# Patient Record
Sex: Female | Born: 1950
Health system: Southern US, Community
[De-identification: ages and names within clinical notes are randomized; demographics above are authoritative.]

## PROBLEM LIST (undated history)

## (undated) DIAGNOSIS — I10 Essential (primary) hypertension: Secondary | ICD-10-CM

## (undated) DIAGNOSIS — K219 Gastro-esophageal reflux disease without esophagitis: Secondary | ICD-10-CM

## (undated) DIAGNOSIS — D126 Benign neoplasm of colon, unspecified: Secondary | ICD-10-CM

## (undated) DIAGNOSIS — H269 Unspecified cataract: Secondary | ICD-10-CM

## (undated) DIAGNOSIS — E785 Hyperlipidemia, unspecified: Secondary | ICD-10-CM

## (undated) DIAGNOSIS — C801 Malignant (primary) neoplasm, unspecified: Secondary | ICD-10-CM

## (undated) DIAGNOSIS — J45909 Unspecified asthma, uncomplicated: Secondary | ICD-10-CM

## (undated) DIAGNOSIS — K52832 Lymphocytic colitis: Secondary | ICD-10-CM

## (undated) DIAGNOSIS — T7840XA Allergy, unspecified, initial encounter: Secondary | ICD-10-CM

## (undated) DIAGNOSIS — K5790 Diverticulosis of intestine, part unspecified, without perforation or abscess without bleeding: Secondary | ICD-10-CM

## (undated) HISTORY — PX: APPENDECTOMY: SHX54

## (undated) HISTORY — DX: Gastro-esophageal reflux disease without esophagitis: K21.9

## (undated) HISTORY — DX: Unspecified asthma, uncomplicated: J45.909

## (undated) HISTORY — DX: Lymphocytic colitis: K52.832

## (undated) HISTORY — DX: Malignant (primary) neoplasm, unspecified: C80.1

## (undated) HISTORY — PX: TONSILLECTOMY: SUR1361

## (undated) HISTORY — DX: Benign neoplasm of colon, unspecified: D12.6

## (undated) HISTORY — DX: Unspecified cataract: H26.9

## (undated) HISTORY — PX: SHOULDER SURGERY: SHX246

## (undated) HISTORY — DX: Hyperlipidemia, unspecified: E78.5

## (undated) HISTORY — DX: Diverticulosis of intestine, part unspecified, without perforation or abscess without bleeding: K57.90

## (undated) HISTORY — DX: Allergy, unspecified, initial encounter: T78.40XA

## (undated) HISTORY — PX: COLONOSCOPY: SHX174

## (undated) HISTORY — DX: Essential (primary) hypertension: I10

---

## 1999-04-29 ENCOUNTER — Encounter: Payer: Self-pay | Admitting: Internal Medicine

## 1999-04-29 ENCOUNTER — Ambulatory Visit (HOSPITAL_COMMUNITY): Admission: RE | Admit: 1999-04-29 | Discharge: 1999-04-29 | Payer: Self-pay | Admitting: Internal Medicine

## 1999-04-29 ENCOUNTER — Other Ambulatory Visit: Admission: RE | Admit: 1999-04-29 | Discharge: 1999-04-29 | Payer: Self-pay | Admitting: Obstetrics and Gynecology

## 1999-04-30 ENCOUNTER — Other Ambulatory Visit: Admission: RE | Admit: 1999-04-30 | Discharge: 1999-04-30 | Payer: Self-pay | Admitting: Obstetrics and Gynecology

## 1999-04-30 ENCOUNTER — Encounter (INDEPENDENT_AMBULATORY_CARE_PROVIDER_SITE_OTHER): Payer: Self-pay

## 1999-05-11 ENCOUNTER — Encounter: Payer: Self-pay | Admitting: Obstetrics and Gynecology

## 1999-05-13 ENCOUNTER — Encounter (INDEPENDENT_AMBULATORY_CARE_PROVIDER_SITE_OTHER): Payer: Self-pay | Admitting: Specialist

## 1999-05-13 ENCOUNTER — Inpatient Hospital Stay (HOSPITAL_COMMUNITY): Admission: RE | Admit: 1999-05-13 | Discharge: 1999-05-16 | Payer: Self-pay | Admitting: Obstetrics and Gynecology

## 1999-06-24 ENCOUNTER — Other Ambulatory Visit: Admission: RE | Admit: 1999-06-24 | Discharge: 1999-06-24 | Payer: Self-pay | Admitting: Obstetrics and Gynecology

## 1999-10-19 ENCOUNTER — Other Ambulatory Visit: Admission: RE | Admit: 1999-10-19 | Discharge: 1999-10-19 | Payer: Self-pay | Admitting: Obstetrics and Gynecology

## 1999-10-26 ENCOUNTER — Encounter (INDEPENDENT_AMBULATORY_CARE_PROVIDER_SITE_OTHER): Payer: Self-pay

## 1999-10-26 ENCOUNTER — Other Ambulatory Visit: Admission: RE | Admit: 1999-10-26 | Discharge: 1999-10-26 | Payer: Self-pay | Admitting: Gastroenterology

## 2000-02-17 ENCOUNTER — Other Ambulatory Visit: Admission: RE | Admit: 2000-02-17 | Discharge: 2000-02-17 | Payer: Self-pay | Admitting: Obstetrics and Gynecology

## 2000-07-18 ENCOUNTER — Other Ambulatory Visit: Admission: RE | Admit: 2000-07-18 | Discharge: 2000-07-18 | Payer: Self-pay | Admitting: Obstetrics and Gynecology

## 2001-01-03 HISTORY — PX: ABDOMINAL HYSTERECTOMY: SHX81

## 2001-01-03 HISTORY — PX: UPPER GASTROINTESTINAL ENDOSCOPY: SHX188

## 2001-05-25 ENCOUNTER — Other Ambulatory Visit: Admission: RE | Admit: 2001-05-25 | Discharge: 2001-05-25 | Payer: Self-pay | Admitting: Obstetrics and Gynecology

## 2001-08-02 ENCOUNTER — Ambulatory Visit (HOSPITAL_BASED_OUTPATIENT_CLINIC_OR_DEPARTMENT_OTHER): Admission: RE | Admit: 2001-08-02 | Discharge: 2001-08-03 | Payer: Self-pay | Admitting: Orthopedic Surgery

## 2003-06-05 ENCOUNTER — Ambulatory Visit (HOSPITAL_COMMUNITY): Admission: RE | Admit: 2003-06-05 | Discharge: 2003-06-05 | Payer: Self-pay | Admitting: Gastroenterology

## 2003-06-21 ENCOUNTER — Encounter: Admission: RE | Admit: 2003-06-21 | Discharge: 2003-06-21 | Payer: Self-pay | Admitting: Orthopedic Surgery

## 2003-07-14 ENCOUNTER — Encounter: Admission: RE | Admit: 2003-07-14 | Discharge: 2003-07-14 | Payer: Self-pay | Admitting: Orthopedic Surgery

## 2003-08-14 ENCOUNTER — Ambulatory Visit (HOSPITAL_BASED_OUTPATIENT_CLINIC_OR_DEPARTMENT_OTHER): Admission: RE | Admit: 2003-08-14 | Discharge: 2003-08-14 | Payer: Self-pay | Admitting: Orthopedic Surgery

## 2003-08-14 ENCOUNTER — Ambulatory Visit (HOSPITAL_COMMUNITY): Admission: RE | Admit: 2003-08-14 | Discharge: 2003-08-14 | Payer: Self-pay | Admitting: Orthopedic Surgery

## 2003-08-19 ENCOUNTER — Emergency Department (HOSPITAL_COMMUNITY): Admission: EM | Admit: 2003-08-19 | Discharge: 2003-08-19 | Payer: Self-pay | Admitting: Emergency Medicine

## 2004-05-14 ENCOUNTER — Ambulatory Visit: Payer: Self-pay | Admitting: Internal Medicine

## 2004-12-10 ENCOUNTER — Ambulatory Visit: Payer: Self-pay | Admitting: Internal Medicine

## 2005-01-27 ENCOUNTER — Ambulatory Visit: Payer: Self-pay | Admitting: Gastroenterology

## 2005-12-22 ENCOUNTER — Ambulatory Visit: Payer: Self-pay | Admitting: Internal Medicine

## 2005-12-22 LAB — CONVERTED CEMR LAB
Hgb A1c MFr Bld: 6.1 % — ABNORMAL HIGH (ref 4.6–6.0)
Potassium: 3.9 meq/L (ref 3.5–5.1)

## 2006-05-05 LAB — CONVERTED CEMR LAB: OCCULT 1: NEGATIVE

## 2006-05-06 LAB — CONVERTED CEMR LAB: OCCULT 3: NEGATIVE

## 2006-05-07 LAB — CONVERTED CEMR LAB: OCCULT 2: NEGATIVE

## 2006-05-15 ENCOUNTER — Ambulatory Visit: Payer: Self-pay | Admitting: Internal Medicine

## 2006-09-07 ENCOUNTER — Encounter: Payer: Self-pay | Admitting: Internal Medicine

## 2006-10-18 ENCOUNTER — Telehealth (INDEPENDENT_AMBULATORY_CARE_PROVIDER_SITE_OTHER): Payer: Self-pay | Admitting: *Deleted

## 2007-02-15 ENCOUNTER — Telehealth (INDEPENDENT_AMBULATORY_CARE_PROVIDER_SITE_OTHER): Payer: Self-pay | Admitting: *Deleted

## 2007-02-16 ENCOUNTER — Telehealth (INDEPENDENT_AMBULATORY_CARE_PROVIDER_SITE_OTHER): Payer: Self-pay | Admitting: *Deleted

## 2007-02-20 ENCOUNTER — Ambulatory Visit: Payer: Self-pay | Admitting: Internal Medicine

## 2007-02-20 DIAGNOSIS — E8881 Metabolic syndrome: Secondary | ICD-10-CM | POA: Insufficient documentation

## 2007-02-20 DIAGNOSIS — K209 Esophagitis, unspecified without bleeding: Secondary | ICD-10-CM | POA: Insufficient documentation

## 2007-02-20 DIAGNOSIS — E782 Mixed hyperlipidemia: Secondary | ICD-10-CM

## 2007-02-20 DIAGNOSIS — J45909 Unspecified asthma, uncomplicated: Secondary | ICD-10-CM

## 2007-02-20 LAB — CONVERTED CEMR LAB
Cholesterol, target level: 200 mg/dL
HDL goal, serum: 40 mg/dL

## 2007-05-31 ENCOUNTER — Encounter: Payer: Self-pay | Admitting: Internal Medicine

## 2007-10-30 ENCOUNTER — Ambulatory Visit: Payer: Self-pay | Admitting: Internal Medicine

## 2008-01-03 ENCOUNTER — Telehealth (INDEPENDENT_AMBULATORY_CARE_PROVIDER_SITE_OTHER): Payer: Self-pay | Admitting: *Deleted

## 2008-01-14 ENCOUNTER — Telehealth (INDEPENDENT_AMBULATORY_CARE_PROVIDER_SITE_OTHER): Payer: Self-pay | Admitting: *Deleted

## 2008-01-15 ENCOUNTER — Encounter: Payer: Self-pay | Admitting: Internal Medicine

## 2008-06-06 ENCOUNTER — Telehealth (INDEPENDENT_AMBULATORY_CARE_PROVIDER_SITE_OTHER): Payer: Self-pay | Admitting: *Deleted

## 2009-01-26 ENCOUNTER — Encounter (INDEPENDENT_AMBULATORY_CARE_PROVIDER_SITE_OTHER): Payer: Self-pay | Admitting: *Deleted

## 2009-01-27 ENCOUNTER — Ambulatory Visit: Payer: Self-pay | Admitting: Internal Medicine

## 2009-01-27 DIAGNOSIS — I10 Essential (primary) hypertension: Secondary | ICD-10-CM | POA: Insufficient documentation

## 2009-02-19 ENCOUNTER — Telehealth: Payer: Self-pay | Admitting: Internal Medicine

## 2010-01-24 ENCOUNTER — Encounter: Payer: Self-pay | Admitting: Orthopedic Surgery

## 2010-02-02 NOTE — Letter (Signed)
Summary: Primary Care Appointment Letter  [facility]  [address] [place], [place] [address]   Phone: [phone]  Fax: [phone]    01/26/2009 MRN: [id number]  [facility] [address] [place], [place]  [address]  Dear Ms. Merriweather,   Your Primary Care Physician Marga Melnick MD has indicated that:    ____X__it is time to schedule an appointment (Please call to schedule a Physical and fasting labs) An appointment is necessary to continue refilling medications.    _______you missed your appointment on______ and need to call and          reschedule.    _______you need to have lab work done.    _______you need to schedule an appointment discuss lab or test results.    _______you need to call to reschedule your appointment that is                       scheduled on _________.     Please call our office as soon as possible. Our phone number is [phone]. Please press option 1. Our office is open 8a-12noon and 1p-5p, Monday through Friday.     Thank you,     Primary Care Scheduler

## 2010-02-02 NOTE — Assessment & Plan Note (Signed)
Summary: MEDICATIONS RENEWAL/RH......   Vital Signs:  Patient profile:   60 year old female Height:      69 inches Weight:      289.8 pounds BMI:     42.95 Temp:     98.2 degrees F oral Pulse rate:   83 / minute Resp:     16 per minute BP sitting:   128 / 86  (left arm) Cuff size:   large  Vitals Entered By: Shonna Chock (January 27, 2009 3:33 PM) CC: Medication Renewal-patient refused EKG, Hypertension Management, Lipid Management, Abdominal Pain Comments REVIEWED MED LIST, PATIENT AGREED DOSE AND INSTRUCTION CORRECT    CC:  Medication Renewal-patient refused EKG, Hypertension Management, Lipid Management, and Abdominal Pain.  History of Present Illness: BP @  home 130+/ 87-88;she has been off ACE-I for 2 months. She takes ACE-i when work  stress elevates it. edema only with BP elevation vs due to prolonged sittiing due to work schedule. On no statins: "I don't want to". Last A1c 6.1% in 2007. No lipid assessment for > 4 years. No specific diet; walking 2-3X/week irregularly. Some EIB only with cold weather exposure. No rescue inhaler use > 1X/month.  Dyspepsia History:      There is a prior history of GERD.  The patient does not have a prior history of documented ulcer disease.  The dominant symptom is heartburn or acid reflux.  An H-2 blocker medication is not currently being taken.  She has no history of a positive H. Pylori serology.  A prior EGD has been done which showed moderate or severe esophagitis.    Hypertension History:      She denies headache, chest pain, palpitations, dyspnea with exertion, orthopnea, PND, peripheral edema, visual symptoms, neurologic problems, syncope, and side effects from treatment.  She notes no problems with any antihypertensive medication side effects.        Positive major cardiovascular risk factors include female age 18 years old or older, hyperlipidemia, hypertension, and family history for ischemic heart disease (males less than 18 years  old).  Negative major cardiovascular risk factors include no history of diabetes and non-tobacco-user status.        Further assessment for target organ damage reveals no history of ASHD, stroke/TIA, or peripheral vascular disease.    Lipid Management History:      Positive NCEP/ATP III risk factors include female age 59 years old or older, family history for ischemic heart disease (males less than 42 years old), and hypertension.  Negative NCEP/ATP III risk factors include no history of early menopause without estrogen hormone replacement, non-diabetic, non-tobacco-user status, no ASHD (atherosclerotic heart disease), no prior stroke/TIA, no peripheral vascular disease, and no history of aortic aneurysm.         Allergies: 1)  ! * Tetanus  Past History:  Past Medical History: colon polyps asthma Metabolic Syndrome GERD Hyperlipidemia: NMR 05/14/2004 : LDL 106 ( 1495/ 780), TG 183, HDL 41 Hypertension  Past Surgical History: Hysterectomy & BSO  for uterine cancer, 2003 colonoscopy 2004 : tics & polyps ( tubular adenomas)  ; colonoscopy negative  2008 in [place] Appendectomy Tonsillectomy shoulder surgery 2004,2006  Family History: father MI @ 51, congenital heart disease paternal GF & MGF  colon cancer P aunts  gyn cancer P aunt diabetes  Social History: Never Smoked but second hand smoke Alcohol use-no No diet Occupation:Appraiser  Review of Systems General:  Denies fatigue and weight loss. Eyes:  Denies blurring, double vision, and  vision loss-both eyes. ENT:  Denies difficulty swallowing and hoarseness. CV:  Denies leg cramps with exertion, lightheadness, and near fainting. Resp:  Denies cough, shortness of breath, sputum productive, and wheezing. GI:  Denies abdominal pain, bloody stools, dark tarry stools, and indigestion. Derm:  Denies lesion(s), poor wound healing, and rash. Neuro:  Denies poor balance and tingling; No burning. Psych:  Denies anxiety and  depression. Endo:  Denies cold intolerance, excessive hunger, excessive thirst, excessive urination, and heat intolerance.  Physical Exam  General:  well-nourished,in no acute distress; alert,appropriate and cooperative throughout examination; waist > 35 inches Eyes:  No corneal or conjunctival inflammation noted.  Perrla. No icterus Mouth:  Oral mucosa and oropharynx without lesions or exudates.  Teeth in good repair. no pharyngeal erythema.   Chest Wall:  No deformities, masses, or tenderness noted. Lungs:  Normal respiratory effort, chest expands symmetrically. Lungs are clear to auscultation, no crackles or wheezes. Heart:  Normal rate and regular rhythm. S1 and S2 normal without gallop, murmur, click, rub. S4 Abdomen:  Bowel sounds positive,abdomen soft and non-tender without masses, organomegaly or hernias noted. Protuberant Pulses:  R and L carotid,radial,dorsalis pedis and posterior tibial pulses are full and equal bilaterally Extremities:  No clubbing, cyanosis, edema. Neurologic:  alert & oriented X3 and sensation intact to light touch over feet.   Skin:  Intact without suspicious lesions or rashes Cervical Nodes:  No lymphadenopathy noted Axillary Nodes:  No palpable lymphadenopathy Psych:  memory intact for recent and remote, normally interactive, and good eye contact.     Impression & Recommendations:  Problem # 1:  HYPERTENSION (ICD-401.9) controlled Her updated medication list for this problem includes:    Benazepril Hcl 40 Mg Tabs (Benazepril hcl) .Marland Kitchen... Take 1 tab once daily goal = average bp < 135/85  Problem # 2:  ASTHMA (ICD-493.90) controlled ; cold induced EIB  Her updated medication list for this problem includes:    Maxair Autohaler 200 Mcg/inh Aerb (Pirbuterol acetate) .Marland Kitchen... 1-2 puffs q 4 hours prn    Qvar 80 Mcg/act Aers (Beclomethasone dipropionate) .Marland Kitchen... 2 puffs every 12 hrs  Problem # 3:  DYSMETABOLIC SYNDROME X (ICD-277.7) pre Diabetes  Problem # 4:   GERD (ICD-530.1) controlled with PPI Her updated medication list for this problem includes:    Aciphex 20 Mg Tbec (Rabeprazole sodium) .Marland Kitchen... 1 q am (failed omeprazole)  Complete Medication List: 1)  Benazepril Hcl 40 Mg Tabs (Benazepril hcl) .... Take 1 tab once daily goal = average bp < 135/85 2)  Flonase 50 Mcg/act Susp (Fluticasone propionate) .... Takes 1 spray each nostril qd 3)  Maxair Autohaler 200 Mcg/inh Aerb (Pirbuterol acetate) .Marland Kitchen.. 1-2 puffs q 4 hours prn 4)  Qvar 80 Mcg/act Aers (Beclomethasone dipropionate) .... 2 puffs every 12 hrs 5)  Aciphex 20 Mg Tbec (Rabeprazole sodium) .Marland Kitchen.. 1 q am (failed omeprazole)  Hypertension Assessment/Plan:      The patient's hypertensive risk group is category B: At least one risk factor (excluding diabetes) with no target organ damage.  Today's blood pressure is 128/86.    Lipid Assessment/Plan:      Based on NCEP/ATP III, the patient's risk factor category is "2 or more risk factors and a calculated 10 year CAD risk of > 20%".  The patient's lipid goals are as follows: Total cholesterol goal is 200; LDL cholesterol goal is 160; HDL cholesterol goal is 40; Triglyceride goal is 150.  Her LDL cholesterol goal has not been met.  Secondary causes for  hyperlipidemia have been ruled out.  She has been counseled on adjunctive measures for lowering her cholesterol and has been provided with dietary instructions.    Patient Instructions: 1)  Consume LESS THAN 25 grams of sugar / day from labeled foods & drinks with High Fructose Corn Syrup as #1, 2 or #3 on label. Please schedule fasting labs: 2)  BUN,creat, K+ , ICD-9: 401.9 3)  AST, ALT, ICD-9:995.20 4)  Lipid Panel, ICD-9:272.4 5)  HbgA1C , ICD-9:277.7 6)  Avoid foods high in acid (tomatoes, citrus juices, spicy foods). Avoid eating within two hours of lying down or before exercising. Do not over eat; try smaller more frequent meals. Elevate head of bed twelve inches when  sleeping. Prescriptions: ACIPHEX 20 MG TBEC (RABEPRAZOLE SODIUM) 1 q am (FAILED Omeprazole) Brand medically necessary #30 Tablet x 11   Entered and Authorized by:   Marga Melnick MD   Signed by:   Marga Melnick MD on 01/27/2009   Method used:   Print then Give to Patient   RxID:   [id number] QVAR 80 MCG/ACT AERS (BECLOMETHASONE DIPROPIONATE) 2 puffs every 12 hrs  #1 x 11   Entered and Authorized by:   Marga Melnick MD   Signed by:   Marga Melnick MD on 01/27/2009   Method used:   Print then Give to Patient   RxID:   [id number] MAXAIR AUTOHALER 200 MCG/INH  AERB (PIRBUTEROL ACETATE) 1-2 puffs q 4 hours prn  #1 x 3   Entered and Authorized by:   Marga Melnick MD   Signed by:   Marga Melnick MD on 01/27/2009   Method used:   Print then Give to Patient   RxID:   [id number] FLONASE 50 MCG/ACT  SUSP (FLUTICASONE PROPIONATE) takes 1 spray each nostril qd  #1 x 11   Entered and Authorized by:   Marga Melnick MD   Signed by:   Marga Melnick MD on 01/27/2009   Method used:   Print then Give to Patient   RxID:   [id number] BENAZEPRIL HCL 40 MG  TABS (BENAZEPRIL HCL) Take 1 tab once daily goal = AVERAGE BP < 135/85  #90 Tablet x 3   Entered and Authorized by:   Marga Melnick MD   Signed by:   Marga Melnick MD on 01/27/2009   Method used:   Print then Give to Patient   RxID:   [id number]

## 2010-02-02 NOTE — Progress Notes (Signed)
Summary: Triage: Request for ABX & Prednisone  Phone Note Call from Patient Call back at Home Phone [phone]   Caller: Patient Summary of Call: Onset last weekend patient was working in [place] and inhaled dirt/dust now bronchial tubes appear inflammed. Patient thinks there is some infection. Patient would like rx called in, patient said in the past she was rx'ed prednisone and ABX. Patient was offered an appointment and refused. Patient indicated that she is still trying to figure out how she is going to pay for her last visit and would really appreciate it if rx can be called in to pharmacy: [facility]  **Dr. Alwyn Ren please advise**  .Shonna Chock  February 19, 2009 9:59 AM   Follow-up for Phone Call        I'm sorry but I can not treat by phone; it is not a Standard of Care. I'll be glad to see her  or she can go to  [facility] or [address] Follow-up by: Marga Melnick MD,  February 19, 2009 1:35 PM  Additional Follow-up for Phone Call Additional follow up Details #1::        called pt back to inform pt of dr Leiya Keesey suggestion pt  refuse OV and did not given time for other alternatives to be suggested before hanging up phone............................Marland KitchenFelecia Deloach CMA  February 19, 2009 2:07 PM

## 2010-02-25 ENCOUNTER — Encounter: Payer: Self-pay | Admitting: Internal Medicine

## 2010-04-06 ENCOUNTER — Encounter: Payer: Self-pay | Admitting: Internal Medicine

## 2010-05-21 NOTE — Discharge Summary (Signed)
[facility]  Patient:    Sheila Tucker, Sheila Tucker                MRN: [id number] Adm. Date:  [id number] Disc. Date: [id number] Attending:  Malon Kindle                           Discharge Summary  DISCHARGE DIAGNOSES: 1. Adenocarcinoma of the endometrium. 2. Status post total abdominal hysterectomy and bilateral salpingo-    oophorectomy. 3. Status post removal of pelvic mass. 4. Acute urinary retention postoperatively.  DISCHARGE MEDICATIONS: Motrin 600 mg p.o. every 6h p.r.n. The patient will also resume her asthma medications as taken preoperatively.  FOLLOW-UP:  The patient is to follow-up in our office on May 16 for staple removal as well as possible removal of Foley catheter.  HOSPITAL COURSE:  The patients a 60 year old white female who was diagnosed with well differentiated adenocarcinoma of the endometrium based on endometrial biopsy performed a week prior to surgery. She was counseled appropriately and was admitted for a scheduled total abdominal hysterectomy and bilateral salpingo-oophorectomy and a possible lymph node dissection. The patient underwent this procedure on May 13, 1999 and at that time was also noted to have a well-differentiated mass in the prevesical space consistent with a lipoma; however, tissue was sent to pathology for final diagnosis. She was then admitted for routine postoperative care and did very well. On postoperative day #1, she was afebrile with stable vital signs. Her hematocrit was stable at 36.5 and her abdomen was soft and nontender. She had a trial of voiding and was unable to void, therefore, her Foley catheter was replaced. On postoperative day #2, she began to pass flatus. A Jackson-Pratt drain which had been placed in the prevesical space status post surgery was removed as it was draining less than 20 cc a shift. Again she continued to be afebrile with good vital signs. She had a second trial of  voiding which also failed and her Foley catheter was again replaced. On postoperative day #3, she was tolerating a regular diet, ambulating well. Her pain was well controlled. Her Tmax was 100.3. Abdomen was soft and nontender with good bowel sounds. Her incision was well approximated with staples and she had no erythema or exudate. She was to undergo a trial of voiding prior to discharge and is unable to void 4 hours status post catheter removal. She was to be discharged home with her Foley catheter and leave this in place until Wednesday, May 16 in the office. DD: 05/16/99 TD:  05/18/99 Job: [id number]

## 2010-05-21 NOTE — Consult Note (Signed)
[facility]  Patient:    Sheila Tucker, Sheila Tucker                MRN: [id number] Proc. Date: 05/13/99 Adm. Date:  [id number] Disc. Date: [id number] Attending:  Malon Kindle CC:         Malachi Pro. Ambrose Mantle, M.D.                          Consultation Report  SUBJECTIVE:  The patient is a 60 year old 250 pound female, found to have retropubic fatty mass upon incision for abdominal hysterectomy.  The patient has a history of carcinoma of the uterus and is now intraoperative for hysterectomy.  OBJECTIVE:  The bladder could not be identified.  The Foley catheter was felt to possibly be palpable.  The bladder was filled with saline, as well as indigo carmine given IV and Lasix.  The fatty mass was dissected off the bladder and appears to arise from the superpubic or retropubic area.  This is completely removed in two portions and following this, the bladder is palpable, and there s no evidence of leakage.  This patient then underwent hysterectomy without difficulty.  I believe the superpubic space of Retzius should be drained and this will be accomplished by Dr. Lang Snow. DD:  05/13/99 TD:  05/17/99 Job: [id number]

## 2010-05-21 NOTE — Op Note (Signed)
Sheila Tucker, Sheila Tucker                   ACCOUNT NO.:  [id number]   MEDICAL RECORD NO.:  [id number]                   PATIENT TYPE:  AMB   LOCATION:  DSC                                  FACILITY:  [facility]   PHYSICIAN:  Rodney A. Chaney Malling, M.D.           DATE OF BIRTH:  [birth year]   DATE OF PROCEDURE:  08/14/2003  DATE OF DISCHARGE:                                 OPERATIVE REPORT   PREOPERATIVE DIAGNOSIS:  Loose bodies right shoulder, early osteoarthritis,  possible other internal derangement.   POSTOPERATIVE DIAGNOSIS:  Severe osteoarthritis humeral head with loss of  significant articular cartilage, multiple loose bodies, torn labrum, right  shoulder.   OPERATION:  Debridement labrum, remove loose bodies, chondroplasty of  humeral head, removal of all flaking and torn cartilage off the humeral  head.   SURGEON:  Lenard Galloway. Chaney Malling, M.D.   ANESTHESIA:  General.   PROCEDURE:  The patient was placed on the operating table in supine  position.  After satisfactory oral endotracheal anesthesia, the patient was  placed in a semi-seated position.  The shoulder and left upper extremity was  prepped with DuraPrep and draped out in the usual manner.  The arthroscope  was introduced into the posterior portal and an anterior portal was made for  the operative instruments.  The patient had significant tearing and fraying  of the anterior labrum.  The biceps was also frayed.  There was grade 3  cartilage damage over the articular surface of the glenoid and the humeral  head had huge areas where there was total loss of articular cartilage.  The  junction of the articular cartilage to the raw area had flanking of  cartilage.  The intra-articular shaver was introduced.  The humeral head was  debrided with a chondroplasty shear.  All loose cartilage was debrided.  The  anterior labrum and under surface of the biceps was also debrided with the  intra-articular shaver.  The loose bodies in  the shoulder were removed.  There was one loose body in the inferior recess on a pedicle that was not  accessible but was not mobile and was stuck inferior in the nonarticular  portion of the joint.  A great deal of time was spent cleaning out debris  and frayed cartilage.  The shoulder was then filled with Marcaine, sterile  dressings were applied, and the patient returned to the recovery room in  excellent condition.  Technically, this procedure went extremely well.  Drains were none.  Complications none.                                               Rodney A. Chaney Malling, M.D.    RAM/MEDQ  D:  08/14/2003  T:  08/14/2003  Job:  [id number]

## 2010-05-21 NOTE — Op Note (Signed)
[facility]  Patient:    Sheila Tucker, Sheila Tucker                MRN: [id number] Proc. Date: 05/13/99 Adm. Date:  [id number] Disc. Date: [id number] Attending:  Malon Kindle CC:         Sigmund I. Patsi Sears, M.D.             Adolph Pollack, M.D.                           Operative Report  PREOPERATIVE DIAGNOSIS:  Well-differentiated adenocarcinoma of the endometrium.  POSTOPERATIVE DIAGNOSES:  Well-differentiated adenocarcinoma of the endometrium with superficial invasion of the myometrium by frozen section, well-differentiated tumor.  Large prevesical space lipoma.  OPERATORS: Malachi Pro. Ambrose Mantle, M.D. Sigmund I. Patsi Sears, M.D.  ASSISTANT:  Alvino Chapel, M.D.  ANESTHESIA:  General.  DESCRIPTION OF PROCEDURE:  Patient was brought to the operating room and placed under satisfactory general anesthesia.  She was placed in frog-leg position.  The abdomen, vulva, vagina and urethra was prepped with Betadine solution and draped as a sterile field.  Exam revealed a suggestion of a lower abdominal mass, although this had been thought to be present preoperatively and an ultrasound showed no pelvic masses.  Patient was then placed supine, after the uterus was thought to be anterior and normal size and the adnexa were free of masses.  The abdomen was prepped with Betadine solution all the way to the xiphoid in case an aortic node dissection was necessary.  The abdomen was draped as sterile field and a midline incision was made and carried in layers through the skin, subcutaneous tissue and fascia and then I attempted to enter the abdominal cavity; it was not easy to do so.  Finally, I did make an entry up close to the umbilicus and it appeared that the anterior pelvic peritoneum was actually close to the umbilicus.  The explanation for this was that there was a large fatty tumor that was arising from the prevesical space.  I dissected some  of this but did not persist in my efforts to dissect it because it was quite close to the bladder, so I called Dr. Patsi Sears, so for the next 25 minutes, I did not do any dissection and just waited for him to arrive from his surgery.  He scrubbed in and with blunt dissection, dissected this large fatty tumor away from its attachments and it seemed to be basically attached to the inner surface of the pubic symphysis. This mass was actually larger than a normal placenta at full-term.  There was minimal bleeding and now the bladder blade of the abdominal retractor could be used for exposure.  Packs were used to pack away the bowel.  The liver was smooth.  I thought I felt the gallbladder and it felt normal.  Both kidneys felt normal.  Exploration of the pelvis revealed the uterus to be anterior, probably three times normal size for a woman who had never been pregnant and was 60 years old.  Both ovaries appeared normal.  There seemed to be a corpus luteum on the left.  Both tubes appeared normal.  Pelvic washings were obtained and then the upper pedicles of the uterus were clamped across.  The infundibulopelvic ligaments bilaterally were suture-ligated twice and then the infundibulopelvic ligaments were cut.  The round ligaments bilaterally were cut, on the right with the Bovie, the left with suture; actually,  the left round ligament was secured with a suture after it had cut.  The bladder flap was developed, the bladder was pushed inferiorly, the parametrial and paracervical tissues were clamped, cut and suture-ligated.  It should be noted that Dr. Patsi Sears had had IV indigo carmine placed and then it filled the bladder up and there was no leakage.  The uterosacral ligaments were clamped, cut and suture-ligated and held.  The right vaginal angle was entered and then the uterus, tubes and ovaries were removed by transecting the upper vagina. Vaginal angle sutures were placed and then the  central portion of the cuff was closed with interrupted figure-of-eight sutures of 0 Vicryl.  Liberal irrigation confirmed a couple of bleeding points that were controlled with the Bovie.  I inspected both infundibulopelvic ligaments; the left was hemostatic; the right seemed to have a little bleeding, so I reclamped it and suture-ligated it and made it hemostatic.  The uterosacral ligaments were sutured together in the midline.  Reperitonealization was done across the vaginal cuff.  At this point, the uterine frozen section came back showing less than one-third invasion into the myometrium and well-differentiated tumor, so I called Dr. Adolph Pollack and asked him to no longer be on standby.  At this point, all packs and retractors were removed.  The peritoneum was closed with a running suture of 0 Vicryl.  Dr. Patsi Sears had requested that we place a Jackson-Pratt drainage tube in the prevesical space. I made a stab wound inferior to the incision in the left lower quadrant, placed a Kelly into the prevesical space and pulled the suction drain out through this.  I then began the closure of the fascia and I realized that the incision had gone in a little too close to the fascial edge on the left, so I replaced the subfascial drain farther away from the fascial edge.  I closed the abdominal wall fascia with multiple interrupted figure-of-eight sutures of #1 Novofil; the closure seemed to be quite good.  Liberal irrigation confirmed hemostasis in the subcutaneous space.  I closed the subcu tissue with a running 3-0 Vicryl and closed the skin with staples.  The patient seemed to tolerate the procedure well.  Blood loss was about 400 cc.  Sponge and needle counts were correct.  She was returned to recovery in satisfactory condition after the abdominal wall was cleansed of its Betadine, a sterile dressing was placed over the incision and the Jackson-Pratt tube had been hooked to  Hemovac drainage. DD:  05/13/99 TD:  05/17/99 Job: [id number]

## 2010-05-21 NOTE — H&P (Signed)
[facility]  Patient:    Sheila Tucker, Sheila Tucker                MRN: [id number] Adm. Date:  [id number] Attending:  Malon Kindle CC:         Titus Dubin. Alwyn Ren, M.D. [facility]             Adolph Pollack, M.D.                         History and Physical  HISTORY OF PRESENT ILLNESS:  This is a 60 year old white single female, para 0 admitted to the hospital for abdominal hysterectomy, bilateral salpingo-oophorectomy, possible lymph node biopsies because of well differentiated adenocarcinoma of the endometrium on endometrial biopsy. This patient states that she had normal periods until November of 2000. On November 18, 1998, she began bleeding and when I saw her on April 29, 1999, she stated that she had not missed a day without bleeding in the last 5 months. She was examined and there was a possibility of an abdominal mass. An endometrial biopsy was done that showed well differentiated FIGO grade 1 endometrial adenocarcinoma. She was advised hysterectomy and is admitted now for that procedure. She has never had a mammogram. She has only had sex 5 times in her life. Her last Pap had been 2 years prior to the examination. The Pap smear on April 26 was within normal limits. She has had asthma since age 39. She was placed on medications in her 30s.  PAST MEDICAL HISTORY:  She did have an appendectomy in childhood, tonsillectomy in childhood. Medical illnesses negative except for asthma. Until November of 2000, her periods had been 28 days apart lasting 6 to 7 days with moderate flow and no significant pain. She has no history of pregnancies.  ALLERGIES:  No known drug allergies.  MEDICATIONS:  Vanceril and Maxair.  FAMILY HISTORY:  Her father had a heart attack and heart disease and high blood pressure and cancer. Her mother had some type of nervous disease and at the present time her mother had dementia.  SOCIAL HISTORY:  The patient is single. 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  REVIEW OF SYSTEMS:  Positive for chronic respirator problems from asthma. She also claims to have urinary incontinence with a strong laugh. She reported this to me on the day of her preoperative examination, and I have told her that we will address that in the future that she will possibly need to undergo urologic studies, but we will not do anything about that at the time of the surgery.  PHYSICAL EXAMINATION:  VITAL SIGNS:  The patient is 5 feet 9 inches, 248 pounds. Blood pressure is 150/90.  HEENT:  Revealed no cranial abnormalities. Head, eyes, ears, nose and throat show the eyes to have good movements. The pharynx is clear. The teeth are in fair repair.  NECK:  Supple without thyromegaly.  HEART:  Normal size and sounds.  LUNGS:  Show occasion inspiratory wheezes. No significant expiratory wheezes. Air seems to move okay.  ABDOMEN:  Soft. It is obese. There is a suggestion of a lower abdominal mass but this did not prove to be true on ultrasound.  BREASTS:  Soft without masses lying down or sitting up.  PELVIC: The external genitalia are normal. There is no blood in the vagina today. The cervix is clean. The uterus is difficult to feel but thought to  be normal size. Adnexa are clear.  RECTAL:  On April 29, 1999 was negative.  ADMITTING IMPRESSION:  Well differentiated endometrial adenocarcinoma. Asthma. The patient is admitted for abdominal hysterectomy and bilateral salpingo-oophorectomy. If lymph node biopsies are indicated, Dr. Avel Peace will perform the lymph node biopsies. The patient understands the risks of surgery include but are not limited to heart attack, stroke, pulmonary embolus, wound disruption, hemorrhage with need for reoperation and/or transfusion, fistula formation, nerve injury, intestinal obstruction. She understands and agrees to proceed. DD:  05/12/99 TD:  05/13/99 Job:  [id number]

## 2010-05-21 NOTE — Op Note (Signed)
NAME:  Sheila Tucker, Sheila Tucker [facility]                   ACCOUNT NO.:  [id number]   MEDICAL RECORD NO.:  [id number]                   PATIENT TYPE:   LOCATION:                                       FACILITY:   PHYSICIAN:  Rodney A. Chaney Malling, M.D.           DATE OF BIRTH:   DATE OF PROCEDURE:  08/02/2001  DATE OF DISCHARGE:                                 OPERATIVE REPORT   PREOPERATIVE DIAGNOSIS:  Severe osteoarthritis left shoulder, multiple loose  bodies left shoulder, possible SLAP lesion left shoulder.   POSTOPERATIVE DIAGNOSIS:  Severe osteoarthritis humeral head with large  areas of total articular cartilage loss, multiple loose bodies left  shoulder; frayed biceps attachment left shoulder.   OPERATION:  Arthroscopy left shoulder, removal of multiple loose bodies,  chondroplasty of humeral head and removal of all loose cartilage from  humeral head, debridement of biceps attachment left shoulder.   SURGEON:  Lenard Galloway. Chaney Malling, M.D.   ASSISTANT:  Jamelle Rushing, P.A.   ANESTHESIA:  General.   DESCRIPTION OF PROCEDURE:  After satisfactory oral endotracheal anesthesia  the patient was placed on the operative table in a semi-seated position.  The left shoulder and upper extremity was prepped with Duraprep and draped  out in the usual manner.  A posterior portal was placed in the shoulder and  the arthroscope was introduced.  An operative cannula was placed anteriorly.  Extensive evaluation of the shoulder was then undertaken.  There were  multiple cartilaginous loose bodies and bony loose bodies in the shoulder.  There was marked fraying and tearing of the biceps attachment but the biceps  attachment was still stable.  This really represented a grade 1 SLAP lesion.  The articular cartilage above the glenoid was fairly normal but the humeral  head had a huge area of total loss of articular cartilage and the margins of  the articular cartilage reached the bare bone.  These cartilage  fragments  were loose.  The intraarticular shaver was introduced through the anterior  portal.  The loose bodies were the removed individually and completely  decompressed.  Using a full radius resector, the articular cartilage about  the humeral head at the margin of the articular and nonarticular portion was  debrided so all loose cartilage was removed.  A great deal of time was spent  debriding the shoulder.   Attention was then turned to the biceps.  The biceps was palpated with a  probe, and attachment was still stable with just marked fraying and tearing  of this attachment.  This was debrided with a full radius resector.  All  frayed portions of the biceps were removed.  Because of the extensive nature  of the osteoarthritis about the humeral head, it was felt that subacromial  decompression was probably not indicated.  The instruments were removed from  the shoulder, sutures used to close the wound.  Sterile dressings were  applied and the patient returned to the recovery  room in excellent  condition.  Technically, the procedure went extremely well.    ADDENDUM:  This patient probably will require a hemiarthroplasty in the  future.  The humeral head is in severe trouble, with large areas of loss of  articular cartilage although the glenoid still appears fairly adequate.                                               Rodney A. Chaney Malling, M.D.    RAM/MEDQ  D:  08/02/2001  T:  08/08/2001  Job:  [id number]

## 2010-07-09 ENCOUNTER — Ambulatory Visit (INDEPENDENT_AMBULATORY_CARE_PROVIDER_SITE_OTHER): Payer: BC Managed Care – PPO | Admitting: Internal Medicine

## 2010-07-09 ENCOUNTER — Encounter: Payer: Self-pay | Admitting: Internal Medicine

## 2010-07-09 VITALS — BP 124/86 | HR 88 | Temp 98.3°F | Resp 14 | Ht 69.0 in | Wt 278.2 lb

## 2010-07-09 DIAGNOSIS — K5790 Diverticulosis of intestine, part unspecified, without perforation or abscess without bleeding: Secondary | ICD-10-CM | POA: Insufficient documentation

## 2010-07-09 DIAGNOSIS — Z0000 Encounter for general adult medical examination without abnormal findings: Secondary | ICD-10-CM

## 2010-07-09 DIAGNOSIS — K635 Polyp of colon: Secondary | ICD-10-CM | POA: Insufficient documentation

## 2010-07-09 DIAGNOSIS — E8881 Metabolic syndrome: Secondary | ICD-10-CM

## 2010-07-09 DIAGNOSIS — K5730 Diverticulosis of large intestine without perforation or abscess without bleeding: Secondary | ICD-10-CM

## 2010-07-09 DIAGNOSIS — J45909 Unspecified asthma, uncomplicated: Secondary | ICD-10-CM

## 2010-07-09 DIAGNOSIS — D126 Benign neoplasm of colon, unspecified: Secondary | ICD-10-CM

## 2010-07-09 DIAGNOSIS — I10 Essential (primary) hypertension: Secondary | ICD-10-CM

## 2010-07-09 DIAGNOSIS — E782 Mixed hyperlipidemia: Secondary | ICD-10-CM

## 2010-07-09 LAB — HEPATIC FUNCTION PANEL
ALT: 28 U/L (ref 0–35)
AST: 24 U/L (ref 0–37)
Albumin: 4.1 g/dL (ref 3.5–5.2)
Alkaline Phosphatase: 49 U/L (ref 39–117)
Bilirubin, Direct: 0 mg/dL (ref 0.0–0.3)

## 2010-07-09 LAB — CBC WITH DIFFERENTIAL/PLATELET
Basophils Absolute: 0 10*3/uL (ref 0.0–0.1)
Basophils Relative: 0.4 % (ref 0.0–3.0)
Eosinophils Relative: 3.1 % (ref 0.0–5.0)
HCT: 45.4 % (ref 36.0–46.0)
Hemoglobin: 15.5 g/dL — ABNORMAL HIGH (ref 12.0–15.0)
Lymphocytes Relative: 25.3 % (ref 12.0–46.0)
Monocytes Absolute: 0.5 10*3/uL (ref 0.1–1.0)
Platelets: 224 10*3/uL (ref 150.0–400.0)
RBC: 5.08 Mil/uL (ref 3.87–5.11)
RDW: 14.5 % (ref 11.5–14.6)

## 2010-07-09 LAB — BASIC METABOLIC PANEL
BUN: 12 mg/dL (ref 6–23)
Calcium: 8.8 mg/dL (ref 8.4–10.5)
Creatinine, Ser: 0.8 mg/dL (ref 0.4–1.2)
Glucose, Bld: 108 mg/dL — ABNORMAL HIGH (ref 70–99)

## 2010-07-09 LAB — TSH: TSH: 0.92 u[IU]/mL (ref 0.35–5.50)

## 2010-07-09 LAB — LIPID PANEL
LDL Cholesterol: 88 mg/dL (ref 0–99)
Triglycerides: 160 mg/dL — ABNORMAL HIGH (ref 0.0–149.0)
VLDL: 32 mg/dL (ref 0.0–40.0)

## 2010-07-09 MED ORDER — PIRBUTEROL ACETATE 200 MCG/INH IN AERB: 2.0000 | INHALATION_SPRAY | Freq: Four times a day (QID) | RESPIRATORY_TRACT | Status: DC | PRN

## 2010-07-09 MED ORDER — BECLOMETHASONE DIPROPIONATE 80 MCG/ACT IN AERS: 2.0000 | INHALATION_SPRAY | RESPIRATORY_TRACT | Status: DC | PRN

## 2010-07-09 MED ORDER — FLUTICASONE PROPIONATE 50 MCG/ACT NA SUSP: 1.0000 | Freq: Every day | NASAL | Status: DC

## 2010-07-09 NOTE — Patient Instructions (Addendum)
Preventive Health Care: Exercise  30-45  minutes a day, 3-4 days a week. Walking is especially valuable in preventing Osteoporosis. Eat a low-fat diet with lots of fruits and vegetables, up to 7-9 servings per day. Avoid obesity; your goal = waist less than 35 inches.Consume less than 30 grams of sugar per day from foods & drinks with High Fructose Corn Syrup as #2,3 or #4 on label. Eye Doctor - have an eye exam @ least annually As per the Standard of Care , screening Colonoscopy recommended every 3-5 years  As per Gastroenterologist

## 2010-07-09 NOTE — Progress Notes (Signed)
Subjective:    Patient ID: Sheila Tucker, female    DOB: [birth year], 60 y.o.   MRN: [id number]  HPI  Ms. Sedam  is here for a physical; she denies acute issues.      Review of Systems Patient reports no vision/ hearing  changes, adenopathy,fever, weight change,  persistant / recurrent hoarseness , swallowing issues, chest pain,palpitations,persistant /recurrent cough, hemoptysis, dyspnea( rest/ exertional/paroxysmal nocturnal), gastrointestinal bleeding(melena, rectal bleeding), abdominal pain, significant heartburn,  bowel changes,GU symptoms(dysuria, hematuria,pyuria, incontinence) ), Gyn symptoms(abnormal  bleeding , pain),  syncope, focal weakness, memory loss,numbness & tingling, skin/hair /nail changes,abnormal bruising or bleeding, anxiety,or depression.  Edema only after sitting for period or with  [place] food. Some L knee pain after repetitive use;improving without  Rx     Objective:   Physical Exam Gen.: Healthy and well-nourished in appearance. Alert, appropriate and cooperative throughout exam. Head: Normocephalic without obvious abnormalities  Eyes: No corneal or conjunctival inflammation noted. Pupils equal round reactive to light and accommodation. Fundal exam is benign without hemorrhages, exudate, papilledema. Extraocular motion intact. Vision grossly normal with lenses. Ears: External  ear exam reveals no significant lesions or deformities. Canals clear .TMs normal. Hearing is grossly normal bilaterally. Nose: External nasal exam reveals no deformity or inflammation. Nasal mucosa are pink and moist. No lesions or exudates noted. Septum with slight dislocation Mouth: Oral mucosa and oropharynx reveal no lesions or exudates. Teeth in good repair. Neck: No deformities, masses, or tenderness noted. Range of motion & . Thyroid normal. Lungs: Normal respiratory effort; chest expands symmetrically. Lungs are clear to auscultation without rales, wheezes, or increased work of  breathing. Heart: Normal rate and rhythm. Normal S1 and S2. No gallop, click, or rub. S4 with slurring ; no murmur. Abdomen: Bowel sounds normal; abdomen soft and nontender. No masses, organomegaly or hernias noted. Genitalia: Dr Ambrose Mantle    .                                                                                   Musculoskeletal/extremities: No deformity or scoliosis noted of  the thoracic or lumbar spine but R thoracic muscles > L. No clubbing, cyanosis, edema, or deformity noted. Range of motion decreased @ L knee ; crepitus L > R knee.Tone & strength  normal.Joints normal. Nail health  good. Vascular: Carotid, radial artery, dorsalis pedis and  posterior tibial pulses are full and equal. No bruits present. Neurologic: Alert and oriented x3. Deep tendon reflexes symmetrical and normal.          Skin: Intact without suspicious lesions or rashes. Lymph: No cervical, axillary lymphadenopathy present. Psych: Mood and affect are normal. Normally interactive                                                                                         Assessment &  Plan:  #1 comprehensive physical exam; no acute findings #2 see Problem List with Assessments & Recommendations Plan: see Orders

## 2010-07-12 LAB — HEMOGLOBIN A1C: Hgb A1c MFr Bld: 6.4 % (ref 4.6–6.5)

## 2011-03-31 ENCOUNTER — Encounter: Payer: Self-pay | Admitting: Internal Medicine

## 2011-11-04 ENCOUNTER — Encounter: Payer: Self-pay | Admitting: Internal Medicine

## 2011-11-04 ENCOUNTER — Ambulatory Visit (INDEPENDENT_AMBULATORY_CARE_PROVIDER_SITE_OTHER): Payer: BC Managed Care – PPO | Admitting: Internal Medicine

## 2011-11-04 VITALS — BP 124/82 | HR 89 | Temp 98.3°F | Resp 14 | Wt 287.3 lb

## 2011-11-04 DIAGNOSIS — Z0000 Encounter for general adult medical examination without abnormal findings: Secondary | ICD-10-CM

## 2011-11-04 DIAGNOSIS — E8881 Metabolic syndrome: Secondary | ICD-10-CM

## 2011-11-04 DIAGNOSIS — J45909 Unspecified asthma, uncomplicated: Secondary | ICD-10-CM

## 2011-11-04 DIAGNOSIS — I10 Essential (primary) hypertension: Secondary | ICD-10-CM

## 2011-11-04 LAB — CBC WITH DIFFERENTIAL/PLATELET
Basophils Absolute: 0 10*3/uL (ref 0.0–0.1)
Basophils Relative: 0.4 % (ref 0.0–3.0)
Eosinophils Absolute: 0.3 10*3/uL (ref 0.0–0.7)
Eosinophils Relative: 3.9 % (ref 0.0–5.0)
Lymphocytes Relative: 28.6 % (ref 12.0–46.0)
Lymphs Abs: 2.1 10*3/uL (ref 0.7–4.0)
Monocytes Absolute: 0.5 10*3/uL (ref 0.1–1.0)
Monocytes Relative: 7.4 % (ref 3.0–12.0)
Neutrophils Relative %: 59.7 % (ref 43.0–77.0)
RBC: 5.67 Mil/uL — ABNORMAL HIGH (ref 3.87–5.11)
RDW: 14.2 % (ref 11.5–14.6)

## 2011-11-04 LAB — LIPID PANEL
Total CHOL/HDL Ratio: 6
Triglycerides: 188 mg/dL — ABNORMAL HIGH (ref 0.0–149.0)

## 2011-11-04 LAB — HEMOGLOBIN A1C: Hgb A1c MFr Bld: 6.2 % (ref 4.6–6.5)

## 2011-11-04 LAB — HEPATIC FUNCTION PANEL: Total Bilirubin: 0.7 mg/dL (ref 0.3–1.2)

## 2011-11-04 LAB — BASIC METABOLIC PANEL
Chloride: 98 mEq/L (ref 96–112)
Creatinine, Ser: 0.8 mg/dL (ref 0.4–1.2)
Glucose, Bld: 82 mg/dL (ref 70–99)

## 2011-11-04 LAB — LDL CHOLESTEROL, DIRECT: Direct LDL: 144.8 mg/dL

## 2011-11-04 MED ORDER — FLUTICASONE PROPIONATE 50 MCG/ACT NA SUSP: NASAL | Status: DC

## 2011-11-04 MED ORDER — PIRBUTEROL ACETATE 200 MCG/INH IN AERB: 2.0000 | INHALATION_SPRAY | Freq: Four times a day (QID) | RESPIRATORY_TRACT | Status: DC | PRN

## 2011-11-04 MED ORDER — BECLOMETHASONE DIPROPIONATE 80 MCG/ACT IN AERS: 2.0000 | INHALATION_SPRAY | Freq: Two times a day (BID) | RESPIRATORY_TRACT | Status: DC

## 2011-11-04 NOTE — Progress Notes (Signed)
  Subjective:    Patient ID: Sheila Tucker, female    DOB: April 16, 1950, 61 y.o.   MRN: [id number]  HPI  Sheila Tucker  is here for a physical; she denies acute issues.  She is  requesting refills of her rescue inhaler ; inhaled steroid as well as a nasal steroid. She is planning on getting married to a smoker and is concerned that this will lead to asthma exacerbations. Additionally she is planning on starting an exercise program She has actually been off his medications for several years. She has noticed that exercise particularly in cold environments has exacerbated the reactive airways disease.  She has had elevated triglycerides in the past; this has improved with dietary/nutrition on adherence. She has a history of colon polyps on multiple occasions. Her last colonoscopy was 2008. She is not had a followup is recommended because of insurance coverage. She paid $9000 a year for insurance and yet hasn't $5000 deductible.     Review of Systems HYPERTENSION: Disease Monitoring: Blood pressure range-no monitor  Chest pain, palpitations- no      Lightheadedness,Syncope- no    Edema- no  FASTING HYPERGLYCEMIA, PMH of:  Polyuria/phagia/dipsia- no      Visual problems- no  HYPERLIPIDEMIA: Disease Monitoring: See symptoms for Hypertension Medications: Compliance- never on statin   She denies unexplained weight loss, abdominal pain, melena, or rectal bleeding.     Objective:   Physical Exam Gen.: well-nourished in appearance. Alert, appropriate and cooperative throughout exam. Eyes: No corneal or conjunctival inflammation noted.  Extraocular motion intact.  Ears: External  ear exam reveals no significant lesions or deformities. Canals clear .TMs normal. Hearing is grossly normal bilaterally. Nose: External nasal exam reveals no deformity or inflammation. Nasal mucosa are pink and moist. No lesions or exudates noted.  Mouth: Oral mucosa and oropharynx reveal no lesions or exudates. Teeth in good  repair. Neck: No deformities, masses, or tenderness noted. Thyroid  normal. Lungs: Normal respiratory effort; chest expands symmetrically. Lungs are clear to auscultation without rales, wheezes, or increased work of breathing. Heart: Normal rate and rhythm. Normal S1 and S2. No gallop, click, or rub. S4 w/o murmur. Abdomen: Bowel sounds normal; abdomen soft and nontender. No masses, organomegaly or hernias noted. Genitalia:Dr Ambrose Mantle, Gyn Musculoskeletal/extremities: Slight lordosis noted of  the thoracic  spine. No clubbing, cyanosis, edema, or deformity noted. Tone & strength  normal.Joints normal. Nail health  good. Vascular: Carotid, radial artery, dorsalis pedis and  posterior tibial pulses are full and equal. No bruits present. Neurologic: Alert and oriented x3. Deep tendon reflexes symmetrical and normal.          Skin: Intact without suspicious lesions or rashes.Small keratotic lesion R external ear Lymph: No cervical, axillary lymphadenopathy present. Psych: Mood and affect are normal. Normally interactive                                                                                         Assessment & Plan:  #1 comprehensive physical exam; no acute findings #2 see Problem List with Assessments & Recommendations Plan: see Orders

## 2011-11-04 NOTE — Patient Instructions (Addendum)
Preventive Health Care: Exercise  30-45  minutes a day, 3-4 days a week. Walking is especially valuable in preventing Osteoporosis. Eat a low-fat diet with lots of fruits and vegetables, up to 7-9 servings per day. Consume less than 30 grams of sugar per day from foods & drinks with High Fructose Corn Syrup as #1,2,3 or #4 on label. Smoke detectors on every level of your home, check batteries every year. Eye Doctor - have an eye exam @ least annually. Blood Pressure Goal  Ideally is an AVERAGE < 135/85. This AVERAGE should be calculated from @ least 5-7 BP readings taken @ different times of day on different days of week. You should not respond to isolated BP readings , but rather the AVERAGE for that week   Please complete stool cards . Cheaper therapeutically equivalent prescription medication options may be available from  [facility] at [phone] or  [facility] [place] [phone] (toll-free).   If you activate My Chart; the results can be released to you as soon as they populate from the lab. If you choose not to use this program; the labs have to be reviewed, copied & mailed   causing a delay in getting the results to you.

## 2012-04-06 ENCOUNTER — Telehealth: Payer: Self-pay | Admitting: *Deleted

## 2012-04-06 DIAGNOSIS — Z0000 Encounter for general adult medical examination without abnormal findings: Secondary | ICD-10-CM

## 2012-04-06 DIAGNOSIS — Z1211 Encounter for screening for malignant neoplasm of colon: Secondary | ICD-10-CM

## 2012-04-06 NOTE — Telephone Encounter (Signed)
Order placed

## 2012-04-06 NOTE — Telephone Encounter (Signed)
Spoke with pt who is states she is overdue for colonoscopy. Woule like to go to Dr. Dulce Tucker w/[facility]. Her insurance runs out Jun 02, 2012 so needs it done by then. Patient needs it to be coded preventative care to be covered. Can we refer/schedule this?

## 2012-04-11 NOTE — Telephone Encounter (Signed)
Pt left VM that she left message on Friday with someone and has yet to hear back from anyone in reference to this issue. Called Pt back advise that per the documentation the order has been placed and someone should be in touch with her about appt info.

## 2012-07-30 ENCOUNTER — Ambulatory Visit: Payer: BC Managed Care – PPO | Admitting: Family Medicine

## 2012-07-30 VITALS — BP 130/78 | HR 93 | Temp 98.6°F | Resp 18 | Ht 68.0 in | Wt 289.0 lb

## 2012-07-30 DIAGNOSIS — N390 Urinary tract infection, site not specified: Secondary | ICD-10-CM

## 2012-07-30 DIAGNOSIS — R3915 Urgency of urination: Secondary | ICD-10-CM

## 2012-07-30 DIAGNOSIS — R350 Frequency of micturition: Secondary | ICD-10-CM

## 2012-07-30 LAB — POCT URINALYSIS DIPSTICK
Spec Grav, UA: <=1.005
Urobilinogen, UA: 0.2

## 2012-07-30 LAB — POCT UA - MICROSCOPIC ONLY
Casts, Ur, LPF, POC: NEGATIVE
Crystals, Ur, HPF, POC: NEGATIVE
Mucus, UA: POSITIVE
Yeast, UA: NEGATIVE

## 2012-07-30 MED ORDER — CIPROFLOXACIN HCL 500 MG PO TABS: 500.0000 mg | ORAL_TABLET | Freq: Two times a day (BID) | ORAL | Status: DC

## 2012-07-30 MED ORDER — PHENAZOPYRIDINE HCL 100 MG PO TABS: 100.0000 mg | ORAL_TABLET | Freq: Three times a day (TID) | ORAL | Status: DC | PRN

## 2012-07-30 NOTE — Patient Instructions (Addendum)
Urinary Tract Infection  Urinary tract infections (UTIs) can develop anywhere along your urinary tract. Your urinary tract is your body's drainage system for removing wastes and extra water. Your urinary tract includes two kidneys, two ureters, a bladder, and a urethra. Your kidneys are a pair of bean-shaped organs. Each kidney is about the size of your fist. They are located below your ribs, one on each side of your spine.  CAUSES  Infections are caused by microbes, which are microscopic organisms, including fungi, viruses, and bacteria. These organisms are so small that they can only be seen through a microscope. Bacteria are the microbes that most commonly cause UTIs.  SYMPTOMS   Symptoms of UTIs may vary by age and gender of the patient and by the location of the infection. Symptoms in young women typically include a frequent and intense urge to urinate and a painful, burning feeling in the bladder or urethra during urination. Older women and men are more likely to be tired, shaky, and weak and have muscle aches and abdominal pain. A fever may mean the infection is in your kidneys. Other symptoms of a kidney infection include pain in your back or sides below the ribs, nausea, and vomiting.  DIAGNOSIS  To diagnose a UTI, your caregiver will ask you about your symptoms. Your caregiver also will ask to provide a urine sample. The urine sample will be tested for bacteria and white blood cells. White blood cells are made by your body to help fight infection.  TREATMENT   Typically, UTIs can be treated with medication. Because most UTIs are caused by a bacterial infection, they usually can be treated with the use of antibiotics. The choice of antibiotic and length of treatment depend on your symptoms and the type of bacteria causing your infection.  HOME CARE INSTRUCTIONS   If you were prescribed antibiotics, take them exactly as your caregiver instructs you. Finish the medication even if you feel better after you  have only taken some of the medication.   Drink enough water and fluids to keep your urine clear or pale yellow.   Avoid caffeine, tea, and carbonated beverages. They tend to irritate your bladder.   Empty your bladder often. Avoid holding urine for long periods of time.   Empty your bladder before and after sexual intercourse.   After a bowel movement, women should cleanse from front to back. Use each tissue only once.  SEEK MEDICAL CARE IF:    You have back pain.   You develop a fever.   Your symptoms do not begin to resolve within 3 days.  SEEK IMMEDIATE MEDICAL CARE IF:    You have severe back pain or lower abdominal pain.   You develop chills.   You have nausea or vomiting.   You have continued burning or discomfort with urination.  MAKE SURE YOU:    Understand these instructions.   Will watch your condition.   Will get help right away if you are not doing well or get worse.  Document Released: 09/29/2004 Document Revised: 06/21/2011 Document Reviewed: 01/28/2011  [employer] Patient Information 2014 [employer]

## 2012-07-30 NOTE — Progress Notes (Signed)
[address]   [place], [place]  [address]   [id number]  Subjective:    Patient ID: Sheila Tucker, female    DOB: [birth year], 62 y.o.   MRN: [id number]  HPI This 62 y.o. female presents for evaluation of urinary symptoms, headaches.  Onset two days ago.  Similar symptoms.  Raging headache.  Urinary frequency, urgency, frequency.  No fever but +chills.  No sweats.  No hematuria.  +dysuria.  Urgency.  Frequency.  Nocturia x 8; baseline x 1.  No back pain.  No n/v.  No vaginal irritation; no vaginal discharge.  History of nephrolithiasis twenty years ago.    PCP: Alwyn Ren.  Review of Systems  Constitutional: Positive for chills. Negative for fever and diaphoresis.  Gastrointestinal: Negative for nausea, vomiting and diarrhea.  Genitourinary: Positive for dysuria, urgency and frequency. Negative for hematuria, flank pain, decreased urine volume, vaginal bleeding, vaginal discharge, genital sores and pelvic pain.  Neurological: Positive for headaches. Negative for dizziness, tremors, seizures, syncope, facial asymmetry, speech difficulty, weakness, light-headedness and numbness.    Past Medical History  Diagnosis Date  . Hypertension   . Hyperlipidemia   . Asthma   . Allergy   . Cancer     Past Surgical History  Procedure Laterality Date  . Appendectomy    . Tonsillectomy    . Shoulder surgery  2004/2006    both shoulders, Dr Chaney Malling  . Colonoscopy      tics and polyps (tubular adenomas), Neg in 2008  . Abdominal hysterectomy  2003    & BSO for uterine cancer  . Upper gastrointestinal endoscopy  2003    esophagitis, gastritis, duodenitis    Prior to Admission medications   Medication Sig Start Date End Date Taking? Authorizing Provider  beclomethasone (QVAR) 80 MCG/ACT inhaler Inhale 2 puffs into the lungs 2 (two) times daily. 2 puffs every 12 hours 11/04/11   Pecola Lawless, MD  ciprofloxacin (CIPRO) 500 MG tablet Take 1 tablet (500 mg total) by mouth 2 (two) times daily.  07/30/12   Ethelda Chick, MD  fluticasone (FLONASE) 50 MCG/ACT nasal spray 1 spray in each nostril twice a day as needed. Use the "crossover" technique as discussed 11/04/11   Pecola Lawless, MD  phenazopyridine (PYRIDIUM) 100 MG tablet Take 1 tablet (100 mg total) by mouth 3 (three) times daily as needed for pain. 07/30/12   Ethelda Chick, MD  pirbuterol (MAXAIR AUTOHALER) 200 MCG/INH inhaler Inhale 2 puffs into the lungs 4 (four) times daily as needed for wheezing. 11/04/11   Pecola Lawless, MD    Allergies  Allergen Reactions  . Tetanus Toxoid     REACTION: FEVER AND SWELLING    History   Social History  . Marital Status: Single    Spouse Name: N/A    Number of Children: N/A  . Years of Education: N/A   Occupational History  . Not on file.   Social History Main Topics  . Smoking status: Never Smoker   . Smokeless tobacco: Not on file     Comment: Patient never smoked, around second hand smoke  . Alcohol Use: No  . Drug Use: No  . Sexually Active: Not on file   Other Topics Concern  . Not on file   Social History Narrative  . No narrative on file    Family History  Problem Relation Age of Onset  . Heart attack Father 88    Congential Valvular Heart disease  .  Colon cancer Paternal Grandfather   . Colon cancer Maternal Grandfather   . Breast cancer Paternal Aunt   . Diabetes Paternal Aunt   . Colon cancer Paternal Uncle     X 2  . Stroke Neg Hx        Objective:   Physical Exam  Nursing note and vitals reviewed. Constitutional: She is oriented to person, place, and time. She appears well-developed and well-nourished. No distress.  Cardiovascular: Normal rate, regular rhythm and normal heart sounds.   Pulmonary/Chest: Effort normal and breath sounds normal.  Abdominal: Soft. Bowel sounds are normal. She exhibits no distension and no mass. There is tenderness in the suprapubic area. There is no rebound, no guarding and no CVA tenderness.  Neurological: She  is alert and oriented to person, place, and time.  Skin: Skin is warm and dry. No rash noted. She is not diaphoretic.  Psychiatric: She has a normal mood and affect. Her behavior is normal.      Results for orders placed in visit on 07/30/12  POCT UA - MICROSCOPIC ONLY      Result Value Range   WBC, Ur, HPF, POC tntc     RBC, urine, microscopic tntc     Bacteria, U Microscopic 3+     Mucus, UA pos     Epithelial cells, urine per micros 3-5     Crystals, Ur, HPF, POC neg     Casts, Ur, LPF, POC neg     Yeast, UA neg    POCT URINALYSIS DIPSTICK      Result Value Range   Color, UA yellow     Clarity, UA cloudy     Glucose, UA neg     Bilirubin, UA neg     Ketones, UA neg     Spec Grav, UA <=1.005     Blood, UA large     pH, UA 6.0     Protein, UA 30     Urobilinogen, UA 0.2     Nitrite, UA neg     Leukocytes, UA large (3+)      Assessment & Plan:  Frequency of urination - Plan: POCT UA - Microscopic Only, POCT urinalysis dipstick  Urgency of urination - Plan: POCT UA - Microscopic Only, POCT urinalysis dipstick  UTI (urinary tract infection)   1. UTI: New. Send culture urine; treat with Cipro 500mg  bid x 7-10 days; RTC for fever, vomiting, severe flank pain.  Recommend repeat u/a in 2 weeks to confirm blood resolved.  Meds ordered this encounter  Medications  . ciprofloxacin (CIPRO) 500 MG tablet    Sig: Take 1 tablet (500 mg total) by mouth 2 (two) times daily.    Dispense:  20 tablet    Refill:  0  . phenazopyridine (PYRIDIUM) 100 MG tablet    Sig: Take 1 tablet (100 mg total) by mouth 3 (three) times daily as needed for pain.    Dispense:  6 tablet    Refill:  0

## 2012-08-02 LAB — URINE CULTURE: Colony Count: >=100000

## 2012-08-03 ENCOUNTER — Encounter: Payer: Self-pay | Admitting: Family Medicine

## 2012-08-07 ENCOUNTER — Telehealth: Payer: Self-pay | Admitting: Radiology

## 2012-08-07 NOTE — Telephone Encounter (Signed)
Called patient, had unread message in my chart. She indicates she is not well yet. Advised her to return for repeat urinalysis, she will do this. She was transferred to make appt.

## 2012-11-08 ENCOUNTER — Other Ambulatory Visit: Payer: Self-pay

## 2013-04-16 ENCOUNTER — Ambulatory Visit: Payer: BC Managed Care – PPO | Admitting: Physician Assistant

## 2013-04-16 VITALS — BP 128/80 | HR 93 | Temp 98.8°F | Resp 16 | Ht 68.0 in | Wt 295.0 lb

## 2013-04-16 DIAGNOSIS — J45909 Unspecified asthma, uncomplicated: Secondary | ICD-10-CM

## 2013-04-16 DIAGNOSIS — R300 Dysuria: Secondary | ICD-10-CM

## 2013-04-16 DIAGNOSIS — N390 Urinary tract infection, site not specified: Secondary | ICD-10-CM

## 2013-04-16 LAB — POCT UA - MICROSCOPIC ONLY
Bacteria, U Microscopic: NEGATIVE
Casts, Ur, LPF, POC: NEGATIVE
Crystals, Ur, HPF, POC: NEGATIVE
Epithelial cells, urine per micros: NEGATIVE
Mucus, UA: NEGATIVE
Yeast, UA: NEGATIVE

## 2013-04-16 LAB — POCT URINALYSIS DIPSTICK
Bilirubin, UA: NEGATIVE
Glucose, UA: NEGATIVE
Ketones, UA: NEGATIVE
Nitrite, UA: POSITIVE
Protein, UA: 30
Spec Grav, UA: 1.02
Urobilinogen, UA: 0.2
pH, UA: 5.5

## 2013-04-16 MED ORDER — ALBUTEROL SULFATE HFA 108 (90 BASE) MCG/ACT IN AERS: 2.0000 | INHALATION_SPRAY | RESPIRATORY_TRACT | Status: DC | PRN

## 2013-04-16 MED ORDER — CIPROFLOXACIN HCL 500 MG PO TABS: 500.0000 mg | ORAL_TABLET | Freq: Two times a day (BID) | ORAL | Status: DC

## 2013-04-16 MED ORDER — BECLOMETHASONE DIPROPIONATE 80 MCG/ACT IN AERS: 2.0000 | INHALATION_SPRAY | Freq: Two times a day (BID) | RESPIRATORY_TRACT | Status: DC

## 2013-04-16 MED ORDER — PHENAZOPYRIDINE HCL 100 MG PO TABS: 100.0000 mg | ORAL_TABLET | Freq: Three times a day (TID) | ORAL | Status: DC | PRN

## 2013-04-16 NOTE — Progress Notes (Signed)
Subjective:    Patient ID: Sheila Tucker, female    DOB: February 10, 1950, 63 y.o.   MRN: [id number]  HPI 63 year old female presents for evaluation of acute onset of dysuria, urinary frequency, and suprapubic pressure. Symptoms started yesterday and have continued today. She has hx of UTI's, usually about 1x/year. Last episode in 07/2012 - treated successfully with Cipro. Culture showed E. Coli susceptible to everything. Denies fever, chills, nausea, vomiting, flank pain, or hematuria.    Hx of asthma treated with Maxair and Qvar. She does not use her Qvar regularly but has used Maxair with success as her rescue inhaler.  She is almost out of this and admits she they no longer make it so she will need a new type of rescue. PCP has been Dr. Unice Cobble who is retiring. She is searching for a new PCP.      Review of Systems  Constitutional: Negative for fever and chills.  Respiratory: Positive for cough, shortness of breath and wheezing.   Cardiovascular: Negative for chest pain.  Gastrointestinal: Positive for abdominal pain (suprapubic). Negative for nausea and vomiting.  Genitourinary: Positive for dysuria and frequency. Negative for flank pain.       Objective:   Physical Exam  Constitutional: She is oriented to person, place, and time. She appears well-developed and well-nourished.  HENT:  Head: Normocephalic and atraumatic.  Right Ear: External ear normal.  Left Ear: External ear normal.  Eyes: Conjunctivae are normal.  Neck: Normal range of motion.  Cardiovascular: Normal rate.   Pulmonary/Chest: Effort normal.  Abdominal: Soft. Bowel sounds are normal. There is tenderness in the suprapubic area. There is no rigidity, no guarding and no CVA tenderness.  Neurological: She is alert and oriented to person, place, and time.  Psychiatric: She has a normal mood and affect. Her behavior is normal. Judgment and thought content normal.      Results for orders placed in visit on  04/16/13  POCT URINALYSIS DIPSTICK      Result Value Ref Range   Color, UA yellow     Clarity, UA cloudy     Glucose, UA neg     Bilirubin, UA neg     Ketones, UA neg     Spec Grav, UA 1.020     Blood, UA large     pH, UA 5.5     Protein, UA 30     Urobilinogen, UA 0.2     Nitrite, UA positive     Leukocytes, UA moderate (2+)    POCT UA - MICROSCOPIC ONLY      Result Value Ref Range   WBC, Ur, HPF, POC TNTC     RBC, urine, microscopic TNTC     Bacteria, U Microscopic neg     Mucus, UA neg     Epithelial cells, urine per micros neg     Crystals, Ur, HPF, POC neg     Casts, Ur, LPF, POC neg     Yeast, UA neg         Assessment & Plan:  UTI (urinary tract infection) - Plan: ciprofloxacin (CIPRO) 500 MG tablet, phenazopyridine (PYRIDIUM) 100 MG tablet  Dysuria - Plan: POCT urinalysis dipstick, POCT UA - Microscopic Only, phenazopyridine (PYRIDIUM) 100 MG tablet  ASTHMA - Plan: albuterol (PROVENTIL HFA;VENTOLIN HFA) 108 (90 BASE) MCG/ACT inhaler, beclomethasone (QVAR) 80 MCG/ACT inhaler  Patient declined Urine culture due to cost but understands she will need to return for recheck if symptoms do not improve.  Treatment extended to 10 days due to delay in improvement so will repeat this course again today. Start cipro 500 mg bid x 10 days. Pyridium 100 mg q8hours prn pain. Increase fluids. RTC precautions discussed. Recheck if symptoms worsen or fail to improve.

## 2013-05-16 ENCOUNTER — Encounter: Payer: Self-pay | Admitting: Internal Medicine

## 2013-10-22 ENCOUNTER — Ambulatory Visit (INDEPENDENT_AMBULATORY_CARE_PROVIDER_SITE_OTHER): Payer: BC Managed Care – PPO | Admitting: Family Medicine

## 2013-10-22 VITALS — BP 132/84 | HR 88 | Temp 98.7°F | Resp 22 | Ht 68.5 in | Wt 307.2 lb

## 2013-10-22 DIAGNOSIS — J4541 Moderate persistent asthma with (acute) exacerbation: Secondary | ICD-10-CM

## 2013-10-22 MED ORDER — ALBUTEROL SULFATE (2.5 MG/3ML) 0.083% IN NEBU
2.5000 mg | INHALATION_SOLUTION | Freq: Once | RESPIRATORY_TRACT | Status: AC
Start: 2013-10-22 — End: 2013-10-22
  Administered 2013-10-22: 2.5 mg via RESPIRATORY_TRACT

## 2013-10-22 MED ORDER — FLUTICASONE FUROATE 200 MCG/ACT IN AEPB: 200.0000 ug | INHALATION_SPRAY | Freq: Every day | RESPIRATORY_TRACT | Status: DC

## 2013-10-22 MED ORDER — PREDNISONE 20 MG PO TABS: 40.0000 mg | ORAL_TABLET | Freq: Every day | ORAL | Status: DC

## 2013-10-22 NOTE — Patient Instructions (Addendum)
I would recommend seeing the pulmonologist Dr. Melvyn Novas with [facility]. Let me know if you want me to place a referral for this. Asthma, Acute Bronchospasm Acute bronchospasm caused by asthma is also referred to as an asthma attack. Bronchospasm means your air passages become narrowed. The narrowing is caused by inflammation and tightening of the muscles in the air tubes (bronchi) in your lungs. This can make it hard to breathe or cause you to wheeze and cough. CAUSES Possible triggers are:  Animal dander from the skin, hair, or feathers of animals.  Dust mites contained in house dust.  Cockroaches.  Pollen from trees or grass.  Mold.  Cigarette or tobacco smoke.  Air pollutants such as dust, household cleaners, hair sprays, aerosol sprays, paint fumes, strong chemicals, or strong odors.  Cold air or weather changes. Cold air may trigger inflammation. Winds increase molds and pollens in the air.  Strong emotions such as crying or laughing hard.  Stress.  Certain medicines such as aspirin or beta-blockers.  Sulfites in foods and drinks, such as dried fruits and wine.  Infections or inflammatory conditions, such as a flu, cold, or inflammation of the nasal membranes (rhinitis).  Gastroesophageal reflux disease (GERD). GERD is a condition where stomach acid backs up into your esophagus.  Exercise or strenuous activity. SIGNS AND SYMPTOMS   Wheezing.  Excessive coughing, particularly at night.  Chest tightness.  Shortness of breath. DIAGNOSIS  Your health care provider will ask you about your medical history and perform a physical exam. A chest X-ray or blood testing may be performed to look for other causes of your symptoms or other conditions that may have triggered your asthma attack. TREATMENT  Treatment is aimed at reducing inflammation and opening up the airways in your lungs. Most asthma attacks are treated with inhaled medicines. These include quick relief  or rescue medicines (such as bronchodilators) and controller medicines (such as inhaled corticosteroids). These medicines are sometimes given through an inhaler or a nebulizer. Systemic steroid medicine taken by mouth or given through an IV tube also can be used to reduce the inflammation when an attack is moderate or severe. Antibiotic medicines are only used if a bacterial infection is present.  HOME CARE INSTRUCTIONS   Rest.  Drink plenty of liquids. This helps the mucus to remain thin and be easily coughed up. Only use caffeine in moderation and do not use alcohol until you have recovered from your illness.  Do not smoke. Avoid being exposed to secondhand smoke.  You play a critical role in keeping yourself in good health. Avoid exposure to things that cause you to wheeze or to have breathing problems.  Keep your medicines up-to-date and available. Carefully follow your health care provider's treatment plan.  Take your medicine exactly as prescribed.  When pollen or pollution is bad, keep windows closed and use an air conditioner or go to places with air conditioning.  Asthma requires careful medical care. See your health care provider for a follow-up as advised. If you are more than [redacted] weeks pregnant and you were prescribed any new medicines, let your obstetrician know about the visit and how you are doing. Follow up with your health care provider as directed.  After you have recovered from your asthma attack, make an appointment with your outpatient doctor to talk about ways to reduce the likelihood of future attacks. If you do not have a doctor who manages your asthma, make an appointment with a primary care doctor to  discuss your asthma. SEEK IMMEDIATE MEDICAL CARE IF:   You are getting worse.  You have trouble breathing. If severe, call your local emergency services (911 in the [place]  You develop chest pain or discomfort.  You are vomiting.  You are not able to keep fluids  down.  You are coughing up yellow, green, brown, or bloody sputum.  You have a fever and your symptoms suddenly get worse.  You have trouble swallowing. MAKE SURE YOU:   Understand these instructions.  Will watch your condition.  Will get help right away if you are not doing well or get worse. Document Released: 04/06/2006 Document Revised: 12/25/2012 Document Reviewed: 06/27/2012 [facility] Patient Information 2015 [place], [place]. This information is not intended to replace advice given to you by your health care provider. Make sure you discuss any questions you have with your health care provider. Asthma Attack Prevention Although there is no way to prevent asthma from starting, you can take steps to control the disease and reduce its symptoms. Learn about your asthma and how to control it. Take an active role to control your asthma by working with your health care provider to create and follow an asthma action plan. An asthma action plan guides you in:  Taking your medicines properly.  Avoiding things that set off your asthma or make your asthma worse (asthma triggers).  Tracking your level of asthma control.  Responding to worsening asthma.  Seeking emergency care when needed. To track your asthma, keep records of your symptoms, check your peak flow number using a handheld device that shows how well air moves out of your lungs (peak flow meter), and get regular asthma checkups.  WHAT ARE SOME WAYS TO PREVENT AN ASTHMA ATTACK?  Take medicines as directed by your health care provider.  Keep track of your asthma symptoms and level of control.  With your health care provider, write a detailed plan for taking medicines and managing an asthma attack. Then be sure to follow your action plan. Asthma is an ongoing condition that needs regular monitoring and treatment.  Identify and avoid asthma triggers. Many outdoor allergens and irritants (such as pollen, mold, cold air, and air  pollution) can trigger asthma attacks. Find out what your asthma triggers are and take steps to avoid them.  Monitor your breathing. Learn to recognize warning signs of an attack, such as coughing, wheezing, or shortness of breath. Your lung function may decrease before you notice any signs or symptoms, so regularly measure and record your peak airflow with a home peak flow meter.  Identify and treat attacks early. If you act quickly, you are less likely to have a severe attack. You will also need less medicine to control your symptoms. When your peak flow measurements decrease and alert you to an upcoming attack, take your medicine as instructed and immediately stop any activity that may have triggered the attack. If your symptoms do not improve, get medical help.  Pay attention to increasing quick-relief inhaler use. If you find yourself relying on your quick-relief inhaler, your asthma is not under control. See your health care provider about adjusting your treatment. WHAT CAN MAKE MY SYMPTOMS WORSE? A number of common things can set off or make your asthma symptoms worse and cause temporary increased inflammation of your airways. Keep track of your asthma symptoms for several weeks, detailing all the environmental and emotional factors that are linked with your asthma. When you have an asthma attack, go back to your asthma  diary to see which factor, or combination of factors, might have contributed to it. Once you know what these factors are, you can take steps to control many of them. If you have allergies and asthma, it is important to take asthma prevention steps at home. Minimizing contact with the substance to which you are allergic will help prevent an asthma attack. Some triggers and ways to avoid these triggers are: Animal Dander:  Some people are allergic to the flakes of skin or dried saliva from animals with fur or feathers.   There is no such thing as a hypoallergenic dog or cat breed.  All dogs or cats can cause allergies, even if they don't shed.  Keep these pets out of your home.  If you are not able to keep a pet outdoors, keep the pet out of your bedroom and other sleeping areas at all times, and keep the door closed.  Remove carpets and furniture covered with cloth from your home. If that is not possible, keep the pet away from fabric-covered furniture and carpets. Dust Mites: Many people with asthma are allergic to dust mites. Dust mites are tiny bugs that are found in every home in mattresses, pillows, carpets, fabric-covered furniture, bedcovers, clothes, stuffed toys, and other fabric-covered items.   Cover your mattress in a special dust-proof cover.  Cover your pillow in a special dust-proof cover, or wash the pillow each week in hot water. Water must be hotter than 130 F (54.4 C) to kill dust mites. Cold or warm water used with detergent and bleach can also be effective.  Wash the sheets and blankets on your bed each week in hot water.  Try not to sleep or lie on cloth-covered cushions.  Call ahead when traveling and ask for a smoke-free hotel room. Bring your own bedding and pillows in case the hotel only supplies feather pillows and down comforters, which may contain dust mites and cause asthma symptoms.  Remove carpets from your bedroom and those laid on concrete, if you can.  Keep stuffed toys out of the bed, or wash the toys weekly in hot water or cooler water with detergent and bleach. Cockroaches: Many people with asthma are allergic to the droppings and remains of cockroaches.   Keep food and garbage in closed containers. Never leave food out.  Use poison baits, traps, powders, gels, or paste (for example, boric acid).  If a spray is used to kill cockroaches, stay out of the room until the odor goes away. Indoor Mold:  Fix leaky faucets, pipes, or other sources of water that have mold around them.  Clean floors and moldy surfaces with a  fungicide or diluted bleach.  Avoid using humidifiers, vaporizers, or swamp coolers. These can spread molds through the air. Pollen and Outdoor Mold:  When pollen or mold spore counts are high, try to keep your windows closed.  Stay indoors with windows closed from late morning to afternoon. Pollen and some mold spore counts are highest at that time.  Ask your health care provider whether you need to take anti-inflammatory medicine or increase your dose of the medicine before your allergy season starts. Other Irritants to Avoid:  Tobacco smoke is an irritant. If you smoke, ask your health care provider how you can quit. Ask family members to quit smoking, too. Do not allow smoking in your home or car.  If possible, do not use a wood-burning stove, kerosene heater, or fireplace. Minimize exposure to all sources of smoke, including  incense, candles, fires, and fireworks.  Try to stay away from strong odors and sprays, such as perfume, talcum powder, hair spray, and paints.  Decrease humidity in your home and use an indoor air cleaning device. Reduce indoor humidity to below 60%. Dehumidifiers or central air conditioners can do this.  Decrease house dust exposure by changing furnace and air cooler filters frequently.  Try to have someone else vacuum for you once or twice a week. Stay out of rooms while they are being vacuumed and for a short while afterward.  If you vacuum, use a dust mask from a hardware store, a double-layered or microfilter vacuum cleaner bag, or a vacuum cleaner with a HEPA filter.  Sulfites in foods and beverages can be irritants. Do not drink beer or wine or eat dried fruit, processed potatoes, or shrimp if they cause asthma symptoms.  Cold air can trigger an asthma attack. Cover your nose and mouth with a scarf on cold or windy days.  Several health conditions can make asthma more difficult to manage, including a runny nose, sinus infections, reflux disease,  psychological stress, and sleep apnea. Work with your health care provider to manage these conditions.  Avoid close contact with people who have a respiratory infection such as a cold or the flu, since your asthma symptoms may get worse if you catch the infection. Wash your hands thoroughly after touching items that may have been handled by people with a respiratory infection.  Get a flu shot every year to protect against the flu virus, which often makes asthma worse for days or weeks. Also get a pneumonia shot if you have not previously had one. Unlike the flu shot, the pneumonia shot does not need to be given yearly. Medicines:  Talk to your health care provider about whether it is safe for you to take aspirin or non-steroidal anti-inflammatory medicines (NSAIDs). In a small number of people with asthma, aspirin and NSAIDs can cause asthma attacks. These medicines must be avoided by people who have known aspirin-sensitive asthma. It is important that people with aspirin-sensitive asthma read labels of all over-the-counter medicines used to treat pain, colds, coughs, and fever.  Beta-blockers and ACE inhibitors are other medicines you should discuss with your health care provider. HOW CAN I FIND OUT WHAT I AM ALLERGIC TO? Ask your asthma health care provider about allergy skin testing or blood testing (the RAST test) to identify the allergens to which you are sensitive. If you are found to have allergies, the most important thing to do is to try to avoid exposure to any allergens that you are sensitive to as much as possible. Other treatments for allergies, such as medicines and allergy shots (immunotherapy) are available.  CAN I EXERCISE? Follow your health care provider's advice regarding asthma treatment before exercising. It is important to maintain a regular exercise program, but vigorous exercise or exercise in cold, humid, or dry environments can cause asthma attacks, especially for those people  who have exercise-induced asthma. Document Released: 12/08/2008 Document Revised: 12/25/2012 Document Reviewed: 06/27/2012 [facility] Patient Information 2015 [place], [place]. This information is not intended to replace advice given to you by your health care provider. Make sure you discuss any questions you have with your health care provider.

## 2013-10-22 NOTE — Progress Notes (Signed)
Subjective:    Patient ID: Sheila Tucker, female    DOB: [birth year], 63 y.o.   MRN: [id number] Chief Complaint  Patient presents with  . Shortness of Breath    pt states she is having shortness of breath for a couple of days now; has tried using inhalers with no relief    HPI  About every 4 years she has a very severe asthma attack. Maxair always worked better for her but now that it is off the market her asthma has not been as well controlled - gets flaired up easily by allergens - she is more DOE and wheezing more, esp at night.  Does not have a cough, does not feel ill. She has tried different forms of the new albuterol inhalers but Ventolin was the best of those so she is using that now.  Did use it twice today - got a bad HA. PCP is Dr. Linna Tucker with  Is using Qvar 1 puff twice a day.  Past Medical History  Diagnosis Date  . Hypertension   . Hyperlipidemia   . Asthma   . Allergy   . Cancer    Current Outpatient Prescriptions on File Prior to Visit  Medication Sig Dispense Refill  . albuterol (PROVENTIL HFA;VENTOLIN HFA) 108 (90 BASE) MCG/ACT inhaler Inhale 2 puffs into the lungs every 4 (four) hours as needed for wheezing or shortness of breath (cough, shortness of breath or wheezing.).  1 Inhaler  1   No current facility-administered medications on file prior to visit.   Allergies  Allergen Reactions  . Tetanus Toxoid     REACTION: FEVER AND SWELLING     Review of Systems  Constitutional: Positive for fatigue. Negative for fever, chills, diaphoresis and activity change.  HENT: Positive for congestion. Negative for rhinorrhea and sore throat.   Respiratory: Positive for chest tightness, shortness of breath and wheezing. Negative for cough.   Cardiovascular: Negative for chest pain, palpitations and leg swelling.  Musculoskeletal: Negative for arthralgias and back pain.  Neurological: Positive for headaches.  Hematological: Negative for adenopathy.    Psychiatric/Behavioral: Positive for sleep disturbance.       Objective:  Pulse 88  Temp(Src) 98.7 F (37.1 C) (Oral)  Resp 22  Ht 5' 8.5" (1.74 m)  Wt 307 lb 3.2 oz (139.345 kg)  BMI 46.02 kg/m2  SpO2 96%  Physical Exam  Constitutional: She is oriented to person, place, and time. She appears well-developed and well-nourished. No distress.  HENT:  Head: Normocephalic and atraumatic.  Right Ear: External ear normal.  Left Ear: External ear normal.  Nose: Nose normal.  Eyes: Conjunctivae are normal. Right eye exhibits no discharge. Left eye exhibits no discharge. No scleral icterus.  Neck: Neck supple. No thyromegaly present.  Cardiovascular: Normal rate, regular rhythm and normal heart sounds.   Pulmonary/Chest: Effort normal. No accessory muscle usage. No respiratory distress. She has no decreased breath sounds. She has wheezes in the right lower field, the left upper field and the left lower field. She has rhonchi in the right lower field and the left lower field. She has no rales.  Not wearing oxygen. Talks in complete sentences and walks through clinic w/o apparent dyspnea.  Musculoskeletal: She exhibits no edema and no tenderness.  Lymphadenopathy:    She has no cervical adenopathy.  Neurological: She is alert and oriented to person, place, and time.  Skin: Skin is warm and dry. She is not diaphoretic. No erythema.  Psychiatric: She has a normal  mood and affect. Her behavior is normal.          Assessment & Plan:   Asthma, moderate persistent, with acute exacerbation - Plan: albuterol (PROVENTIL) (2.5 MG/3ML) 0.083% nebulizer solution 2.5 mg, Ambulatory referral to Pulmonology - pt would like to establish w/ a [place] pulmonologist - referred to Dr. Melvyn Tucker  Meds ordered this encounter  Medications  . DISCONTD: Fluticasone Furoate (ARNUITY ELLIPTA) 200 MCG/ACT AEPB    Sig: Inhale 200 mcg into the lungs daily.    Dispense:  30 each    Refill:  11  . DISCONTD:  predniSONE (DELTASONE) 20 MG tablet    Sig: Take 2 tablets (40 mg total) by mouth daily with breakfast.    Dispense:  10 tablet    Refill:  0  . albuterol (PROVENTIL) (2.5 MG/3ML) 0.083% nebulizer solution 2.5 mg    Sig:   . DISCONTD: predniSONE (DELTASONE) 20 MG tablet    Sig: Take 2 tablets (40 mg total) by mouth daily with breakfast.    Dispense:  10 tablet    Refill:  0     Sheila Cheadle, MD MPH

## 2013-10-28 ENCOUNTER — Ambulatory Visit (INDEPENDENT_AMBULATORY_CARE_PROVIDER_SITE_OTHER): Payer: BC Managed Care – PPO | Admitting: Internal Medicine

## 2013-10-28 ENCOUNTER — Encounter: Payer: Self-pay | Admitting: Internal Medicine

## 2013-10-28 ENCOUNTER — Ambulatory Visit (INDEPENDENT_AMBULATORY_CARE_PROVIDER_SITE_OTHER)
Admission: RE | Admit: 2013-10-28 | Discharge: 2013-10-28 | Disposition: A | Discharge status: Home / Self Care | Payer: BC Managed Care – PPO | Source: Ambulatory Visit | Attending: Internal Medicine | Admitting: Internal Medicine

## 2013-10-28 VITALS — BP 136/62 | HR 80 | Temp 98.8°F | Ht 69.0 in | Wt 305.0 lb

## 2013-10-28 DIAGNOSIS — R0600 Dyspnea, unspecified: Secondary | ICD-10-CM

## 2013-10-28 DIAGNOSIS — J4540 Moderate persistent asthma, uncomplicated: Secondary | ICD-10-CM

## 2013-10-28 MED ORDER — PREDNISONE 10 MG PO TABS: ORAL_TABLET | ORAL | Status: DC

## 2013-10-28 NOTE — Patient Instructions (Addendum)
Continue qvar Take 2 puffs first thing in am and then another 2 puffs about 12 hours later.   Prednisone 10 mg take  4 each am x 2 days,   2 each am x 2 days,  1 each am x 2 days and stop   Only use your albuterol as a rescue medication to be used if you can't catch your breath by resting or doing a relaxed purse lip breathing pattern.  - The less you use it, the better it will work when you need it. - Ok to use up to 2 puffs  every 4 hours if you must but call for immediate appointment if use goes up over your usual need - Don't leave home without it !!  (think of it like the spare tire for your car)   Work on perfecting  inhaler technique:  relax and gently blow all the way out then take a nice smooth deep breath back in, triggering the inhaler at same time you start breathing in.  Hold for up to 5 seconds if you can.  Rinse and gargle with water when done  Please remember to go to the lab and x-ray department downstairs for your tests - we will call you with the results when they are available.     Please schedule a follow up office visit in 4 weeks, sooner if needed

## 2013-10-28 NOTE — Progress Notes (Signed)
   Subjective:    Patient ID: Sheila Tucker, female    DOB: 1950/07/19   MRN: [id number]  HPI  51 yowf never smoker with asthma in teenage years while growing up  On [place] with allergy shots for up to 20 years, many admits " never better with shots" but much better p starting  rx with maxair / qvar 2 bid but losing ground x 2013 attributed to no longer can get maxair and husband smoking and much worse x early October 2015 so referred by Dr Delman Cheadle to pulmonary clinic 10/28/13    10/28/2013 1st [facility] office visit/ Netty Sullivant   Chief Complaint  Patient presents with  . Pulmonary Consult    Referred by Dr. Delman Cheadle. Pt states that she was dxed with Asthma approx 50 yrs ago. She c/o increased SOB for the past 2 wks- she relates this to not taking maxair any longer since they stopped making it.    much worse x 2 weeks transiently better on prednisone  Previous exacerbations  typically with colds  No noct symptoms or cough  hfa very poor baseline   No obvious other patterns in day to day or daytime variabilty or assoc chronic cough or cp or chest tightness, subjective wheeze overt sinus or hb symptoms. No unusual exp hx or h/o childhood pna/ asthma or knowledge of premature birth.  Sleeping ok without nocturnal  or early am exacerbation  of respiratory  c/o's or need for noct saba. Also denies any obvious fluctuation of symptoms with weather or environmental changes or other aggravating or alleviating factors except as outlined above   Current Medications, Allergies, Complete Past Medical History, Past Surgical History, Family History, and Social History were reviewed in [employer] record.             Review of Systems  Constitutional: Negative for fever, chills and unexpected weight change.  HENT: Negative for congestion, dental problem, ear pain, nosebleeds, postnasal drip, rhinorrhea, sinus pressure, sneezing, sore throat, trouble swallowing and voice  change.   Eyes: Negative for visual disturbance.  Respiratory: Positive for shortness of breath. Negative for cough and choking.   Cardiovascular: Negative for chest pain and leg swelling.  Gastrointestinal: Negative for vomiting, abdominal pain and diarrhea.  Genitourinary: Negative for difficulty urinating.  Musculoskeletal: Negative for arthralgias.  Skin: Negative for rash.  Neurological: Negative for tremors, syncope and headaches.  Hematological: Does not bruise/bleed easily.       Objective:   Physical Exam   Wt Readings from Last 3 Encounters:  10/28/13 305 lb (138.347 kg)  10/22/13 307 lb 3.2 oz (139.345 kg)  04/16/13 295 lb (133.811 kg)      HEENT: nl dentition, turbinates, and orophanx. Nl external ear canals without cough reflex   NECK :  without JVD/Nodes/TM/ nl carotid upstrokes bilaterally   LUNGS: no acc muscle use, clear to A and P bilaterally without cough on insp or exp maneuvers   CV:  RRR  no s3 or murmur or increase in P2, no edema   ABD:  soft and nontender with nl excursion in the supine position. No bruits or organomegaly, bowel sounds nl  MS:  warm without deformities, calf tenderness, cyanosis or clubbing  SKIN: warm and dry without lesions    NEURO:  alert, approp, no deficits    CXR  10/28/2013 :   No active dz     Assessment & Plan:

## 2013-10-29 ENCOUNTER — Telehealth: Payer: Self-pay | Admitting: Internal Medicine

## 2013-10-29 NOTE — Telephone Encounter (Signed)
Pt is requesting CXR results from 10-28-13. Please advise.  Bing, CMA

## 2013-10-29 NOTE — Assessment & Plan Note (Signed)
-   10/28/2013  Walked RA x 2 laps @ 185 ft each stopped due to  Sob/ dizzy @ fast pace, no desat  C/w obesity and effects of airflow obst - really needs basic labs to screen for thyroid dz/ anemia/ occult dvt/pe chf / cri but declines due to cost and will return if not better p rx for asthma

## 2013-10-29 NOTE — Assessment & Plan Note (Addendum)
Spirometry 10/28/13    FEV1  1.52 (55%) ratio 62  She is morbidly obese and has a restrictive component and certainly risks other causes for sob but has enough airflow obst to work on the asthma component first and then regroup, especially because her baseline hfa was so poor on testing today  The proper method of use, as well as anticipated side effects, of a metered-dose inhaler are discussed and demonstrated to the patient. Improved effectiveness after extensive coaching during this visit to a level of approximately  90% from a baseline of < 25%   Will therefore continue qvar 80 2 bid and let her use the saba up to every 4 hours with goal of < 2x weekly reviewed  Also acutely rx with pred x 6 days

## 2013-10-29 NOTE — Telephone Encounter (Signed)
LMOM TCB x2 - once on each number provided in original message.

## 2013-10-29 NOTE — Telephone Encounter (Signed)
Sorry, must have inadvertently closed the result > cxr in normal

## 2013-10-30 NOTE — Telephone Encounter (Signed)
LMTCB

## 2013-10-30 NOTE — Telephone Encounter (Signed)
[phone] calling back

## 2013-10-31 NOTE — Telephone Encounter (Signed)
Called and spoke to pt. Informed pt of the results per MW. Pt verbalized understanding and denied any further questions or concerns at this time.

## 2013-11-20 ENCOUNTER — Encounter: Payer: Self-pay | Admitting: Internal Medicine

## 2013-11-25 ENCOUNTER — Ambulatory Visit: Payer: BC Managed Care – PPO | Admitting: Internal Medicine

## 2013-12-03 ENCOUNTER — Encounter: Payer: Self-pay | Admitting: Internal Medicine

## 2013-12-03 ENCOUNTER — Other Ambulatory Visit (INDEPENDENT_AMBULATORY_CARE_PROVIDER_SITE_OTHER): Payer: Medicare Other

## 2013-12-03 ENCOUNTER — Ambulatory Visit (INDEPENDENT_AMBULATORY_CARE_PROVIDER_SITE_OTHER): Payer: Medicare Other | Admitting: Internal Medicine

## 2013-12-03 VITALS — BP 118/70 | HR 85 | Ht 69.0 in | Wt 290.6 lb

## 2013-12-03 DIAGNOSIS — R0600 Dyspnea, unspecified: Secondary | ICD-10-CM

## 2013-12-03 DIAGNOSIS — J4540 Moderate persistent asthma, uncomplicated: Secondary | ICD-10-CM

## 2013-12-03 DIAGNOSIS — E785 Hyperlipidemia, unspecified: Secondary | ICD-10-CM

## 2013-12-03 DIAGNOSIS — I10 Essential (primary) hypertension: Secondary | ICD-10-CM

## 2013-12-03 LAB — CBC WITH DIFFERENTIAL/PLATELET
Basophils Absolute: 0 10*3/uL (ref 0.0–0.1)
Basophils Relative: 0.2 % (ref 0.0–3.0)
Eosinophils Absolute: 0.2 10*3/uL (ref 0.0–0.7)
Eosinophils Relative: 3.2 % (ref 0.0–5.0)
HEMATOCRIT: 47.4 % — AB (ref 36.0–46.0)
HEMOGLOBIN: 15.6 g/dL — AB (ref 12.0–15.0)
LYMPHS ABS: 1.8 10*3/uL (ref 0.7–4.0)
Lymphocytes Relative: 24.7 % (ref 12.0–46.0)
MCHC: 32.9 g/dL (ref 30.0–36.0)
MCV: 87.2 fl (ref 78.0–100.0)
Monocytes Absolute: 0.6 10*3/uL (ref 0.1–1.0)
Monocytes Relative: 8.7 % (ref 3.0–12.0)
NEUTROS ABS: 4.6 10*3/uL (ref 1.4–7.7)
Neutrophils Relative %: 63.2 % (ref 43.0–77.0)
PLATELETS: 253 10*3/uL (ref 150.0–400.0)
RBC: 5.44 Mil/uL — ABNORMAL HIGH (ref 3.87–5.11)
RDW: 14.6 % (ref 11.5–15.5)
WBC: 7.3 10*3/uL (ref 4.0–10.5)

## 2013-12-03 LAB — BASIC METABOLIC PANEL
BUN: 8 mg/dL (ref 6–23)
CHLORIDE: 102 meq/L (ref 96–112)
CO2: 30 mEq/L (ref 19–32)
Calcium: 9.2 mg/dL (ref 8.4–10.5)
Creatinine, Ser: 0.8 mg/dL (ref 0.4–1.2)
GFR: 77.62 mL/min (ref >60.00)
GLUCOSE: 98 mg/dL (ref 70–99)
Potassium: 4.6 mEq/L (ref 3.5–5.1)
SODIUM: 141 meq/L (ref 135–145)

## 2013-12-03 LAB — BRAIN NATRIURETIC PEPTIDE: Pro B Natriuretic peptide (BNP): 14 pg/mL (ref 0.0–100.0)

## 2013-12-03 LAB — TSH: TSH: 1.08 u[IU]/mL (ref 0.35–4.50)

## 2013-12-03 MED ORDER — BUDESONIDE-FORMOTEROL FUMARATE 160-4.5 MCG/ACT IN AERO: INHALATION_SPRAY | RESPIRATORY_TRACT | Status: DC

## 2013-12-03 NOTE — Patient Instructions (Signed)
Symbicort 160 Take 2 puffs first thing in am and then another 2 puffs about 12 hours later.   Only use your albuterol as a rescue medication to be used if you can't catch your breath by resting or doing a relaxed purse lip breathing pattern.  - The less you use it, the better it will work when you need it. - Ok to use up to 2 puffs  every 4 hours if you must but call for immediate appointment if use goes up over your usual need - Don't leave home without it !!  (think of it like the spare tire for your car)   Please remember to go to the lab  department downstairs for your tests - we will call you with the results when they are available.    Please schedule a follow up office visit in 6 weeks, call sooner if needed for pfts

## 2013-12-03 NOTE — Progress Notes (Signed)
Quick Note:  Spoke with pt and notified of results per Dr. Wert. Pt verbalized understanding and denied any questions.  ______ 

## 2013-12-03 NOTE — Progress Notes (Signed)
Subjective:    Patient ID: Sheila Tucker, female    DOB: 1950/01/28   MRN: [id number]  Brief patient profile:  51 yowf never smoker with asthma in teenage years while growing up  On [place] with allergy shots for up to 20 years, many admits " never better with shots" but much better p starting  rx with maxair / qvar 2 bid but losing ground x 2013 attributed to no longer can get maxair and husband smoking and much worse x early October 2015 so referred by Dr Delman Cheadle to pulmonary clinic 10/28/13    10/28/2013 1st [facility] office visit/ Sheila Tucker   Chief Complaint  Patient presents with  . Pulmonary Consult    Referred by Dr. Delman Cheadle. Pt states that she was dxed with Asthma approx 50 yrs ago. She c/o increased SOB for the past 2 wks- she relates this to not taking maxair any longer since they stopped making it.    much worse x 2 weeks transiently better on prednisone  Previous exacerbations  typically with colds  No noct symptoms or cough  hfa very poor baseline  rec Continue qvar Take 2 puffs first thing in am and then another 2 puffs about 12 hours later.  Prednisone 10 mg take  4 each am x 2 days,   2 each am x 2 days,  1 each am x 2 days and stop  only use your albuterol as a rescue medication  Work on perfecting  inhaler technique:       12/03/2013 f/u ov/[name] re: asthma on qvar 8 2bid and prn saba  Chief Complaint  Patient presents with  . Follow-up    Pt states that her breathing is some better, but not back to her normal baseline. Has not had to use her rescue inhaler since the last visit.   walking across a parking lot better but not back to baseline for her  No obvious day to day or daytime variabilty or assoc chronic cough or cp or chest tightness, subjective wheeze overt sinus or hb symptoms. No unusual exp hx or h/o childhood pna/ asthma or knowledge of premature birth.  Sleeping ok without nocturnal  or early am exacerbation  of respiratory  c/o's or need for  noct saba. Also denies any obvious fluctuation of symptoms with weather or environmental changes or other aggravating or alleviating factors except as outlined above   Current Medications, Allergies, Complete Past Medical History, Past Surgical History, Family History, and Social History were reviewed in [employer] record.  ROS  The following are not active complaints unless bolded sore throat, dysphagia, dental problems, itching, sneezing,  nasal congestion or excess/ purulent secretions, ear ache,   fever, chills, sweats, unintended wt loss, pleuritic or exertional cp, hemoptysis,  orthopnea pnd or leg swelling, presyncope, palpitations, heartburn, abdominal pain, anorexia, nausea, vomiting, diarrhea  or change in bowel or urinary habits, change in stools or urine, dysuria,hematuria,  rash, arthralgias, visual complaints, headache, numbness weakness or ataxia or problems with walking or coordination,  change in mood/affect or memory.                         Objective:   Physical Exam  12/03/2013        291  Wt Readings from Last 3 Encounters:  10/28/13 305 lb (138.347 kg)  10/22/13 307 lb 3.2 oz (139.345 kg)  04/16/13 295 lb (133.811 kg)  HEENT: nl dentition, turbinates, and orophanx. Nl external ear canals without cough reflex   NECK :  without JVD/Nodes/TM/ nl carotid upstrokes bilaterally   LUNGS: no acc muscle use, clear to A and P bilaterally without cough on insp or exp maneuvers   CV:  RRR  no s3 or murmur or increase in P2, no edema   ABD:  soft and nontender with nl excursion in the supine position. No bruits or organomegaly, bowel sounds nl  MS:  warm without deformities, calf tenderness, cyanosis or clubbing  SKIN: warm and dry without lesions       CXR  10/28/2013 :  No active dz    Recent Labs Lab 12/03/13 1004  NA 141  K 4.6  CL 102  CO2 30  BUN 8  CREATININE 0.8  GLUCOSE 98    Recent Labs Lab 12/03/13 1004    HGB 15.6*  HCT 47.4*  WBC 7.3  PLT 253.0     Lab Results  Component Value Date   TSH 1.08 12/03/2013     Lab Results  Component Value Date   PROBNP 14.0 12/03/2013    Lab Results  Component Value Date   DDIMER 0.81* 12/03/2013       Assessment & Plan:

## 2013-12-04 LAB — D-DIMER, QUANTITATIVE: D-Dimer, Quant: 0.81 ug/mL-FEU — ABNORMAL HIGH (ref 0.00–0.48)

## 2013-12-05 NOTE — Assessment & Plan Note (Signed)
Spirometry 10/28/13    FEV1  1.52 (55%) ratio 62  DDX of  difficult airways management all start with A and  include Adherence, Ace Inhibitors, Acid Reflux, Active Sinus Disease, Alpha 1 Antitripsin deficiency, Anxiety masquerading as Airways dz,  ABPA,  allergy(esp in young), Aspiration (esp in elderly), Adverse effects of DPI,  Active smokers, plus two Bs  = Bronchiectasis and Beta blocker use..and one C= CHF  Adherence is always the initial "prime suspect" and is a multilayered concern that requires a "trust but verify" approach in every patient - starting with knowing how to use medications, especially inhalers, correctly, keeping up with refills and understanding the fundamental difference between maintenance and prns vs those medications only taken for a very short course and then stopped and not refilled. The proper method of use, as well as anticipated side effects, of a metered-dose inhaler are discussed and demonstrated to the patient. Improved effectiveness after extensive coaching during this visit to a level of approximately  75% so try symbicort 160 2bid then repeat pfts in 6 weeks  ? Allergy/ defer w/u for now until see response  ? A PE  > d dimer borderline elevated for age  But  D dimer nl - while a mildly elevated level  may miss small peripheral pe, the clot burden with sob is moderately high and the d dimer has a very high neg pred value in this setting so do not feel further w/u indicated, esp since we have an alternate explanation for doe.    Each maintenance medication was reviewed in detail including most importantly the difference between maintenance and as needed and under what circumstances the prns are to be used.  Please see instructions for details which were reviewed in writing and the patient given a copy.

## 2013-12-05 NOTE — Progress Notes (Signed)
Quick Note:  Spoke with pt and notified of results per Dr. Wert. Pt verbalized understanding and denied any questions.  ______ 

## 2013-12-20 ENCOUNTER — Telehealth: Payer: Self-pay | Admitting: Internal Medicine

## 2013-12-20 MED ORDER — BUDESONIDE-FORMOTEROL FUMARATE 160-4.5 MCG/ACT IN AERO: INHALATION_SPRAY | RESPIRATORY_TRACT | Status: DC

## 2013-12-20 NOTE — Telephone Encounter (Signed)
Called spoke with pt. She was given sample of symbicort at last OV 12/03/13. She will run out of the sample and her insurance does not start until 01/03/14. She likes the symbicort and how it works. Pt has been left 1 sample for pick up. Nothing further needed

## 2014-01-09 ENCOUNTER — Telehealth: Payer: Self-pay | Admitting: *Deleted

## 2014-01-09 ENCOUNTER — Ambulatory Visit (AMBULATORY_SURGERY_CENTER): Payer: Self-pay | Admitting: *Deleted

## 2014-01-09 VITALS — Ht 68.0 in | Wt 283.0 lb

## 2014-01-09 DIAGNOSIS — Z86010 Personal history of colonic polyps: Secondary | ICD-10-CM

## 2014-01-09 NOTE — Telephone Encounter (Signed)
Called pt back and advised Dr Hilarie Fredrickson want to run a pathogen panel 1st, she can pick up stool kit in lab, pt states she will come tomorrow and get it.-adm

## 2014-01-09 NOTE — Telephone Encounter (Signed)
Please order GI pathogen panel 1st and if negative APP visit before colonoscopy Thanks

## 2014-01-09 NOTE — Telephone Encounter (Signed)
Pt was seen in pre-visit 01/09/14 for colonoscopy on 01/23/14 at [facility] for recall. Pt has hx of polyps and family hx of colon ca. Pt reports having watery diarrhea since November, pt denies blood or mucous, pt reports around 10 episodes of diarrhea a day and 3-4 times at night. Pt can not take imodium because it makes her nausea but has taken pepto-bismol, which only helps with the cramping. Do you want pt to see extender prior to procedure or just address issue on procedure date?  Instructions for prep and consent signed 01/09/14.-adm

## 2014-01-09 NOTE — Telephone Encounter (Signed)
[name],  Can you follow-up on this? Sheila Tucker entered the pathogen panel for me and pt understands she will need to come pick it up from lab, pt is schedule for colonoscopy on 01/23/14. She was seen in pre-visit 01/09/14 and reported having 10 episodes of diarrhea daily. Sent message to Dr Hilarie Fredrickson and he advised pathogen panel and if neg an APP visit prior to colonoscopy.  Thanks-adm

## 2014-01-09 NOTE — Progress Notes (Signed)
No egg or soy allergy. No anesthesia problems.  No diet meds.  No home O2.

## 2014-01-10 ENCOUNTER — Other Ambulatory Visit: Payer: Self-pay | Admitting: Internal Medicine

## 2014-01-10 ENCOUNTER — Other Ambulatory Visit: Payer: Medicare Other

## 2014-01-10 DIAGNOSIS — R197 Diarrhea, unspecified: Secondary | ICD-10-CM

## 2014-01-13 ENCOUNTER — Other Ambulatory Visit: Payer: Medicare Other

## 2014-01-13 ENCOUNTER — Other Ambulatory Visit: Payer: Self-pay | Admitting: Internal Medicine

## 2014-01-13 DIAGNOSIS — R197 Diarrhea, unspecified: Secondary | ICD-10-CM

## 2014-01-13 DIAGNOSIS — J45909 Unspecified asthma, uncomplicated: Secondary | ICD-10-CM

## 2014-01-14 ENCOUNTER — Ambulatory Visit: Payer: 59 | Admitting: Internal Medicine

## 2014-01-17 ENCOUNTER — Telehealth: Payer: Self-pay | Admitting: Internal Medicine

## 2014-01-17 DIAGNOSIS — Z800 Family history of malignant neoplasm of digestive organs: Secondary | ICD-10-CM

## 2014-01-17 MED ORDER — MOVIPREP 100 G PO SOLR: 1.0000 | Freq: Once | ORAL | Status: DC

## 2014-01-17 NOTE — Telephone Encounter (Signed)
movi prep sent to pt's pharmacy as per protocol. Called pt at number left and LM this was sent to her pharmacy and to [facility] if questions or problems  Lelan Pons PV

## 2014-01-20 ENCOUNTER — Telehealth: Payer: Self-pay | Admitting: Internal Medicine

## 2014-01-20 DIAGNOSIS — Z800 Family history of malignant neoplasm of digestive organs: Secondary | ICD-10-CM

## 2014-01-20 LAB — GASTROINTESTINAL PATHOGEN PANEL PCR
C. difficile Tox A/B, PCR: NEGATIVE
Campylobacter, PCR: NEGATIVE
Cryptosporidium, PCR: NEGATIVE
E COLI 0157, PCR: NEGATIVE
E coli (ETEC) LT/ST PCR: NEGATIVE
E coli (STEC) stx1/stx2, PCR: NEGATIVE
GIARDIA LAMBLIA, PCR: NEGATIVE
Norovirus, PCR: NEGATIVE
Rotavirus A, PCR: NEGATIVE
SHIGELLA, PCR: NEGATIVE
Salmonella, PCR: NEGATIVE

## 2014-01-20 MED ORDER — MOVIPREP 100 G PO SOLR
1.0000 | Freq: Once | ORAL | Status: DC
Start: 2014-01-20 — End: 2014-01-23

## 2014-01-20 NOTE — Telephone Encounter (Signed)
Spoke with pt and let her know the results are not back yet. Moviprep sent to the pharmacy.

## 2014-01-21 ENCOUNTER — Encounter: Payer: Medicare Other | Admitting: Gastroenterology

## 2014-01-21 ENCOUNTER — Encounter: Payer: Self-pay | Admitting: Gastroenterology

## 2014-01-21 NOTE — Telephone Encounter (Signed)
Pt scheduled to see Alonza Bogus PA today at 1:30pm.

## 2014-01-21 NOTE — Progress Notes (Signed)
This encounter was created in error - please disregard.

## 2014-01-23 ENCOUNTER — Encounter: Payer: Self-pay | Admitting: Internal Medicine

## 2014-01-23 ENCOUNTER — Ambulatory Visit (AMBULATORY_SURGERY_CENTER): Payer: Medicare Other | Admitting: Internal Medicine

## 2014-01-23 VITALS — BP 152/87 | HR 85 | Temp 97.6°F | Resp 42 | Ht 68.0 in | Wt 283.0 lb

## 2014-01-23 DIAGNOSIS — R197 Diarrhea, unspecified: Secondary | ICD-10-CM

## 2014-01-23 DIAGNOSIS — D72820 Lymphocytosis (symptomatic): Secondary | ICD-10-CM

## 2014-01-23 DIAGNOSIS — Z86010 Personal history of colonic polyps: Secondary | ICD-10-CM | POA: Diagnosis not present

## 2014-01-23 DIAGNOSIS — D125 Benign neoplasm of sigmoid colon: Secondary | ICD-10-CM

## 2014-01-23 DIAGNOSIS — D124 Benign neoplasm of descending colon: Secondary | ICD-10-CM | POA: Diagnosis not present

## 2014-01-23 DIAGNOSIS — K529 Noninfective gastroenteritis and colitis, unspecified: Secondary | ICD-10-CM | POA: Diagnosis not present

## 2014-01-23 DIAGNOSIS — J45909 Unspecified asthma, uncomplicated: Secondary | ICD-10-CM | POA: Diagnosis not present

## 2014-01-23 DIAGNOSIS — I10 Essential (primary) hypertension: Secondary | ICD-10-CM | POA: Diagnosis not present

## 2014-01-23 MED ORDER — DIPHENOXYLATE-ATROPINE 2.5-0.025 MG PO TABS: 1.0000 | ORAL_TABLET | Freq: Four times a day (QID) | ORAL | Status: DC | PRN

## 2014-01-23 MED ORDER — SODIUM CHLORIDE 0.9 % IV SOLN: 500.0000 mL | INTRAVENOUS | Status: DC

## 2014-01-23 NOTE — Progress Notes (Signed)
Called to room to assist during endoscopic procedure.  Patient ID and intended procedure confirmed with present staff. Received instructions for my participation in the procedure from the performing physician.  

## 2014-01-23 NOTE — Patient Instructions (Signed)
YOU HAD AN ENDOSCOPIC PROCEDURE TODAY AT [facility]: Refer to the procedure report that was given to you for any specific questions about what was found during the examination.  If the procedure report does not answer your questions, please call your gastroenterologist to clarify.  If you requested that your care partner not be given the details of your procedure findings, then the procedure report has been included in a sealed envelope for you to review at your convenience later.  YOU SHOULD EXPECT: Some feelings of bloating in the abdomen. Passage of more gas than usual.  Walking can help get rid of the air that was put into your GI tract during the procedure and reduce the bloating. If you had a lower endoscopy (such as a colonoscopy or flexible sigmoidoscopy) you may notice spotting of blood in your stool or on the toilet paper. If you underwent a bowel prep for your procedure, then you may not have a normal bowel movement for a few days.  DIET: Your first meal following the procedure should be a light meal and then it is ok to progress to your normal diet.  A half-sandwich or bowl of soup is an example of a good first meal.  Heavy or fried foods are harder to digest and may make you feel nauseous or bloated.  Likewise meals heavy in dairy and vegetables can cause extra gas to form and this can also increase the bloating.  Drink plenty of fluids but you should avoid alcoholic beverages for 24 hours. Try to increase the fiber in your diet.  ACTIVITY: Your care partner should take you home directly after the procedure.  You should plan to take it easy, moving slowly for the rest of the day.  You can resume normal activity the day after the procedure however you should NOT DRIVE or use heavy machinery for 24 hours (because of the sedation medicines used during the test).    SYMPTOMS TO REPORT IMMEDIATELY: A gastroenterologist can be reached at any hour.  During normal business hours, 8:30  AM to 5:00 PM Monday through Friday, call [phone].  After hours and on weekends, please call the GI answering service at [phone] who will take a message and have the physician on call contact you.   Following lower endoscopy (colonoscopy or flexible sigmoidoscopy):  Excessive amounts of blood in the stool  Significant tenderness or worsening of abdominal pains  Swelling of the abdomen that is new, acute  Fever of 100F or higher  FOLLOW UP: If any biopsies were taken you will be contacted by phone or by letter within the next 1-3 weeks.  Call your gastroenterologist if you have not heard about the biopsies in 3 weeks.  Our staff will call the home number listed on your records the next business day following your procedure to check on you and address any questions or concerns that you may have at that time regarding the information given to you following your procedure. This is a courtesy call and so if there is no answer at the home number and we have not heard from you through the emergency physician on call, we will assume that you have returned to your regular daily activities without incident.  SIGNATURES/CONFIDENTIALITY: You and/or your care partner have signed paperwork which will be entered into your electronic medical record.  These signatures attest to the fact that that the information above on your After Visit Summary has been reviewed and is understood.  Full responsibility of the confidentiality of this discharge information lies with you and/or your care-partner.  No NSAIDS nor Aspirin for two weeks due to the polyp removal.

## 2014-01-23 NOTE — Progress Notes (Signed)
Lomotil script given to patient per Dr. Hilarie Fredrickson.

## 2014-01-23 NOTE — Op Note (Signed)
[place]  [address]. [place], [address]   COLONOSCOPY PROCEDURE REPORT  PATIENT: Sheila, Tucker  MR#: [id number] BIRTHDATE: [birth year] , 63  yrs. old GENDER: female ENDOSCOPIST: Jerene Bears, MD REFERRED Katherina Mires, M.D. PROCEDURE DATE:  01/23/2014 PROCEDURE:   Colonoscopy with cold biopsy polypectomy, Colonoscopy with biopsy, and Colonoscopy with snare polypectomy First Screening Colonoscopy - Avg.  risk and is 50 yrs.  old or older - No.  Prior Negative Screening - Now for repeat screening. N/A  History of Adenoma - Now for follow-up colonoscopy & has been > or = to 3 yrs.  Yes hx of adenoma.  Has been 3 or more years since last colonoscopy.  Polyps Removed Today? Yes. ASA CLASS:   Class III INDICATIONS:surveillance colonoscopy based on a history of adenomatous colonic polyp(s), unexplained diarrhea, and last colonoscopy completed  years ago. MEDICATIONS: Monitored anesthesia care and Propofol 400 mg IV  DESCRIPTION OF PROCEDURE:   After the risks benefits and alternatives of the procedure were thoroughly explained, informed consent was obtained.  The digital rectal exam revealed no rectal mass and revealed several skin tags.   The LB [id number]-H190 [id number] endoscope was introduced through the anus and advanced to the cecum, which was identified by both the appendix and ileocecal valve. No adverse events experienced.   The quality of the prep was good, using MoviPrep  The instrument was then slowly withdrawn as the colon was fully examined.   COLON FINDINGS: A sessile polyp measuring 4 mm in size was found in the sigmoid colon.  A polypectomy was performed with cold forceps. The resection was complete, the polyp tissue was completely retrieved and sent to histology.   A sessile polyp measuring 7 mm in size was found in the descending colon.  A polypectomy was performed with a cold snare.   There was moderate diverticulosis noted in the sigmoid colon,  descending colon, and ascending colon. The colonic mucosa appeared normal other than the previously described polyps, random biopsies were performed using cold forceps.  Samples were sent to R/O microscopic colitis. Retroflexed views revealed no abnormalities. The time to cecum=5 minutes 34 seconds.  Withdrawal time=20 minutes 34 seconds.  The scope was withdrawn and the procedure completed.  COMPLICATIONS: There were no immediate complications.  ENDOSCOPIC IMPRESSION: 1.   Sessile polyp was found in the sigmoid colon; polypectomy was performed with cold forceps 2.   Sessile polyp was found in the descending colon; polypectomy was performed with a cold snare 3.   Moderate diverticulosis was noted in the sigmoid colon, descending colon, and ascending colon 4.   The colonic mucosa appeared otherwise normal; multiple random biopsies were performed using cold forceps     RECOMMENDATIONS: 1.  Await biopsy results 2.  Avoid all NSAIDS for the next 2 weeks. 3.  Timing of repeat colonoscopy will be determined by pathology findings. 4.  You will receive a letter within 1-2 weeks with the results of your biopsy as well as final recommendations.  Please call my office if you have not received a letter after 3 weeks.  eSigned:  Jerene Bears, MD 01/23/2014 12:23 PM   cc: The Patient and Delman Cheadle MD   PATIENT NAME:  Sheila, Tucker MR#: [id number]

## 2014-01-23 NOTE — Progress Notes (Signed)
Report to PACU, RN, vss, BBS= Clear.  

## 2014-01-27 ENCOUNTER — Telehealth: Payer: Self-pay | Admitting: *Deleted

## 2014-01-27 NOTE — Telephone Encounter (Signed)
  Follow up Call-  Call back number 01/23/2014  Post procedure Call Back phone  # [phone]  Permission to leave phone message Yes     Patient questions:  Do you have a fever, pain , or abdominal swelling? No. Pain Score  0 *  Have you tolerated food without any problems? Yes.    Have you been able to return to your normal activities? yes  Do you have any questions about your discharge instructions: Diet   No. Medications  No. Follow up visit  No.  Do you have questions or concerns about your Care? No.  Actions: * If pain score is 4 or above: No action needed, pain <4.

## 2014-01-31 ENCOUNTER — Telehealth: Payer: Self-pay | Admitting: Internal Medicine

## 2014-01-31 ENCOUNTER — Encounter: Payer: Self-pay | Admitting: Internal Medicine

## 2014-01-31 MED ORDER — BUDESONIDE 3 MG PO CP24: 9.0000 mg | ORAL_CAPSULE | Freq: Every day | ORAL | Status: DC

## 2014-01-31 NOTE — Telephone Encounter (Signed)
Pathology results reviewed, random biopsies consistent with lymphocytic colitis which explains her diarrhea I called her and discussed results by phone. She is thankful for the diagnosis Again budesonide 9 mg daily 6-8 weeks and will plan to wean drug from there assuming good response Office follow-up in 6 weeks Colonic polyps recently removed were adenomas, repeat colonoscopy in 5 years

## 2014-02-03 ENCOUNTER — Encounter: Payer: Self-pay | Admitting: Internal Medicine

## 2014-02-03 ENCOUNTER — Ambulatory Visit (INDEPENDENT_AMBULATORY_CARE_PROVIDER_SITE_OTHER): Payer: Medicare Other | Admitting: Internal Medicine

## 2014-02-03 VITALS — BP 118/82 | HR 73 | Temp 98.4°F | Ht 68.0 in | Wt 290.8 lb

## 2014-02-03 DIAGNOSIS — J4550 Severe persistent asthma, uncomplicated: Secondary | ICD-10-CM | POA: Diagnosis not present

## 2014-02-03 DIAGNOSIS — J45909 Unspecified asthma, uncomplicated: Secondary | ICD-10-CM

## 2014-02-03 LAB — PULMONARY FUNCTION TEST
DL/VA % pred: 116 %
DL/VA: 6.1 ml/min/mmHg/L
DLCO UNC % PRED: 97 %
DLCO UNC: 28.81 ml/min/mmHg
FEF 25-75 PRE: 0.86 L/s
FEF 25-75 Post: 0.9 L/sec
FEF2575-%Change-Post: 4 %
FEF2575-%PRED-PRE: 36 %
FEF2575-%Pred-Post: 38 %
FEV1-%Change-Post: 1 %
FEV1-%PRED-PRE: 58 %
FEV1-%Pred-Post: 59 %
FEV1-Post: 1.66 L
FEV1-Pre: 1.63 L
FEV1FVC-%Change-Post: 1 %
FEV1FVC-%PRED-PRE: 83 %
FEV6-%Change-Post: -1 %
FEV6-%PRED-POST: 69 %
FEV6-%Pred-Pre: 70 %
FEV6-POST: 2.43 L
FEV6-PRE: 2.48 L
FEV6FVC-%Change-Post: 1 %
FEV6FVC-%Pred-Post: 104 %
FEV6FVC-%Pred-Pre: 102 %
FVC-%CHANGE-POST: 0 %
FVC-%PRED-POST: 68 %
FVC-%Pred-Pre: 69 %
FVC-PRE: 2.53 L
FVC-Post: 2.52 L
POST FEV1/FVC RATIO: 66 %
Post FEV6/FVC ratio: 100 %
Pre FEV1/FVC ratio: 65 %
Pre FEV6/FVC Ratio: 98 %

## 2014-02-03 MED ORDER — BUDESONIDE-FORMOTEROL FUMARATE 160-4.5 MCG/ACT IN AERO: INHALATION_SPRAY | RESPIRATORY_TRACT | Status: DC

## 2014-02-03 NOTE — Patient Instructions (Signed)
Continue symbicort 160 Take 2 puffs first thing in am and then another 2 puffs about 12 hours later (ok to leave off pm dose if doing great)    Only use your albuterol as a rescue medication to be used if you can't catch your breath by resting or doing a relaxed purse lip breathing pattern.  - The less you use it, the better it will work when you need it. - Ok to use up to 2 puffs  every 4 hours if you must but call for immediate appointment if use goes up over your usual need - Don't leave home without it !!  (think of it like the spare tire for your car)    If you are satisfied with your treatment plan,  let your doctor know and he/she can either refill your medications or you can return here when your prescription runs out.     If in any way you are not 100% satisfied,  please tell us.  If 100% better, tell your friends!  Pulmonary follow up is as needed

## 2014-02-03 NOTE — Progress Notes (Signed)
Subjective:    Patient ID: Sheila Tucker, female    DOB: Jan 30, 1950   MRN: [id number]  Brief patient profile:  45 yowf never smoker with asthma in teenage years while growing up  On [place] with allergy shots for up to 20 years, many admits " never better with shots" but much better p starting  rx with maxair / qvar 2 bid but losing ground x 2013 attributed to no longer can get maxair and husband smoking and much worse x early October 2015 so referred by Dr Delman Cheadle to pulmonary clinic 10/28/13   History of Present Illness  10/28/2013 1st [facility] office visit/ Sheila Tucker   Chief Complaint  Patient presents with  . Pulmonary Consult    Referred by Dr. Delman Cheadle. Pt states that she was dxed with Asthma approx 50 yrs ago. She c/o increased SOB for the past 2 wks- she relates this to not taking maxair any longer since they stopped making it.    much worse x 2 weeks transiently better on prednisone  Previous exacerbations  typically with colds  No noct symptoms or cough  hfa very poor baseline  rec Continue qvar Take 2 puffs first thing in am and then another 2 puffs about 12 hours later.  Prednisone 10 mg take  4 each am x 2 days,   2 each am x 2 days,  1 each am x 2 days and stop  only use your albuterol as a rescue medication  Work on perfecting  inhaler technique:       12/03/2013 f/u ov/Sheila Tucker re: asthma on qvar 8 2bid and prn saba  Chief Complaint  Patient presents with  . Follow-up    Pt states that her breathing is some better, but not back to her normal baseline. Has not had to use her rescue inhaler since the last visit.   walking across a parking lot better but not back to baseline for her rec Symbicort 160 Take 2 puffs first thing in am and then another 2 puffs about 12 hours later.  Only use your albuterol as rescue     02/03/2014 f/u ov/Sheila Tucker re: chronic severe asthma marked improvement on maint rx with symbicort 160  Chief Complaint  Patient presents with  .  Follow-up    Review PFT. Reports good tolerance on Symbicort, much improvement in breathing since last seen.    Not limited by breathing from desired activities   Never using proventil now  No obvious day to day or daytime variabilty or assoc chronic cough or cp or chest tightness, subjective wheeze overt sinus or hb symptoms. No unusual exp hx or h/o childhood pna/ asthma or knowledge of premature birth.  Sleeping ok without nocturnal  or early am exacerbation  of respiratory  c/o's or need for noct saba. Also denies any obvious fluctuation of symptoms with weather or environmental changes or other aggravating or alleviating factors except as outlined above   Current Medications, Allergies, Complete Past Medical History, Past Surgical History, Family History, and Social History were reviewed in [employer] record.  ROS  The following are not active complaints unless bolded sore throat, dysphagia, dental problems, itching, sneezing,  nasal congestion or excess/ purulent secretions, ear ache,   fever, chills, sweats, unintended wt loss, pleuritic or exertional cp, hemoptysis,  orthopnea pnd or leg swelling, presyncope, palpitations, heartburn, abdominal pain, anorexia, nausea, vomiting, diarrhea  or change in bowel or urinary habits, change in stools or  urine, dysuria,hematuria,  rash, arthralgias, visual complaints, headache, numbness weakness or ataxia or problems with walking or coordination,  change in mood/affect or memory.                         Objective:   Physical Exam  12/03/2013        291  Vs  02/03/14  291  Wt Readings from Last 3 Encounters:  10/28/13 305 lb (138.347 kg)  10/22/13 307 lb 3.2 oz (139.345 kg)  04/16/13 295 lb (133.811 kg)      HEENT: nl dentition, turbinates, and orophanx. Nl external ear canals without cough reflex   NECK :  without JVD/Nodes/TM/ nl carotid upstrokes bilaterally   LUNGS: no acc muscle use, clear to A and P  bilaterally without cough on insp or exp maneuvers   CV:  RRR  no s3 or murmur or increase in P2, no edema   ABD:  soft and nontender with nl excursion in the supine position. No bruits or organomegaly, bowel sounds nl  MS:  warm without deformities, calf tenderness, cyanosis or clubbing  SKIN: warm and dry without lesions       CXR  10/28/2013 :  No active dz    Recent Labs Lab 12/03/13 1004  NA 141  K 4.6  CL 102  CO2 30  BUN 8  CREATININE 0.8  GLUCOSE 98    Recent Labs Lab 12/03/13 1004  HGB 15.6*  HCT 47.4*  WBC 7.3  PLT 253.0     Lab Results  Component Value Date   TSH 1.08 12/03/2013     Lab Results  Component Value Date   PROBNP 14.0 12/03/2013    Lab Results  Component Value Date   DDIMER 0.81* 12/03/2013       Assessment & Plan:

## 2014-02-03 NOTE — Telephone Encounter (Signed)
Recall entered. Pt already has OV scheduled and script was sent to pharmacy.

## 2014-02-03 NOTE — Progress Notes (Signed)
PFT done today. 

## 2014-02-04 ENCOUNTER — Encounter: Payer: Self-pay | Admitting: Internal Medicine

## 2014-02-04 NOTE — Assessment & Plan Note (Addendum)
Spirometry 10/28/13    FEV1  1.52 (55%) ratio 62 - 12/03/2013  try symbicort 160 2bid  - 02/03/2014  PFT's p am symbicort 160 FEV1  1.66 (59%) no better p saba/ dlco 97% p am symbicort   - 02/03/2014 p extensive coaching HFA effectiveness =    90% from a baseline of 75%  I had an extended discussion with the patient reviewing all relevant studies completed to date and  lasting 15 to 20 minutes of a 25 minute visit on the following ongoing concerns:  1) this is severe chronic asthma but All goals of chronic asthma control met including optimal ( but no where near nl)  function and elimination of symptoms with minimal need for rescue therapy. Contingencies discussed in full including contacting this office immediately if not controlling the symptoms using the rule of two's. 2) critical that she stay on some form of laba/ics indefinitely/ insurance/ formulary issues reviewed     3) further efforts to normalize her function no needed and probably wouldn't be successful due to remodeling, but she's happy with what she has now in terms of activity tol which is impressive as she's carrying extra wt > wt loss would help her more than added resp rx  Pulmonary f/u is therefore prn

## 2014-02-24 DIAGNOSIS — M9903 Segmental and somatic dysfunction of lumbar region: Secondary | ICD-10-CM | POA: Diagnosis not present

## 2014-02-24 DIAGNOSIS — M545 Low back pain: Secondary | ICD-10-CM | POA: Diagnosis not present

## 2014-02-25 DIAGNOSIS — M545 Low back pain: Secondary | ICD-10-CM | POA: Diagnosis not present

## 2014-02-25 DIAGNOSIS — M9903 Segmental and somatic dysfunction of lumbar region: Secondary | ICD-10-CM | POA: Diagnosis not present

## 2014-02-26 DIAGNOSIS — M545 Low back pain: Secondary | ICD-10-CM | POA: Diagnosis not present

## 2014-02-26 DIAGNOSIS — M9903 Segmental and somatic dysfunction of lumbar region: Secondary | ICD-10-CM | POA: Diagnosis not present

## 2014-02-27 DIAGNOSIS — M545 Low back pain: Secondary | ICD-10-CM | POA: Diagnosis not present

## 2014-02-27 DIAGNOSIS — M9903 Segmental and somatic dysfunction of lumbar region: Secondary | ICD-10-CM | POA: Diagnosis not present

## 2014-02-28 DIAGNOSIS — M545 Low back pain: Secondary | ICD-10-CM | POA: Diagnosis not present

## 2014-02-28 DIAGNOSIS — M9903 Segmental and somatic dysfunction of lumbar region: Secondary | ICD-10-CM | POA: Diagnosis not present

## 2014-03-03 DIAGNOSIS — M545 Low back pain: Secondary | ICD-10-CM | POA: Diagnosis not present

## 2014-03-03 DIAGNOSIS — M9903 Segmental and somatic dysfunction of lumbar region: Secondary | ICD-10-CM | POA: Diagnosis not present

## 2014-03-06 DIAGNOSIS — M545 Low back pain: Secondary | ICD-10-CM | POA: Diagnosis not present

## 2014-03-06 DIAGNOSIS — M9903 Segmental and somatic dysfunction of lumbar region: Secondary | ICD-10-CM | POA: Diagnosis not present

## 2014-03-07 ENCOUNTER — Ambulatory Visit (INDEPENDENT_AMBULATORY_CARE_PROVIDER_SITE_OTHER): Payer: Medicare Other | Admitting: Family Medicine

## 2014-03-07 ENCOUNTER — Encounter: Payer: Self-pay | Admitting: Family Medicine

## 2014-03-07 VITALS — BP 170/100 | HR 88 | Temp 98.0°F | Resp 18

## 2014-03-07 DIAGNOSIS — M5442 Lumbago with sciatica, left side: Secondary | ICD-10-CM

## 2014-03-07 DIAGNOSIS — M7062 Trochanteric bursitis, left hip: Secondary | ICD-10-CM | POA: Diagnosis not present

## 2014-03-07 DIAGNOSIS — M545 Low back pain: Secondary | ICD-10-CM | POA: Diagnosis not present

## 2014-03-07 DIAGNOSIS — M9903 Segmental and somatic dysfunction of lumbar region: Secondary | ICD-10-CM | POA: Diagnosis not present

## 2014-03-07 MED ORDER — MELOXICAM 15 MG PO TABS: 15.0000 mg | ORAL_TABLET | Freq: Every day | ORAL | Status: DC

## 2014-03-07 MED ORDER — LIDOCAINE 5 % EX PTCH: 1.0000 | MEDICATED_PATCH | CUTANEOUS | Status: DC

## 2014-03-07 NOTE — Patient Instructions (Signed)
Do not use the meloxicam with any other otc pain medication other than tylenol/acetaminophen - so no aleve, ibuprofen, motrin, advil, etc.  Hip Bursitis Bursitis is a swelling and soreness (inflammation) of a fluid-filled sac (bursa). This sac overlies and protects the joints.  CAUSES   Injury.  Overuse of the muscles surrounding the joint.  Arthritis.  Gout.  Infection.  Cold weather.  Inadequate warm-up and conditioning prior to activities. The cause may not be known.  SYMPTOMS   Mild to severe irritation.  Tenderness and swelling over the outside of the hip.  Pain with motion of the hip.  If the bursa becomes infected, a fever may be present. Redness, tenderness, and warmth will develop over the hip. Symptoms usually lessen in 3 to 4 weeks with treatment, but can come back. TREATMENT If conservative treatment does not work, your caregiver may advise draining the bursa and injecting cortisone into the area. This may speed up the healing process. This may also be used as an initial treatment of choice. HOME CARE INSTRUCTIONS   Apply ice to the affected area for 15-20 minutes every 3 to 4 hours while awake for the first 2 days. Put the ice in a plastic bag and place a towel between the bag of ice and your skin.  Rest the painful joint as much as possible, but continue to put the joint through a normal range of motion at least 4 times per day. When the pain lessens, begin normal, slow movements and usual activities to help prevent stiffness of the hip.  Only take over-the-counter or prescription medicines for pain, discomfort, or fever as directed by your caregiver.  Use crutches to limit weight bearing on the hip joint, if advised.  Elevate your painful hip to reduce swelling. Use pillows for propping and cushioning your legs and hips.  Gentle massage may provide comfort and decrease swelling. SEEK IMMEDIATE MEDICAL CARE IF:   Your pain increases even during treatment,  or you are not improving.  You have a fever.  You have heat and inflammation over the involved bursa.  You have any other questions or concerns. MAKE SURE YOU:   Understand these instructions.  Will watch your condition.  Will get help right away if you are not doing well or get worse. Document Released: 06/11/2001 Document Revised: 03/14/2011 Document Reviewed: 01/09/2008 [facility] Patient Information 2015 [place], [place]. This information is not intended to replace advice given to you by your health care provider. Make sure you discuss any questions you have with your health care provider.  Trochanteric Bursitis You have hip pain due to trochanteric bursitis. Bursitis means that the sack near the outside of the hip is filled with fluid and inflamed. This sack is made up of protective soft tissue. The pain from trochanteric bursitis can be severe and keep you from sleep. It can radiate to the buttocks or down the outside of the thigh to the knee. The pain is almost always worse when rising from the seated or lying position and with walking. Pain can improve after you take a few steps. It happens more often in people with hip joint and lumbar spine problems, such as arthritis or previous surgery. Very rarely the trochanteric bursa can become infected, and antibiotics and/or surgery may be needed. Treatment often includes an injection of local anesthetic mixed with cortisone medicine. This medicine is injected into the area where it is most tender over the hip. Repeat injections may be necessary if the response to  treatment is slow. You can apply ice packs over the tender area for 30 minutes every 2 hours for the next few days. Anti-inflammatory and/or narcotic pain medicine may also be helpful. Limit your activity for the next few days if the pain continues. See your caregiver in 5-10 days if you are not greatly improved.  SEEK IMMEDIATE MEDICAL CARE IF:  You develop severe pain, fever, or  increased redness.  You have pain that radiates below the knee. EXERCISES STRETCHING EXERCISES - Trochanteric Bursitis  These exercises may help you when beginning to rehabilitate your injury. Your symptoms may resolve with or without further involvement from your physician, physical therapist, or athletic trainer. While completing these exercises, remember:   Restoring tissue flexibility helps normal motion to return to the joints. This allows healthier, less painful movement and activity.  An effective stretch should be held for at least 30 seconds.  A stretch should never be painful. You should only feel a gentle lengthening or release in the stretched tissue. STRETCH - Iliotibial Band  On the floor or bed, lie on your side so your injured leg is on top. Bend your knee and grab your ankle.  Slowly bring your knee back so that your thigh is in line with your trunk. Keep your heel at your buttocks and gently arch your back so your head, shoulders and hips line up.  Slowly lower your leg so that your knee approaches the floor/bed until you feel a gentle stretch on the outside of your thigh. If you do not feel a stretch and your knee will not fall farther, place the heel of your opposite foot on top of your knee and pull your thigh down farther.  Hold this stretch for __________ seconds.  Repeat __________ times. Complete this exercise __________ times per day. STRETCH - Hamstrings, Supine   Lie on your back. Loop a belt or towel over the ball of your foot as shown.  Straighten your knee and slowly pull on the belt to raise your injured leg. Do not allow the knee to bend. Keep your opposite leg flat on the floor.  Raise the leg until you feel a gentle stretch behind your knee or thigh. Hold this position for __________ seconds.  Repeat __________ times. Complete this stretch __________ times per day. STRETCH - Quadriceps, Prone   Lie on your stomach on a firm surface, such as a bed  or padded floor.  Bend your knee and grasp your ankle. If you are unable to reach your ankle or pant leg, use a belt around your foot to lengthen your reach.  Gently pull your heel toward your buttocks. Your knee should not slide out to the side. You should feel a stretch in the front of your thigh and/or knee.  Hold this position for __________ seconds.  Repeat __________ times. Complete this stretch __________ times per day. STRETCHING - Hip Flexors, Lunge Half kneel with your knee on the floor and your opposite knee bent and directly over your ankle.  Keep good posture with your head over your shoulders. Tighten your buttocks to point your tailbone downward; this will prevent your back from arching too much.  You should feel a gentle stretch in the front of your thigh and/or hip. If you do not feel any resistance, slightly slide your opposite foot forward and then slowly lunge forward so your knee once again lines up over your ankle. Be sure your tailbone remains pointed downward.  Hold this stretch for  __________ seconds.  Repeat __________ times. Complete this stretch __________ times per day. STRETCH - Adductors, Lunge  While standing, spread your legs.  Lean away from your injured leg by bending your opposite knee. You may rest your hands on your thigh for balance.  You should feel a stretch in your inner thigh. Hold for __________ seconds.  Repeat __________ times. Complete this exercise __________ times per day. Document Released: 01/28/2004 Document Revised: 05/06/2013 Document Reviewed: 04/03/2008 [facility] Patient Information 2015 [place], [place]. This information is not intended to replace advice given to you by your health care provider. Make sure you discuss any questions you have with your health care provider. Sciatica Sciatica is pain, weakness, numbness, or tingling along the path of the sciatic nerve. The nerve starts in the lower back and runs down the back of each  leg. The nerve controls the muscles in the lower leg and in the back of the knee, while also providing sensation to the back of the thigh, lower leg, and the sole of your foot. Sciatica is a symptom of another medical condition. For instance, nerve damage or certain conditions, such as a herniated disk or bone spur on the spine, pinch or put pressure on the sciatic nerve. This causes the pain, weakness, or other sensations normally associated with sciatica. Generally, sciatica only affects one side of the body. CAUSES   Herniated or slipped disc.  Degenerative disk disease.  A pain disorder involving the narrow muscle in the buttocks (piriformis syndrome).  Pelvic injury or fracture.  Pregnancy.  Tumor (rare). SYMPTOMS  Symptoms can vary from mild to very severe. The symptoms usually travel from the low back to the buttocks and down the back of the leg. Symptoms can include:  Mild tingling or dull aches in the lower back, leg, or hip.  Numbness in the back of the calf or sole of the foot.  Burning sensations in the lower back, leg, or hip.  Sharp pains in the lower back, leg, or hip.  Leg weakness.  Severe back pain inhibiting movement. These symptoms may get worse with coughing, sneezing, laughing, or prolonged sitting or standing. Also, being overweight may worsen symptoms. DIAGNOSIS  Your caregiver will perform a physical exam to look for common symptoms of sciatica. He or she may ask you to do certain movements or activities that would trigger sciatic nerve pain. Other tests may be performed to find the cause of the sciatica. These may include:  Blood tests.  X-rays.  Imaging tests, such as an MRI or CT scan. TREATMENT  Treatment is directed at the cause of the sciatic pain. Sometimes, treatment is not necessary and the pain and discomfort goes away on its own. If treatment is needed, your caregiver may suggest:  Over-the-counter medicines to relieve pain.  Prescription  medicines, such as anti-inflammatory medicine, muscle relaxants, or narcotics.  Applying heat or ice to the painful area.  Steroid injections to lessen pain, irritation, and inflammation around the nerve.  Reducing activity during periods of pain.  Exercising and stretching to strengthen your abdomen and improve flexibility of your spine. Your caregiver may suggest losing weight if the extra weight makes the back pain worse.  Physical therapy.  Surgery to eliminate what is pressing or pinching the nerve, such as a bone spur or part of a herniated disk. HOME CARE INSTRUCTIONS   Only take over-the-counter or prescription medicines for pain or discomfort as directed by your caregiver.  Apply ice to the affected area  for 20 minutes, 3-4 times a day for the first 48-72 hours. Then try heat in the same way.  Exercise, stretch, or perform your usual activities if these do not aggravate your pain.  Attend physical therapy sessions as directed by your caregiver.  Keep all follow-up appointments as directed by your caregiver.  Do not wear high heels or shoes that do not provide proper support.  Check your mattress to see if it is too soft. A firm mattress may lessen your pain and discomfort. SEEK IMMEDIATE MEDICAL CARE IF:   You lose control of your bowel or bladder (incontinence).  You have increasing weakness in the lower back, pelvis, buttocks, or legs.  You have redness or swelling of your back.  You have a burning sensation when you urinate.  You have pain that gets worse when you lie down or awakens you at night.  Your pain is worse than you have experienced in the past.  Your pain is lasting longer than 4 weeks.  You are suddenly losing weight without reason. MAKE SURE YOU:  Understand these instructions.  Will watch your condition.  Will get help right away if you are not doing well or get worse. Document Released: 12/14/2000 Document Revised: 06/21/2011 Document  Reviewed: 05/01/2011 [facility] Patient Information 2015 [place], [place]. This information is not intended to replace advice given to you by your health care provider. Make sure you discuss any questions you have with your health care provider. Iliotibial Band Syndrome with Rehab The iliotibial (IT) band is a tendon that connects the hip muscles to the shinbone (tibia) and to one of the bones of the pelvis (ileum). The IT band passes by the knee and is often irritated by the outer portion of the knee (lateral femoral condyle). A fluid filled sac (bursa) exists between the tendon and the bone, to cushion and reduce friction. Overuse of the tendon may cause excessive friction, which results in IT band syndrome. This condition involves inflammation of the bursa (bursitis) and/or inflammation of the IT band (tendinitis). SYMPTOMS   Pain, tenderness, swelling, warmth, or redness over the IT band, at the outer knee (above the joint).  Pain that travels up or down the thigh or leg.  Initially, pain at the beginning of an exercise, that decreases once warmed up. Eventually, pain throughout the activity, getting worse as the activity continues. May cause the athlete to stop in the middle of training or competing.  Pain that gets worse when running down hills or stairs, on banked tracks, or next to the curb on the street.  Pain that increases when the foot of the affected leg hits the ground.  Possibly, a crackling sound (crepitation) when the tendon or bursa is moved or touched. CAUSES  IT band syndrome is caused by irritation of the IT band and the underlying bursa. This eventually results in inflammation and pain. IT band syndrome is an overuse injury.  RISK INCREASES WITH:  Sports with repetitive knee-bending activities (distance running, cycling).  Incorrect training techniques, including sudden changes in the intensity, frequency, or duration of training.  Not enough rest between  workouts.  Poor strength and flexibility, especially a tight IT band.  Failure to warm up properly before activity.  Bow legs.  Arthritis of the knee. PREVENTION   Warm up and stretch properly before activity.  Allow for adequate recovery between workouts.  Maintain physical fitness:  Strength, flexibility, and endurance.  Cardiovascular fitness.  Learn and use proper training technique, including reducing  running mileage, shortening stride, and avoiding running on hills and banked surfaces.  Wear arch supports (orthotics), if you have flat feet. PROGNOSIS  If treated properly, IT band syndrome usually goes away within 6 weeks of treatment. RELATED COMPLICATIONS   Longer healing time, if not properly treated, or if not given enough time to heal.  Recurring inflammation of the tendon and bursa, that may result in a chronic condition.  Recurring symptoms, if activity is resumed too soon, with overuse, with a direct blow, or with poor training technique.  Inability to complete training or competition. TREATMENT  Treatment first involves the use of ice and medicine, to reduce pain and inflammation. The use of strengthening and stretching exercises may help reduce pain with activity. These exercises may be performed at home or with a therapist. For individuals with flat feet, an arch support (orthotic) may be helpful. Some individuals find that wearing a knee sleeve or compression bandage around the knee during workouts provides some relief. Certain training techniques, such as adjusting stride length, avoiding running on hills or stairs, changing the direction you run on a circular or banked track, or changing the side of the road you run on, if you run next to the curb, may help decrease symptoms of IT band syndrome. Cyclists may need to change the seat height or foot position on their bicycles. An injection of cortisone into the bursa may be recommended. Surgery to remove the  inflamed bursa and/or part of the IT band is only considered after at least 6 months of non-surgical treatment.  MEDICATION   If pain medicine is needed, nonsteroidal anti-inflammatory medicines (aspirin and ibuprofen), or other minor pain relievers (acetaminophen), are often advised.  Do not take pain medicine for 7 days before surgery.  Prescription pain relievers may be given, if your caregiver thinks they are needed. Use only as directed and only as much as you need.  Corticosteroid injections may be given by your caregiver. These injections should be reserved for the most serious cases, because they may only be given a certain number of times. HEAT AND COLD  Cold treatment (icing) should be applied for 10 to 15 minutes every 2 to 3 hours for inflammation and pain, and immediately after activity that aggravates your symptoms. Use ice packs or an ice massage.  Heat treatment may be used before performing stretching and strengthening activities prescribed by your caregiver, physical therapist, or athletic trainer. Use a heat pack or a warm water soak. SEEK MEDICAL CARE IF:   Symptoms get worse or do not improve in 2 to 4 weeks, despite treatment.  New, unexplained symptoms develop. (Drugs used in treatment may produce side effects.) EXERCISES  RANGE OF MOTION (ROM) AND STRETCHING EXERCISES - Iliotibial Band Syndrome These exercises may help you when beginning to rehabilitate your injury. Your symptoms may go away with or without further involvement from your physician, physical therapist or athletic trainer. While completing these exercises, remember:   Restoring tissue flexibility helps normal motion to return to the joints. This allows healthier, less painful movement and activity.  An effective stretch should be held for at least 30 seconds.  A stretch should never be painful. You should only feel a gentle lengthening or release in the stretched tissue. STRETCH - Quadriceps, Prone    Lie on your stomach on a firm surface, such as a bed or padded floor.  Bend your right / left knee and grasp your ankle. If you are unable to reach your  ankle or pant leg, use a belt around your foot to lengthen your reach.  Gently pull your heel toward your buttocks. Your knee should not slide out to the side. You should feel a stretch in the front of your thigh and knee.  Hold this position for __________ seconds. Repeat __________ times. Complete this stretch __________ times per day.  STRETCH - Iliotibial Band  On the floor or bed, lie on your side, so your right / left leg is on top. Bend your knee and grab your ankle.  Slowly bring your knee back so that your thigh is in line with your trunk. Keep your heel at your buttocks and gently arch your back, so your head, shoulders and hips line up.  Slowly lower your leg so that your knee approaches the floor or bed, until you feel a gentle stretch on the outside of your right / left thigh. If you do not feel a stretch and your knee will not fall farther, place the heel of your opposite foot on top of your knee, and pull your thigh down farther.  Hold this stretch for __________ seconds. Repeat __________ times. Complete this stretch __________ times per day. STRENGTHENING EXERCISES - Iliotibial Band Syndrome Improving the flexibility of the IT band will best relieve your discomfort due to IT band syndrome. Strengthening exercises, however, can help improve both muscle endurance and joint mechanics, reducing the factors that can contribute to this condition. Your physician, physical therapist or athletic trainer may provide you with exercises that train specific muscle groups that are especially weak. The following exercises target muscles that are often weak in people who have IT band syndrome. STRENGTH - Hip Abductors, Straight Leg Raises  Be aware of your form throughout the entire exercise, so that you exercise the correct muscles. Poor  form means that you are not strengthening the correct muscles.  Lie on your side, so that your head, shoulders, knee and hip line up. You may bend your lower knee to help maintain your balance. Your right / left leg should be on top.  Roll your hips slightly forward, so that your hips are stacked directly over each other and your right / left knee is facing forward.  Lift your top leg up 4-6 inches, leading with your heel. Be sure that your foot does not drift forward and that your knee does not roll toward the ceiling.  Hold this position for __________ seconds. You should feel the muscles in your outer hip lifting (you may not notice this until your leg begins to tire).  Slowly lower your leg to the starting position. Allow the muscles to fully relax before beginning the next repetition. Repeat __________ times. Complete this exercise __________ times per day.  STRENGTH - Quad/VMO, Isometric  Sit in a chair with your right / left knee slightly bent. With your fingertips, feel the VMO muscle (just above the inside of your knee). The VMO is important in controlling the position of your kneecap.  Keeping your fingertips on this muscle. Without actually moving your leg, attempt to drive your knee down, as if straightening your leg. You should feel your VMO tense. If you have a difficult time, you may wish to try the same exercise on your healthy knee first.  Tense this muscle as hard as you can, without increasing any knee pain.  Hold for __________ seconds. Relax the muscles slowly and completely between each repetition. Repeat __________ times. Complete this exercise __________ times per day.  Document Released: 12/20/2004 Document Revised: 03/14/2011 Document Reviewed: 04/03/2008 [facility] Patient Information 2015 [place], [place]. This information is not intended to replace advice given to you by your health care provider. Make sure you discuss any questions you have with your health care  provider.

## 2014-03-07 NOTE — Progress Notes (Signed)
Subjective:    Patient ID: Sheila Tucker, female    DOB: 12-20-1950, 64 y.o.   MRN: [id number]  This chart was scribed for Shawnee Knapp, MD by Stephania Fragmin, ED Scribe. This patient was seen in room 25 and the patient's care was started at 3:19 PM.   HPI Chief Complaint  Patient presents with  . Back Pain    REFERRAL TO THERAPIST OF SPECIALIST   HPI Comments: Sheila Tucker is a 64 y.o. female who presents to the [facility] complaining of low back pain.   Low Back Pain: She states that she normally has intermittent, chronic low back pain that doesn't bother her. However, she reports that 3 weeks ago, she woke up with acute severe back pain.  It radiates from her mid sacrum and "sits in my outer hip; it's like having a toothache in my hip" down to her left knee. She states that she is unable to sleep well, and is only able to sleep 2-3 hours a night since laying down, particularly on the left side, significantly exacerbates the pain. She states that walking up or down stairs doesn't affect her pain.   Patient has tried cold packs, which moderately alleviates her pain. She has been using 3 tablets of ibuprofen a day, which she limits due to GERD, with some relief.   She has tried muscle relaxants and Vicodin, with no relief. She has also tried home exercises, with no relief. Patient had seen an orthopedist, Dr. Alphonzo Cruise, a long time ago, who ordered XRs but only told her to take ibuprofen for any of her aches or pains. She was recommended to go to a back specialist and physical therapist. She had also been to a chiropractor with no relief.  Patient denies any extremity weakness. She denies having tried Meloxicam.   Pulmonology: Patient had seen Dr. Melvyn Novas at [facility] and was prescribed Symbicort. After a week, she had side effects of severe diarrhea 15 times a day.   GI: After the diarrhea, patient had been to see Dr. Hilarie Fredrickson, a GI specialist, who told her she had diarrhea  lymphasitic micro colitis. She was prescribed Budesonide, which has "worked like Oceanographer."   Past Medical History  Diagnosis Date  . Asthma   . Allergy   . Cancer     uterine  . Cataract   . GERD (gastroesophageal reflux disease)   . Hypertension     past, under control, no meds  . Hyperlipidemia     patient denies high lipids  . Diverticulosis   . Tubular adenoma of colon   . Lymphocytic colitis    Current Outpatient Prescriptions on File Prior to Visit  Medication Sig Dispense Refill  . bismuth subsalicylate (PEPTO BISMOL) 262 MG/15ML suspension Take 30 mLs by mouth every 6 (six) hours as needed.    . budesonide-formoterol (SYMBICORT) 160-4.5 MCG/ACT inhaler Take 2 puffs first thing in am and then another 2 puffs about 12 hours later. 1 Inhaler 11  . albuterol (PROVENTIL HFA;VENTOLIN HFA) 108 (90 BASE) MCG/ACT inhaler Inhale 2 puffs into the lungs every 12 (twelve) hours.    . budesonide (ENTOCORT EC) 3 MG 24 hr capsule Take 3 capsules (9 mg total) by mouth daily. 90 capsule 2   No current facility-administered medications on file prior to visit.   Allergies  Allergen Reactions  . Tetanus Toxoid     REACTION: FEVER AND SWELLING    Review of Systems  Constitutional: Negative for fever  and chills.  HENT: Negative for rhinorrhea and sore throat.   Eyes: Negative for visual disturbance.  Cardiovascular: Negative for chest pain and leg swelling.  Gastrointestinal: Positive for diarrhea. Negative for nausea, vomiting and abdominal pain.  Genitourinary: Negative for dysuria.  Musculoskeletal: Positive for back pain and arthralgias.  Skin: Negative for rash.  Neurological: Negative for weakness and headaches.  Hematological: Does not bruise/bleed easily.         Objective:   Patient Vitals for the past 24 hrs:  BP Temp Temp src Pulse Resp SpO2 Height  03/07/14 1458 (!) 170/100 mmHg 98 F (36.7 C) Oral 88 18 94 % -     Physical Exam  Constitutional: She is oriented to  person, place, and time. She appears well-developed and well-nourished. No distress.  HENT:  Head: Normocephalic and atraumatic.  Eyes: Conjunctivae and EOM are normal.  Neck: Neck supple. No tracheal deviation present.  Cardiovascular: Normal rate.   Pulmonary/Chest: Effort normal. No respiratory distress.  Musculoskeletal: Normal range of motion.  Neurological: She is alert and oriented to person, place, and time.  Reflex Scores:      Patellar reflexes are 2+ on the right side and 1+ on the left side. Skin: Skin is warm and dry.  Psychiatric: She has a normal mood and affect. Her behavior is normal.  Nursing note and vitals reviewed.       Assessment & Plan:   Midline low back pain with left-sided sciatica - Plan: Ambulatory referral to Physical Therapy, Ambulatory referral to Orthopedic Surgery  Trochanteric bursitis of left hip - Plan: Ambulatory referral to Physical Therapy  Meds ordered this encounter  Medications  . meloxicam (MOBIC) 15 MG tablet    Sig: Take 1 tablet (15 mg total) by mouth daily.    Dispense:  30 tablet    Refill:  1  . DISCONTD: lidocaine (LIDODERM) 5 %    Sig: Place 1 patch onto the skin daily. Remove & Discard patch within 12 hours or as directed by MD    Dispense:  30 patch    Refill:  0  . lidocaine (LIDODERM) 5 %    Sig: Place 1 patch onto the skin daily. Remove & Discard patch within 12 hours or as directed by MD    Dispense:  30 patch    Refill:  0    I personally performed the services described in this documentation, which was scribed in my presence. The recorded information has been reviewed and considered, and addended by me as needed.  Delman Cheadle, MD MPH

## 2014-03-10 DIAGNOSIS — M545 Low back pain: Secondary | ICD-10-CM | POA: Diagnosis not present

## 2014-03-10 DIAGNOSIS — M9903 Segmental and somatic dysfunction of lumbar region: Secondary | ICD-10-CM | POA: Diagnosis not present

## 2014-03-13 ENCOUNTER — Encounter: Payer: Self-pay | Admitting: *Deleted

## 2014-03-21 ENCOUNTER — Ambulatory Visit (INDEPENDENT_AMBULATORY_CARE_PROVIDER_SITE_OTHER): Payer: Medicare Other | Admitting: Family Medicine

## 2014-03-21 VITALS — BP 158/88 | HR 98 | Temp 98.1°F | Resp 18 | Ht 68.0 in | Wt 289.4 lb

## 2014-03-21 DIAGNOSIS — N3000 Acute cystitis without hematuria: Secondary | ICD-10-CM | POA: Diagnosis not present

## 2014-03-21 DIAGNOSIS — R300 Dysuria: Secondary | ICD-10-CM

## 2014-03-21 LAB — POCT URINALYSIS DIPSTICK
Bilirubin, UA: NEGATIVE
Glucose, UA: NEGATIVE
KETONES UA: NEGATIVE
Nitrite, UA: NEGATIVE
PH UA: 6
Protein, UA: 100
Spec Grav, UA: 1.025
UROBILINOGEN UA: 0.2

## 2014-03-21 LAB — POCT UA - MICROSCOPIC ONLY
CRYSTALS, UR, HPF, POC: NEGATIVE
Casts, Ur, LPF, POC: NEGATIVE
Mucus, UA: NEGATIVE
RENAL TUBULAR CELLS: POSITIVE
Yeast, UA: NEGATIVE

## 2014-03-21 MED ORDER — CEPHALEXIN 500 MG PO CAPS: 500.0000 mg | ORAL_CAPSULE | Freq: Two times a day (BID) | ORAL | Status: DC

## 2014-03-21 NOTE — Patient Instructions (Signed)
Take the keflex antibiotic twice a day for one week and drink plenty of water.  Let me know if you do not feel better in the next 1-2 days and I will be in touch with your culture results You might try some OTC pyrudium (azo) for your symptoms while the antibiotic is working

## 2014-03-21 NOTE — Addendum Note (Signed)
Addended by: Constance Goltz on: 03/21/2014 10:06 AM   Modules accepted: Miquel Dunn

## 2014-03-21 NOTE — Progress Notes (Addendum)
[facility] [address], [place] [address] [phone]  Date:  03/21/2014   Name:  Sheila Tucker   DOB:  [birth year]   MRN:  [id number]  PCP:  Delman Cheadle, MD    Chief Complaint: Urinary Frequency and Dysuria   History of Present Illness:  Sheila Tucker is a 64 y.o. very pleasant female patient who presents with the following:  Here today with concern regarding UTI/  She has had these in the past, typical sx for 2-3 days.  She notes urinary frequency, urgency, dyuria.  No blood in her urine. She has not noted any fever or abd pain, no vomiting. She has back pain at baseline from her siatica  Patient Active Problem List   Diagnosis Date Noted  . Dyspnea 10/28/2013  . Colon polyps 07/09/2010  . Diverticulosis 07/09/2010  . HYPERTENSION 01/27/2009  . HYPERLIPIDEMIA 02/20/2007  . DYSMETABOLIC SYNDROME X 53/64/6803  . Asthma 02/20/2007  . GERD 02/20/2007    Past Medical History  Diagnosis Date  . Asthma   . Allergy   . Cancer     uterine  . Cataract   . GERD (gastroesophageal reflux disease)   . Hypertension     past, under control, no meds  . Hyperlipidemia     patient denies high lipids  . Diverticulosis   . Tubular adenoma of colon   . Lymphocytic colitis     Past Surgical History  Procedure Laterality Date  . Appendectomy    . Tonsillectomy    . Shoulder surgery  2004/2006    both shoulders, Dr Alphonzo Cruise  . Colonoscopy      tics and polyps (tubular adenomas), Neg in 2008  . Abdominal hysterectomy  2003    & BSO for uterine cancer  . Upper gastrointestinal endoscopy  2003    esophagitis, gastritis, duodenitis    History  Substance Use Topics  . Smoking status: Never Smoker   . Smokeless tobacco: Never Used     Comment: Patient never smoked, around second hand smoke  . Alcohol Use: No    Family History  Problem Relation Age of Onset  . Heart attack Father 57    Congential Valvular Heart disease  . Colon cancer Paternal  Grandfather   . Colon cancer Maternal Grandfather   . Breast cancer Paternal Aunt   . Diabetes Paternal Aunt   . Colon cancer Paternal Uncle     X 2  . Stroke Neg Hx     Allergies  Allergen Reactions  . Tetanus Toxoid     REACTION: FEVER AND SWELLING    Medication list has been reviewed and updated.  Current Outpatient Prescriptions on File Prior to Visit  Medication Sig Dispense Refill  . albuterol (PROVENTIL HFA;VENTOLIN HFA) 108 (90 BASE) MCG/ACT inhaler Inhale 2 puffs into the lungs every 12 (twelve) hours.    . budesonide (ENTOCORT EC) 3 MG 24 hr capsule Take 3 capsules (9 mg total) by mouth daily. 90 capsule 2  . budesonide-formoterol (SYMBICORT) 160-4.5 MCG/ACT inhaler Take 2 puffs first thing in am and then another 2 puffs about 12 hours later. 1 Inhaler 11  . lidocaine (LIDODERM) 5 % Place 1 patch onto the skin daily. Remove & Discard patch within 12 hours or as directed by MD 30 patch 0  . bismuth subsalicylate (PEPTO BISMOL) 262 MG/15ML suspension Take 30 mLs by mouth every 6 (six) hours as needed.    . meloxicam (MOBIC) 15 MG tablet Take 1  tablet (15 mg total) by mouth daily. (Patient not taking: Reported on 03/21/2014) 30 tablet 1   No current facility-administered medications on file prior to visit.    Review of Systems:  As per HPI- otherwise negative.   Physical Examination: Filed Vitals:   03/21/14 0859  BP: 158/88  Pulse: 98  Temp: 98.1 F (36.7 C)  Resp: 18   Filed Vitals:   03/21/14 0859  Height: 5\' 8"  (1.727 m)  Weight: 289 lb 6.4 oz (131.271 kg)   Body mass index is 44.01 kg/(m^2). Ideal Body Weight: Weight in (lb) to have BMI = 25: 164.1  GEN: WDWN, NAD, Non-toxic, A & O x 3, obese, looks well HEENT: Atraumatic, Normocephalic. Neck supple. No masses, No LAD. Ears and Nose: No external deformity. CV: RRR, No M/G/R. No JVD. No thrill. No extra heart sounds. PULM: CTA B, no wheezes, crackles, rhonchi. No retractions. No resp. distress. No  accessory muscle use. ABD: S, NT, ND. No CVA tenderness EXTR: No c/c/e NEURO Normal gait.  PSYCH: Normally interactive. Conversant. Not depressed or anxious appearing.  Calm demeanor.   Results for orders placed or performed in visit on 03/21/14  POCT UA - Microscopic Only  Result Value Ref Range   WBC, Ur, HPF, POC tntc    RBC, urine, microscopic tntc    Bacteria, U Microscopic 1+    Mucus, UA neg    Epithelial cells, urine per micros 1-4    Crystals, Ur, HPF, POC neg    Casts, Ur, LPF, POC neg    Yeast, UA neg    Renal tubular cells positive   POCT urinalysis dipstick  Result Value Ref Range   Color, UA yellow    Clarity, UA cloudy    Glucose, UA neg    Bilirubin, UA neg    Ketones, UA neg    Spec Grav, UA 1.025    Blood, UA large    pH, UA 6.0    Protein, UA 100    Urobilinogen, UA 0.2    Nitrite, UA neg    Leukocytes, UA moderate (2+)       Assessment and Plan: Acute cystitis without hematuria - Plan: cephALEXin (KEFLEX) 500 MG capsule, Urine culture  Dysuria - Plan: POCT UA - Microscopic Only, POCT urinalysis dipstick   Treat for UTI with keflex while culture is pending.  She will let me know if not better Signed Lamar Blinks, MD  Received sensitivity on 3/20: called and LMOM and mychart; need to change abx.  Sent in rx for cipro

## 2014-03-23 LAB — URINE CULTURE

## 2014-03-23 MED ORDER — CIPROFLOXACIN HCL 250 MG PO TABS: 250.0000 mg | ORAL_TABLET | Freq: Two times a day (BID) | ORAL | Status: DC

## 2014-03-23 NOTE — Addendum Note (Signed)
Addended by: Lamar Blinks C on: 03/23/2014 05:20 PM   Modules accepted: Orders, Medications

## 2014-03-25 ENCOUNTER — Ambulatory Visit (INDEPENDENT_AMBULATORY_CARE_PROVIDER_SITE_OTHER): Payer: Medicare Other | Admitting: Internal Medicine

## 2014-03-25 ENCOUNTER — Encounter: Payer: Self-pay | Admitting: Internal Medicine

## 2014-03-25 VITALS — BP 128/70 | HR 100 | Ht 67.25 in | Wt 289.5 lb

## 2014-03-25 DIAGNOSIS — K5289 Other specified noninfective gastroenteritis and colitis: Secondary | ICD-10-CM | POA: Diagnosis not present

## 2014-03-25 DIAGNOSIS — K52832 Lymphocytic colitis: Secondary | ICD-10-CM

## 2014-03-25 DIAGNOSIS — D126 Benign neoplasm of colon, unspecified: Secondary | ICD-10-CM | POA: Diagnosis not present

## 2014-03-25 MED ORDER — BUDESONIDE 3 MG PO CP24: 9.0000 mg | ORAL_CAPSULE | Freq: Every day | ORAL | Status: DC

## 2014-03-25 MED ORDER — BISMUTH SUBSALICYLATE 262 MG PO TABS: ORAL_TABLET | ORAL | Status: DC

## 2014-03-25 NOTE — Patient Instructions (Signed)
We have sent the following medications to your pharmacy for you to pick up at your convenience: Budesonide 9 mg daily Bismuth subsalicylate 270 mg-3 tabs three times daily  If you notices improvement in stool frequency and form, you may slowly wean off of your budesonide.  Please avoid NSAID's.  Please follow up with Dr Hilarie Fredrickson in 3 months.

## 2014-03-25 NOTE — Progress Notes (Signed)
Subjective:    Patient ID: Sheila Tucker, female    DOB: [birth year], 64 y.o.   MRN: [id number]  HPI  Sheila Tucker is a 64 yo female with PMH of lymphocytic colitis, tubular adenomas, hypertension, hyperlipidemia, GERD who seen in follow-up. She is here alone today. She came for colonoscopy on 01/23/2014 to evaluate unexplained diarrhea and history of colon polyps. This revealed 2 polyps removed from the sigmoid and descending colon. These were found to be tubular adenomas. There was moderate diverticulosis in the left colon and in the ascending colon. Random biopsies revealed lymphocytic colitis. She was started on budesonide 9 mg daily.  Prior to diagnosis she was having 10-20 bowel movements a day which were explosive, urgent and watery. This made working very difficult. Also woke her from sleep 4-5 times per night. With the budesonide she's had a great improvement. She's now having a proximally 4 stools per day which are mostly formed but not yet back to normal for her. The urgency has subsided. No abdominal pain. Good appetite. No weight loss. No trouble swallowing. No melena or rectal bleeding. Working has been much easier for her with improvement in her overall bowel movements. She is using ibuprofen approximately every other day for chronic active pain. Review of Systems  As per HPI, otherwise negative  Current Medications, Allergies, Past Medical History, Past Surgical History, Family History and Social History were reviewed in [employer] record.      Objective:   Physical Exam BP 128/70 mmHg  Pulse 100  Ht 5' 7.25" (1.708 m)  Wt 289 lb 8 oz (131.316 kg)  BMI 45.01 kg/m2 Constitutional: Well-developed and well-nourished. No distress. HEENT: Normocephalic and atraumatic. Oropharynx is clear and moist. No oropharyngeal exudate. Conjunctivae are normal.  No scleral icterus. Neck: Neck supple. Trachea midline. Cardiovascular: Normal rate, regular rhythm and intact  distal pulses. No M/R/G Pulmonary/chest: Effort normal and breath sounds normal. No wheezing, rales or rhonchi. Abdominal: Soft, nontender, nondistended. Bowel sounds active throughout. There are no masses palpable. No hepatosplenomegaly. Extremities: no clubbing, cyanosis, or edema Lymphadenopathy: No cervical adenopathy noted. Neurological: Alert and oriented to person place and time. Skin: Skin is warm and dry. No rashes noted. Psychiatric: Normal mood and affect. Behavior is normal.  CBC    Component Value Date/Time   WBC 7.3 12/03/2013 1004   RBC 5.44* 12/03/2013 1004   HGB 15.6* 12/03/2013 1004   HCT 47.4* 12/03/2013 1004   PLT 253.0 12/03/2013 1004   MCV 87.2 12/03/2013 1004   MCHC 32.9 12/03/2013 1004   RDW 14.6 12/03/2013 1004   LYMPHSABS 1.8 12/03/2013 1004   MONOABS 0.6 12/03/2013 1004   EOSABS 0.2 12/03/2013 1004   BASOSABS 0.0 12/03/2013 1004    CMP     Component Value Date/Time   NA 141 12/03/2013 1004   K 4.6 12/03/2013 1004   CL 102 12/03/2013 1004   CO2 30 12/03/2013 1004   GLUCOSE 98 12/03/2013 1004   BUN 8 12/03/2013 1004   CREATININE 0.8 12/03/2013 1004   CALCIUM 9.2 12/03/2013 1004   PROT 7.5 11/04/2011 0849   ALBUMIN 3.8 11/04/2011 0849   AST 30 11/04/2011 0849   ALT 26 11/04/2011 0849   ALKPHOS 55 11/04/2011 0849   BILITOT 0.7 11/04/2011 0849    Colonoscopy and path results reviewed together today     Assessment & Plan:   64 yo female with PMH of lymphocytic colitis, tubular adenomas, hypertension, hyperlipidemia, GERD who seen in follow-up.  1. Lymphocytic colitis -- etiology of diarrhea with improvement on budesonide therapy 9 mg a day. I do not think she is yet in remission as she states she will have return of diarrhea even if she is late in taking her medication daily. We discussed how ideally we would wean down budesonide as the disease becomes under better control. Medication is also very expensive for her but not exactly cost  prohibitive. We discussed other options and will add bismuth subsalicylate 568 mg 3 tablets 3 times a day. If this further improves her bowel movements then she can wean down the budesonide to 6 mg daily. She is given permission to wean budesonide down to off if she is able. I've asked that she avoid NSAIDs because these medications specifically can worsen microscopic colitis. Return in 3 months, sooner if necessary  2. Adenomatous colon polyps -- repeat colonoscopy recommended in 5 years.

## 2014-04-02 DIAGNOSIS — M5412 Radiculopathy, cervical region: Secondary | ICD-10-CM | POA: Diagnosis not present

## 2014-04-02 DIAGNOSIS — M5442 Lumbago with sciatica, left side: Secondary | ICD-10-CM | POA: Diagnosis not present

## 2014-04-03 ENCOUNTER — Other Ambulatory Visit: Payer: Self-pay | Admitting: Specialist

## 2014-04-03 DIAGNOSIS — M545 Low back pain: Secondary | ICD-10-CM

## 2014-04-25 ENCOUNTER — Ambulatory Visit
Admission: RE | Admit: 2014-04-25 | Discharge: 2014-04-25 | Disposition: A | Discharge status: Home / Self Care | Payer: Medicare Other | Source: Ambulatory Visit | Attending: Specialist | Admitting: Specialist

## 2014-04-25 DIAGNOSIS — M545 Low back pain: Secondary | ICD-10-CM

## 2014-06-30 ENCOUNTER — Ambulatory Visit: Payer: Medicare Other | Admitting: Internal Medicine

## 2014-06-30 ENCOUNTER — Other Ambulatory Visit: Payer: Self-pay

## 2015-01-28 ENCOUNTER — Other Ambulatory Visit: Payer: Self-pay

## 2015-01-28 ENCOUNTER — Ambulatory Visit (INDEPENDENT_AMBULATORY_CARE_PROVIDER_SITE_OTHER): Payer: Medicare Other | Admitting: Family Medicine

## 2015-01-28 VITALS — BP 134/88 | HR 86 | Temp 98.7°F | Resp 16 | Ht 69.0 in | Wt 291.6 lb

## 2015-01-28 DIAGNOSIS — Z1231 Encounter for screening mammogram for malignant neoplasm of breast: Secondary | ICD-10-CM

## 2015-01-28 DIAGNOSIS — J4520 Mild intermittent asthma, uncomplicated: Secondary | ICD-10-CM | POA: Diagnosis not present

## 2015-01-28 MED ORDER — ALBUTEROL SULFATE HFA 108 (90 BASE) MCG/ACT IN AERS: 2.0000 | INHALATION_SPRAY | Freq: Two times a day (BID) | RESPIRATORY_TRACT | Status: DC

## 2015-01-28 MED ORDER — BUDESONIDE-FORMOTEROL FUMARATE 160-4.5 MCG/ACT IN AERO: INHALATION_SPRAY | RESPIRATORY_TRACT | Status: DC

## 2015-01-28 NOTE — Progress Notes (Signed)
Subjective:    Patient ID: Sheila Tucker, female    DOB: 02-13-1950, 65 y.o.   MRN: [id number]  HPI This is a pleasant 65 yo female who presents this morning for refills of her symbicort and albuterol inhalers. She takes symbicort 2 puffs BID, and rarely uses albuterol- she has had same inhaler for nearly a year. She has been doing well on her current medications. She has seen Dr. Melvyn Novas in the past who has released her to PRN follow up. She continues to be exposed to second hand smoke from her husband who smokes in the house.   She was seen by Dr. Louanne Skye for her back pain and was unable to tolerate MRI. She was told that she has fused areas of her spine. She does not believe there is anything that can be done for it. She does not stretch, it causes pain. She has a treadmill that she does not use but is planning on using in the future. She is aware that she needs to lose weight, but struggles with sugar cravings when she is stressed.   She has an appointment with Dr. Brigitte Pulse for her physical 3/17.   Past Medical History  Diagnosis Date  . Asthma   . Allergy   . Cancer ([place])     uterine  . Cataract   . GERD (gastroesophageal reflux disease)   . Hypertension     past, under control, no meds  . Hyperlipidemia     patient denies high lipids  . Diverticulosis   . Tubular adenoma of colon   . Lymphocytic colitis    Past Surgical History  Procedure Laterality Date  . Appendectomy    . Tonsillectomy    . Shoulder surgery  2004/2006    both shoulders, Dr Alphonzo Cruise  . Colonoscopy      tics and polyps (tubular adenomas), Neg in 2008  . Abdominal hysterectomy  2003    & BSO for uterine cancer  . Upper gastrointestinal endoscopy  2003    esophagitis, gastritis, duodenitis   Family History  Problem Relation Age of Onset  . Heart attack Father 85    Congential Valvular Heart disease  . Colon cancer Paternal Grandfather   . Colon cancer Maternal Grandfather   . Breast cancer Paternal Aunt     . Diabetes Paternal Aunt   . Colon cancer Paternal Uncle     X 2  . Stroke Neg Hx    Social History   Social History  . Marital Status: Single    Spouse Name: N/A  . Number of Children: N/A  . Years of Education: N/A   Occupational History  . Not on file.   Social History Main Topics  . Smoking status: Never Smoker   . Smokeless tobacco: Never Used     Comment: Patient never smoked, around second hand smoke  . Alcohol Use: No  . Drug Use: No  . Sexual Activity: Not on file   Other Topics Concern  . Not on file   Social History Narrative     Review of Systems No SOB, no wheezing, no cough. No chest pain.     Objective:   Physical Exam  Constitutional: She is oriented to person, place, and time. She appears well-developed and well-nourished.  Morbidly obese  HENT:  Head: Normocephalic and atraumatic.  Right Ear: Tympanic membrane, external ear and ear canal normal.  Left Ear: Tympanic membrane, external ear and ear canal normal.  Nose: Nose normal.  Mouth/Throat: Oropharynx is clear and moist.  Eyes: Conjunctivae are normal.  Cardiovascular: Normal rate, regular rhythm and normal heart sounds.   Pulmonary/Chest: Effort normal and breath sounds normal. No respiratory distress. She has no wheezes. She has no rales.  Lymphadenopathy:    She has no cervical adenopathy.  Neurological: She is alert and oriented to person, place, and time.  Skin: Skin is warm and dry.  Psychiatric: She has a normal mood and affect. Her behavior is normal. Judgment and thought content normal.  Vitals reviewed.     BP 134/88 mmHg  Pulse 86  Temp(Src) 98.7 F (37.1 C) (Oral)  Resp 16  Ht 5\' 9"  (1.753 m)  Wt 291 lb 9.6 oz (132.269 kg)  BMI 43.04 kg/m2  SpO2 96%     Assessment & Plan:  1. Asthma, chronic, mild intermittent, uncomplicated - budesonide-formoterol (SYMBICORT) 160-4.5 MCG/ACT inhaler; Take 2 puffs first thing in am and then another 2 puffs about 12 hours later.   Dispense: 1 Inhaler; Refill: 11 - albuterol (PROVENTIL HFA;VENTOLIN HFA) 108 (90 Base) MCG/ACT inhaler; Inhale 2 puffs into the lungs every 12 (twelve) hours.  Dispense: 1 Inhaler; Refill: 2 - offered her flu vaccine and Prevnar 13 today- she declined  2. Encounter for screening mammogram for breast cancer - MM Digital Screening; Future  - follow up as scheduled with Dr. Brigitte Pulse 03/05/15  Clarene Reamer, FNP-BC  [facility]  01/28/2015 9:28 AM

## 2015-01-28 NOTE — Patient Instructions (Signed)
Please work on getting back to the treadmill Try to stretch more for your back Manage stress to help with sugar cravings.

## 2015-02-05 ENCOUNTER — Ambulatory Visit
Admission: RE | Admit: 2015-02-05 | Discharge: 2015-02-05 | Disposition: A | Discharge status: Home / Self Care | Payer: Medicare Other | Source: Ambulatory Visit | Attending: Family Medicine | Admitting: Family Medicine

## 2015-02-05 DIAGNOSIS — Z1231 Encounter for screening mammogram for malignant neoplasm of breast: Secondary | ICD-10-CM | POA: Diagnosis not present

## 2015-03-05 ENCOUNTER — Ambulatory Visit (INDEPENDENT_AMBULATORY_CARE_PROVIDER_SITE_OTHER): Payer: Medicare Other | Admitting: Family Medicine

## 2015-03-05 ENCOUNTER — Encounter: Payer: Self-pay | Admitting: Family Medicine

## 2015-03-05 VITALS — BP 146/84 | HR 80 | Temp 98.0°F | Resp 16 | Ht 69.5 in | Wt 295.6 lb

## 2015-03-05 DIAGNOSIS — J4550 Severe persistent asthma, uncomplicated: Secondary | ICD-10-CM

## 2015-03-05 DIAGNOSIS — B370 Candidal stomatitis: Secondary | ICD-10-CM

## 2015-03-05 MED ORDER — NYSTATIN 100000 UNIT/ML MT SUSP: 5.0000 mL | Freq: Four times a day (QID) | OROMUCOSAL | Status: DC

## 2015-03-05 MED ORDER — ALBUTEROL SULFATE HFA 108 (90 BASE) MCG/ACT IN AERS: 2.0000 | INHALATION_SPRAY | RESPIRATORY_TRACT | Status: DC | PRN

## 2015-03-05 NOTE — Patient Instructions (Signed)
Asthma, Adult Asthma is a recurring condition in which the airways tighten and narrow. Asthma can make it difficult to breathe. It can cause coughing, wheezing, and shortness of breath. Asthma episodes, also called asthma attacks, range from minor to life-threatening. Asthma cannot be cured, but medicines and lifestyle changes can help control it. CAUSES Asthma is believed to be caused by inherited (genetic) and environmental factors, but its exact cause is unknown. Asthma may be triggered by allergens, lung infections, or irritants in the air. Asthma triggers are different for each person. Common triggers include:   Animal dander.  Dust mites.  Cockroaches.  Pollen from trees or grass.  Mold.  Smoke.  Air pollutants such as dust, household cleaners, hair sprays, aerosol sprays, paint fumes, strong chemicals, or strong odors.  Cold air, weather changes, and winds (which increase molds and pollens in the air).  Strong emotional expressions such as crying or laughing hard.  Stress.  Certain medicines (such as aspirin) or types of drugs (such as beta-blockers).  Sulfites in foods and drinks. Foods and drinks that may contain sulfites include dried fruit, potato chips, and sparkling grape juice.  Infections or inflammatory conditions such as the flu, a cold, or an inflammation of the nasal membranes (rhinitis).  Gastroesophageal reflux disease (GERD).  Exercise or strenuous activity. SYMPTOMS Symptoms may occur immediately after asthma is triggered or many hours later. Symptoms include:  Wheezing.  Excessive nighttime or early morning coughing.  Frequent or severe coughing with a common cold.  Chest tightness.  Shortness of breath. DIAGNOSIS  The diagnosis of asthma is made by a review of your medical history and a physical exam. Tests may also be performed. These may include:  Lung function studies. These tests show how much air you breathe in and out.  Allergy  tests.  Imaging tests such as X-rays. TREATMENT  Asthma cannot be cured, but it can usually be controlled. Treatment involves identifying and avoiding your asthma triggers. It also involves medicines. There are 2 classes of medicine used for asthma treatment:   Controller medicines. These prevent asthma symptoms from occurring. They are usually taken every day.  Reliever or rescue medicines. These quickly relieve asthma symptoms. They are used as needed and provide short-term relief. Your health care provider will help you create an asthma action plan. An asthma action plan is a written plan for managing and treating your asthma attacks. It includes a list of your asthma triggers and how they may be avoided. It also includes information on when medicines should be taken and when their dosage should be changed. An action plan may also involve the use of a device called a peak flow meter. A peak flow meter measures how well the lungs are working. It helps you monitor your condition. HOME CARE INSTRUCTIONS   Take medicines only as directed by your health care provider. Speak with your health care provider if you have questions about how or when to take the medicines.  Use a peak flow meter as directed by your health care provider. Record and keep track of readings.  Understand and use the action plan to help minimize or stop an asthma attack without needing to seek medical care.  Control your home environment in the following ways to help prevent asthma attacks:  Do not smoke. Avoid being exposed to secondhand smoke.  Change your heating and air conditioning filter regularly.  Limit your use of fireplaces and wood stoves.  Get rid of pests (such as roaches   and mice) and their droppings.  Throw away plants if you see mold on them.  Clean your floors and dust regularly. Use unscented cleaning products.  Try to have someone else vacuum for you regularly. Stay out of rooms while they are  being vacuumed and for a short while afterward. If you vacuum, use a dust mask from a hardware store, a double-layered or microfilter vacuum cleaner bag, or a vacuum cleaner with a HEPA filter.  Replace carpet with wood, tile, or vinyl flooring. Carpet can trap dander and dust.  Use allergy-proof pillows, mattress covers, and box spring covers.  Wash bed sheets and blankets every week in hot water and dry them in a dryer.  Use blankets that are made of polyester or cotton.  Clean bathrooms and kitchens with bleach. If possible, have someone repaint the walls in these rooms with mold-resistant paint. Keep out of the rooms that are being cleaned and painted.  Wash hands frequently. SEEK MEDICAL CARE IF:   You have wheezing, shortness of breath, or a cough even if taking medicine to prevent attacks.  The colored mucus you cough up (sputum) is thicker than usual.  Your sputum changes from clear or white to yellow, green, gray, or bloody.  You have any problems that may be related to the medicines you are taking (such as a rash, itching, swelling, or trouble breathing).  You are using a reliever medicine more than 2-3 times per week.  Your peak flow is still at 50-79% of your personal best after following your action plan for 1 hour.  You have a fever. SEEK IMMEDIATE MEDICAL CARE IF:   You seem to be getting worse and are unresponsive to treatment during an asthma attack.  You are short of breath even at rest.  You get short of breath when doing very little physical activity.  You have difficulty eating, drinking, or talking due to asthma symptoms.  You develop chest pain.  You develop a fast heartbeat.  You have a bluish color to your lips or fingernails.  You are light-headed, dizzy, or faint.  Your peak flow is less than 50% of your personal best.   This information is not intended to replace advice given to you by your health care provider. Make sure you discuss any  questions you have with your health care provider.   Document Released: 12/20/2004 Document Revised: 09/10/2014 Document Reviewed: 07/19/2012 Elsevier Interactive Patient Education 2016 Elsevier Inc.  

## 2015-03-05 NOTE — Progress Notes (Signed)
Subjective:    Patient ID: Sheila Tucker, female    DOB: January 28, 1950, 65 y.o.   MRN: [id number]  Chief Complaint  Patient presents with  . THROAT ISSUE    Since using Symbicort burning in throat and on tongue x 2 weeks  . medication review    ppw for [employer] HFA-patient will explain    HPI She has used proair and proventil before with poor control of her asthma sxs - the ethanol component in the solvent - rarely has bad attacks, venolin works better [facility] [employer] is 1 1/2 to 2 yrs prior but gets severe quickly Husband smokes. On symbicort  Saw Dr. Louanne Skye who rec MRI by pt could not stand it and open MRI did not do well and so did not end up doing injections.  Eventually resolved on its own.  Told her that PT won't help because of the fusion.   Past Medical History  Diagnosis Date  . Asthma   . Allergy   . Cancer ([place])     uterine  . Cataract   . GERD (gastroesophageal reflux disease)   . Hypertension     past, under control, no meds  . Hyperlipidemia     patient denies high lipids  . Diverticulosis   . Tubular adenoma of colon   . Lymphocytic colitis    Past Surgical History  Procedure Laterality Date  . Appendectomy    . Tonsillectomy    . Shoulder surgery  2004/2006    both shoulders, Dr Alphonzo Cruise  . Colonoscopy      tics and polyps (tubular adenomas), Neg in 2008  . Abdominal hysterectomy  2003    & BSO for uterine cancer  . Upper gastrointestinal endoscopy  2003    esophagitis, gastritis, duodenitis   Current Outpatient Prescriptions on File Prior to Visit  Medication Sig Dispense Refill  . budesonide-formoterol (SYMBICORT) 160-4.5 MCG/ACT inhaler Take 2 puffs first thing in am and then another 2 puffs about 12 hours later. 1 Inhaler 11   No current facility-administered medications on file prior to visit.   Allergies  Allergen Reactions  . Tetanus Toxoid     REACTION: FEVER AND SWELLING   Family History  Problem Relation Age of Onset  . Heart  attack Father 56    Congential Valvular Heart disease  . Colon cancer Paternal Grandfather   . Colon cancer Maternal Grandfather   . Breast cancer Paternal Aunt   . Diabetes Paternal Aunt   . Colon cancer Paternal Uncle     X 2  . Stroke Neg Hx    Social History   Social History  . Marital Status: Single    Spouse Name: N/A  . Number of Children: N/A  . Years of Education: N/A   Social History Main Topics  . Smoking status: Never Smoker   . Smokeless tobacco: Never Used     Comment: Patient never smoked, around second hand smoke  . Alcohol Use: No  . Drug Use: No  . Sexual Activity: Not Asked   Other Topics Concern  . None   Social History Narrative     Review of Systems  Constitutional: Positive for fatigue. Negative for fever, chills, activity change and appetite change.  HENT: Positive for sore throat and trouble swallowing. Negative for voice change.   Respiratory: Negative for cough, chest tightness, shortness of breath and wheezing.   Cardiovascular: Negative for chest pain.  Musculoskeletal: Positive for myalgias, back pain and arthralgias.  Allergic/Immunologic: Positive for environmental allergies.  Hematological: Negative for adenopathy.  Psychiatric/Behavioral: Positive for sleep disturbance.       Objective:  BP 146/84 mmHg  Pulse 80  Temp(Src) 98 F (36.7 C) (Oral)  Resp 16  Ht 5' 9.5" (1.765 m)  Wt 295 lb 9.6 oz (134.083 kg)  BMI 43.04 kg/m2  SpO2 97%  Physical Exam  Constitutional: She is oriented to person, place, and time. She appears well-developed and well-nourished. No distress.  HENT:  Head: Normocephalic and atraumatic.  Right Ear: Tympanic membrane, external ear and ear canal normal.  Left Ear: Tympanic membrane, external ear and ear canal normal.  Nose: Nose normal. No mucosal edema or rhinorrhea.  Mouth/Throat: Uvula is midline and mucous membranes are normal. Posterior oropharyngeal erythema present. No oropharyngeal exudate or  posterior oropharyngeal edema.  Tongue with thick white plaque  Eyes: Conjunctivae are normal. Right eye exhibits no discharge. Left eye exhibits no discharge. No scleral icterus.  Neck: Normal range of motion. Neck supple.  Cardiovascular: Normal rate, regular rhythm, normal heart sounds and intact distal pulses.   Pulmonary/Chest: Effort normal and breath sounds normal.  Abdominal: Soft. Bowel sounds are normal. She exhibits no distension and no mass. There is no tenderness. There is no rebound and no guarding.  Lymphadenopathy:    She has no cervical adenopathy.  Neurological: She is alert and oriented to person, place, and time.  Skin: Skin is warm and dry. She is not diaphoretic. No erythema.  Psychiatric: She has a normal mood and affect. Her behavior is normal.          Assessment & Plan:  Will need to fil out a PA for pt's ventolin. 1. Asthma, severe persistent, uncomplicated   2. Thrush      Meds ordered this encounter  Medications  . nystatin (MYCOSTATIN) 100000 UNIT/ML suspension    Sig: Take 5 mLs (500,000 Units total) by mouth 4 (four) times daily.    Dispense:  560 mL    Refill:  0  . albuterol (PROVENTIL HFA;VENTOLIN HFA) 108 (90 Base) MCG/ACT inhaler    Sig: Inhale 2 puffs into the lungs every 4 (four) hours as needed for wheezing or shortness of breath.    Dispense:  18 g    Refill:  11    Needs brand name Ventolin HFA only - we can fill out PA for this    Delman Cheadle, MD MPH

## 2015-03-27 ENCOUNTER — Telehealth: Payer: Self-pay | Admitting: Family Medicine

## 2015-03-27 NOTE — Telephone Encounter (Signed)
LEFT A MESSAGE FOR PATIENT TO RETURN CALL.  NEED TO SCHEDULE AN ANNUAL EXAM WITH DEBBIE GESSNER

## 2015-03-30 ENCOUNTER — Telehealth: Payer: Self-pay | Admitting: Family Medicine

## 2015-03-30 NOTE — Telephone Encounter (Signed)
Patient returned call and scheduled her CPE for 08/13/15.

## 2015-04-13 ENCOUNTER — Telehealth: Payer: Self-pay

## 2015-04-13 NOTE — Telephone Encounter (Signed)
Msg is for Dr.Shaw, pt was checking on the status of her exemption form for ventilin to be sent to her insurance company. She states she switched insurances and that the ventilin is considered a Tier 3 and wanted her to be put on pro-air. She cannot take pro-air due to a specific ingredient that she cant do. Pt might come in tomorrow and ask about this as well.  Please advise  [phone]

## 2015-04-17 NOTE — Telephone Encounter (Signed)
PA was completed on covermymeds and is pending.

## 2015-04-20 NOTE — Telephone Encounter (Signed)
PA approved for Ventolin through 01/03/2016. Notified pt. (pt had failed both ProAir and Proventil in the past).

## 2015-04-20 NOTE — Telephone Encounter (Signed)
Received fax from [employer] (which is new ins in our system) stating that pt is not found as member in their plan. I called pharm and they have current ins as [employer] Medicare D ID # [id number], ph [phone]. Completed another PA on covermymeds for [employer]. Pending.

## 2015-08-07 ENCOUNTER — Other Ambulatory Visit: Payer: Self-pay | Admitting: Family Medicine

## 2015-08-07 DIAGNOSIS — J4520 Mild intermittent asthma, uncomplicated: Secondary | ICD-10-CM

## 2015-08-13 ENCOUNTER — Encounter: Payer: Self-pay | Admitting: Family Medicine

## 2015-08-20 ENCOUNTER — Encounter: Payer: Self-pay | Admitting: Family Medicine

## 2015-08-20 ENCOUNTER — Ambulatory Visit (INDEPENDENT_AMBULATORY_CARE_PROVIDER_SITE_OTHER): Payer: Medicare Other | Admitting: Family Medicine

## 2015-08-20 VITALS — BP 160/98 | HR 108 | Temp 98.6°F | Resp 17 | Ht 69.5 in | Wt 287.0 lb

## 2015-08-20 DIAGNOSIS — J4550 Severe persistent asthma, uncomplicated: Secondary | ICD-10-CM

## 2015-08-20 DIAGNOSIS — M5442 Lumbago with sciatica, left side: Secondary | ICD-10-CM | POA: Diagnosis not present

## 2015-08-20 DIAGNOSIS — R7303 Prediabetes: Secondary | ICD-10-CM

## 2015-08-20 DIAGNOSIS — E782 Mixed hyperlipidemia: Secondary | ICD-10-CM

## 2015-08-20 DIAGNOSIS — D751 Secondary polycythemia: Secondary | ICD-10-CM | POA: Diagnosis not present

## 2015-08-20 DIAGNOSIS — Z113 Encounter for screening for infections with a predominantly sexual mode of transmission: Secondary | ICD-10-CM

## 2015-08-20 DIAGNOSIS — Z0000 Encounter for general adult medical examination without abnormal findings: Secondary | ICD-10-CM | POA: Diagnosis not present

## 2015-08-20 DIAGNOSIS — E8881 Metabolic syndrome: Secondary | ICD-10-CM | POA: Diagnosis not present

## 2015-08-20 DIAGNOSIS — I10 Essential (primary) hypertension: Secondary | ICD-10-CM | POA: Diagnosis not present

## 2015-08-20 DIAGNOSIS — B370 Candidal stomatitis: Secondary | ICD-10-CM | POA: Diagnosis not present

## 2015-08-20 DIAGNOSIS — Z1389 Encounter for screening for other disorder: Secondary | ICD-10-CM

## 2015-08-20 DIAGNOSIS — Z1383 Encounter for screening for respiratory disorder NEC: Secondary | ICD-10-CM

## 2015-08-20 DIAGNOSIS — Z136 Encounter for screening for cardiovascular disorders: Secondary | ICD-10-CM

## 2015-08-20 DIAGNOSIS — G8929 Other chronic pain: Secondary | ICD-10-CM

## 2015-08-20 LAB — CBC
HEMATOCRIT: 49.1 % — AB (ref 35.0–45.0)
HEMOGLOBIN: 16.4 g/dL — AB (ref 11.7–15.5)
MCH: 28.8 pg (ref 27.0–33.0)
MCHC: 33.4 g/dL (ref 32.0–36.0)
MCV: 86.3 fL (ref 80.0–100.0)
MPV: 9.1 fL (ref 7.5–12.5)
Platelets: 258 10*3/uL (ref 140–400)
RBC: 5.69 MIL/uL — ABNORMAL HIGH (ref 3.80–5.10)
RDW: 15.6 % — AB (ref 11.0–15.0)
WBC: 7.1 10*3/uL (ref 3.8–10.8)

## 2015-08-20 LAB — COMPREHENSIVE METABOLIC PANEL
ALT: 30 U/L — ABNORMAL HIGH (ref 6–29)
AST: 40 U/L — ABNORMAL HIGH (ref 10–35)
Albumin: 3.9 g/dL (ref 3.6–5.1)
Alkaline Phosphatase: 52 U/L (ref 33–130)
BUN: 9 mg/dL (ref 7–25)
CHLORIDE: 101 mmol/L (ref 98–110)
CO2: 19 mmol/L — ABNORMAL LOW (ref 20–31)
Calcium: 9.1 mg/dL (ref 8.6–10.4)
Creat: 0.74 mg/dL (ref 0.50–0.99)
GLUCOSE: 77 mg/dL (ref 65–99)
POTASSIUM: 4 mmol/L (ref 3.5–5.3)
Sodium: 138 mmol/L (ref 135–146)
TOTAL PROTEIN: 7.3 g/dL (ref 6.1–8.1)
Total Bilirubin: 0.8 mg/dL (ref 0.2–1.2)

## 2015-08-20 LAB — TSH: TSH: 1.22 mIU/L

## 2015-08-20 LAB — LIPID PANEL
CHOL/HDL RATIO: 4.6 ratio (ref <=5.0)
CHOLESTEROL: 185 mg/dL (ref 125–200)
HDL: 40 mg/dL — ABNORMAL LOW (ref >=46)
LDL Cholesterol: 110 mg/dL (ref <130)
Triglycerides: 174 mg/dL — ABNORMAL HIGH (ref <150)
VLDL: 35 mg/dL — ABNORMAL HIGH (ref <30)

## 2015-08-20 LAB — HEMOGLOBIN A1C
HEMOGLOBIN A1C: 5.8 % — AB (ref <5.7)
MEAN PLASMA GLUCOSE: 120 mg/dL

## 2015-08-20 MED ORDER — ALPRAZOLAM 1 MG PO TABS: ORAL_TABLET | ORAL | 0 refills | Status: DC

## 2015-08-20 NOTE — Patient Instructions (Signed)
     IF you received an x-ray today, you will receive an invoice from [facility]. Please contact [facility] at [phone] with questions or concerns regarding your invoice.   IF you received labwork today, you will receive an invoice from [facility]. Please contact [facility] at [phone] with questions or concerns regarding your invoice.   Our billing staff will not be able to assist you with questions regarding bills from these companies.  You will be contacted with the lab results as soon as they are available. The fastest way to get your results is to activate your My Chart account. Instructions are located on the last page of this paperwork. If you have not heard from us regarding the results in 2 weeks, please contact this office.      

## 2015-08-20 NOTE — Progress Notes (Signed)
Subjective:   Chief Complaint  Patient presents with  . Annual Exam      Sheila Tucker is a 65 y.o. female who presents for a welcome to Medicare exam.   Cardiac risk factors: advanced age (older than 83 for men, 42 for women), obesity (BMI >= 30 kg/m2) and sedentary lifestyle. Pt states she normally has no problems with elevated blood pressure but attributes today's VS to the fact that she drank 2 pots of coffee on an empty stomach and then ate bacon and eggs for lunch.  Activities of Daily Living  In your present state of health, do you have any difficulty performing the following activities?:  Preparing food and eating?: No Bathing yourself: No Getting dressed: No Using the toilet:No Moving around from place to place: No In the past year have you fallen or had a near fall?:No  Current exercise habits: The patient does not participate in regular exercise at present.  She is very limited in her activity from her worsening chronic low back pain. Saw Dr. Louanne Skye - pt reports he informed her that her lumbar vertebrae were fused from the arthritis change and she needs MRI to see if she is a candidate for injections for pain relief.  Back is very debilitating, has to sleep in chair, can't stand for > 37min. 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  Went to Dr. Louanne Skye who told her that her back is messed up and could do injection but couldn't do that until she had an MRI which she couldn't tolerate. - this was an open MRI - she has tried it several times without success.  Does occ use a 5% lidocaine patch to help her back pain.  Also continues to have shoulder pain - s/p 2 shoulder arthroscopies and anticipates eventual need for additional shoulder surg   Dietary issues discussed: eats healthy but does turn to sugar for stress and sodas  Thrush did not respond to nystatin  Depression Screen Depression screen [facility] 2/9 08/20/2015  03/05/2015 01/28/2015 03/07/2014  Decreased Interest 0 0 0 0  Down, Depressed, Hopeless 0 0 0 0  PHQ - 2 Score 0 0 0 0    (Note: if answer to either of the following is "Yes", then a more complete depression screening is indicated)  Q1: Over the past two weeks, have you felt down, depressed or hopeless?no Q2: Over the past two weeks, have you felt little interest or pleasure in doing things? no   Care providers  Optho: Dr. Katy Fitch but has not been seen in several years. She is driving and denies prob with this. Has cataracts. Ortho: [facility]. Louanne Sky[facility] at [facility] GI: [facility] - Colonoscopy in 2016 was abnml with some polyps and biopsy showed lymphocytic microcolitis - was on budesonide for several months now in remission and can't take nsaids as triggers. Pulm: Wert for asthma which is much better; on prn ventolin and sched symbicort. No dentist - cannot afford. Has a temporary bridge in place for the past 15 yrs.  The following portions of the patient's history were reviewed and updated as appropriate: allergies, current medications, past family history, past medical history, past social history, past surgical history and problem list. Review of Systems Musculoskeletal:positive for back pain Allergic/Immunologic: positive for hay fever    Objective:       Visual Acuity Screening   Right eye Left eye Both eyes  Without correction:  With correction: 20 100 20 100 20 100   Blood pressure (!) 160/98, pulse (!) 108, temperature 98.6 F (37 C), temperature source Oral, resp. rate 17, height 5' 9.5" (1.765 m), weight 287 lb (130.2 kg), SpO2 96 %. Body mass index is 41.77 kg/m. BP (!) 160/98 (BP Location: Left Arm, Patient Position: Sitting, Cuff Size: Large)   Pulse (!) 108   Temp 98.6 F (37 C) (Oral)   Resp 17   Ht 5' 9.5" (1.765 m)   Wt 287 lb (130.2 kg)   SpO2 96%   BMI 41.77 kg/m   General Appearance:    Alert, cooperative, no distress, appears stated age  Head:    Normocephalic,  without obvious abnormality, atraumatic  Eyes:    PERRL, conjunctiva clear, EOM's intact  Ears:    Normal TM's and external ear canals, both ears  Nose:   Nares normal, septum midline, mucosa normal, no drainage    or sinus tenderness  Throat:   Lips, mucosa, and tongue normal; teeth and gums normal  Neck:   Supple, symmetrical, trachea midline, no adenopathy;    thyroid:  no enlargement/tenderness/nodules; no carotid   bruit or JVD  Back:     Symmetric, no curvature, ROM normal, no CVA tenderness  Lungs:     Clear to auscultation bilaterally, respirations unlabored  Chest Wall:    No tenderness or deformity   Heart:    Regular rate and rhythm, S1 and S2 normal, no murmur, rub   or gallop     Abdomen:     Soft, non-tender, bowel sounds active all four quadrants,    no masses, no organomegaly        Extremities:   Extremities normal, atraumatic, no cyanosis or edema  Pulses:   2+ and symmetric all extremities  Skin:   Skin color, texture, turgor normal, no rashes or lesions  Lymph nodes:   Cervical, supraclavicular, and axillary nodes normal  Neurologic:   CNII-XII grossly intact, normal gait     Breast and pelvic exam deferred  [facility] reading (PRIMARY) by  Dr. Brigitte Pulse. EKG: NSR, no ischemic changes  Assessment:      1. Medicare annual wellness visit, initial   2. Routine screening for STI (sexually transmitted infection)   3. Screening for cardiovascular, respiratory, and genitourinary diseases   4. Essential hypertension   5. Asthma, severe persistent, uncomplicated   6. HYPERLIPIDEMIA   7. Dysmetabolic syndrome X   8. Polycythemia, secondary   9. Prediabetes   10. Thrush - nystatin was ineffective, will try diflucan.  11.    Lumbago with left sciatica - was seen by Dr. Louanne Skye - recommended to have lumbar MRI to see if she is a candidate for injections but pt was unable to tolerate even the open MRI due to claustrophobia/anxiety - will retry when she can premedicate with xanax  and have husband drive      Plan:     During the course of the visit the patient was educated and counseled about appropriate screening and preventive services including:   Pt refuses to do any screening procedure where she is going to have to go somewhere - have an additional visit - such as the dexa.  Reviewed reasons for diagnosing/treating OP esp in re to her debilitating back and shoulder sxs but pt absolutely refuses to proceed with dexa.  She will cont with her mammogram every other year at [facility] and will consider the DEXA with her next mammogram in  2 yrs.  Refuses all vaccinations.  Patient Instructions (the written plan) was given to the patient.    Cannot give UA in office tsh nml 12/2013   Orders Placed This Encounter  Procedures  . Comprehensive metabolic panel    Order Specific Question:   Has the patient fasted?    Answer:   Yes  . Lipid panel    Order Specific Question:   Has the patient fasted?    Answer:   Yes  . CBC  . TSH  . Hemoglobin A1c  . Hepatitis C Antibody  . EKG 12-Lead    Meds ordered this encounter  Medications  . Lido-Capsaicin-Men-Methyl Sal 0.5-0.035-5-20 % PTCH    Sig: Apply topically.  . ALPRAZolam (XANAX) 1 MG tablet    Sig: Take 1 hr prior to procedure, may repeat every 30 minutes x 2 if needed.    Dispense:  10 tablet    Refill:  0  . fluconazole (DIFLUCAN) 100 MG tablet    Sig: Take 1 tablet (100 mg total) by mouth daily. For 2 weeks. Take 2 tabs on day 1    Dispense:  30 tablet    Refill:  0     Delman Cheadle, M.D.  [facility] [address] [place], [place] [address] [phone] phone [phone] fax  08/26/15 10:15 AM

## 2015-08-21 LAB — HEPATITIS C ANTIBODY: HCV AB: NEGATIVE

## 2015-08-21 MED ORDER — FLUCONAZOLE 100 MG PO TABS: 100.0000 mg | ORAL_TABLET | Freq: Every day | ORAL | 0 refills | Status: DC

## 2015-08-26 ENCOUNTER — Encounter: Payer: Self-pay | Admitting: Family Medicine

## 2015-08-27 ENCOUNTER — Ambulatory Visit (INDEPENDENT_AMBULATORY_CARE_PROVIDER_SITE_OTHER): Payer: Medicare Other | Admitting: Physician Assistant

## 2015-08-27 VITALS — BP 132/90 | HR 93 | Temp 97.6°F | Resp 17 | Ht 69.0 in | Wt 286.0 lb

## 2015-08-27 DIAGNOSIS — N390 Urinary tract infection, site not specified: Secondary | ICD-10-CM

## 2015-08-27 DIAGNOSIS — R350 Frequency of micturition: Secondary | ICD-10-CM | POA: Diagnosis not present

## 2015-08-27 LAB — POC MICROSCOPIC URINALYSIS (UMFC): MUCUS RE: ABSENT

## 2015-08-27 LAB — POCT URINALYSIS DIP (MANUAL ENTRY)
Glucose, UA: NEGATIVE
NITRITE UA: POSITIVE — AB
PH UA: 6
Protein Ur, POC: >=300 — AB
SPEC GRAV UA: 1.02
UROBILINOGEN UA: 1

## 2015-08-27 MED ORDER — NITROFURANTOIN MONOHYD MACRO 100 MG PO CAPS: 100.0000 mg | ORAL_CAPSULE | Freq: Two times a day (BID) | ORAL | 0 refills | Status: AC

## 2015-08-27 NOTE — Progress Notes (Signed)
Sheila Tucker  MRN: [id number] DOB: 07/31/1950  Subjective:  Sheila Tucker is a 65 y.o. female seen in office today for a chief complaint of urinary frequency x 2 days. Has associated feeling of not emptying bladder completely, dark yellow urine, and suprapubic discomfort. Denies hematuria, flank pain, vaginal bleeding and discharged. Has tried drinking more water with minimal relief.   Patient has history of UTIs and notes this feels exactly like the beginning of her typical UTI presentations.   Review of Systems  Constitutional: Negative for fatigue and fever.  Gastrointestinal: Negative for abdominal pain, nausea and vomiting.  Neurological: Negative for dizziness.  Psychiatric/Behavioral: Negative for confusion.    Patient Active Problem List   Diagnosis Date Noted  . Lymphocytic colitis 03/25/2014  . Dyspnea 10/28/2013  . Colon polyps 07/09/2010  . Diverticulosis 07/09/2010  . Essential hypertension 01/27/2009  . HYPERLIPIDEMIA 02/20/2007  . DYSMETABOLIC SYNDROME X [id number]  . Asthma 02/20/2007  . GERD 02/20/2007    Current Outpatient Prescriptions on File Prior to Visit  Medication Sig Dispense Refill  . albuterol (PROVENTIL HFA;VENTOLIN HFA) 108 (90 Base) MCG/ACT inhaler Inhale 2 puffs into the lungs every 4 (four) hours as needed for wheezing or shortness of breath. 18 g 11  . ALPRAZolam (XANAX) 1 MG tablet Take 1 hr prior to procedure, may repeat every 30 minutes x 2 if needed. 10 tablet 0  . budesonide-formoterol (SYMBICORT) 160-4.5 MCG/ACT inhaler Take 2 puffs first thing in am and then another 2 puffs about 12 hours later. 1 Inhaler 11  . fluconazole (DIFLUCAN) 100 MG tablet Take 1 tablet (100 mg total) by mouth daily. For 2 weeks. Take 2 tabs on day 1 30 tablet 0  . Lido-Capsaicin-Men-Methyl Sal 0.5-0.035-5-20 % PTCH Apply topically.     No current facility-administered medications on file prior to visit.     Allergies  Allergen Reactions  . Tetanus Toxoid    REACTION: FEVER AND SWELLING   Social History   Social History  . Marital status: Single    Spouse name: N/A  . Number of children: N/A  . Years of education: N/A   Occupational History  . Not on file.   Social History Main Topics  . Smoking status: Never Smoker  . Smokeless tobacco: Never Used     Comment: Patient never smoked, around second hand smoke  . Alcohol use No  . Drug use: No  . Sexual activity: Not on file   Other Topics Concern  . Not on file   Social History Narrative  . No narrative on file     Objective:  BP 132/90 (BP Location: Right Arm, Patient Position: Sitting, Cuff Size: Large)   Pulse 93   Temp 97.6 F (36.4 C) (Oral)   Resp 17   Ht 5\' 9"  (1.753 m)   Wt 286 lb (129.7 kg)   SpO2 94%   BMI 42.23 kg/m   Physical Exam  Constitutional: She is oriented to person, place, and time and well-developed, well-nourished, and in no distress.  HENT:  Head: Normocephalic and atraumatic.  Eyes: Conjunctivae are normal.  Neck: Normal range of motion.  Cardiovascular: Normal rate and regular rhythm.   Pulmonary/Chest: Effort normal.  Abdominal: Soft. Normal appearance and bowel sounds are normal. There is tenderness in the suprapubic area. There is no CVA tenderness.  Neurological: She is alert and oriented to person, place, and time. Gait normal.  Skin: Skin is warm and dry.  Psychiatric: Affect normal.  Vitals reviewed.  Results for orders placed or performed in visit on 08/27/15 (from the past 24 hour(s))  POCT Microscopic Urinalysis ([facility])     Status: Abnormal   Collection Time: 08/27/15 10:01 AM  Result Value Ref Range   WBC,UR,HPF,POC Too numerous to count  (A) None WBC/hpf   RBC,UR,HPF,POC Few (A) None RBC/hpf   Bacteria Moderate (A) None, Too numerous to count   Mucus Absent Absent   Epithelial Cells, UR Per Microscopy Many (A) None, Too numerous to count cells/hpf  POCT urinalysis dipstick     Status: Abnormal   Collection Time: 08/27/15  10:02 AM  Result Value Ref Range   Color, UA red (A) yellow   Clarity, UA cloudy (A) clear   Glucose, UA negative negative   Bilirubin, UA moderate (A) negative   Ketones, POC UA trace (5) (A) negative   Spec Grav, UA 1.020    Blood, UA large (A) negative   pH, UA 6.0    Protein Ur, POC >=300 (A) negative   Urobilinogen, UA 1.0    Nitrite, UA Positive (A) Negative   Leukocytes, UA small (1+) (A) Negative    Assessment and Plan :  1. Urinary frequency - POCT Microscopic Urinalysis ([facility]) - POCT urinalysis dipstick - Urine culture  2. Urinary tract infection, site not specified - nitrofurantoin, macrocrystal-monohydrate, (MACROBID) 100 MG capsule; Take 1 capsule (100 mg total) by mouth 2 (two) times daily.  Dispense: 10 capsule; Refill: 0 -Return to clinic if symptoms worsen, do not improve, or as needed   Tenna Delaine PA-C  [facility] 08/27/2015 10:06 AM

## 2015-08-27 NOTE — Patient Instructions (Addendum)
  Take antibiotic as prescribed. I will let you know your urine culture results -Return to clinic if symptoms worsen, do not improve, or as needed     IF you received an x-ray today, you will receive an invoice from [facility]. Please contact [facility] at [phone] with questions or concerns regarding your invoice.   IF you received labwork today, you will receive an invoice from [employer]. Please contact [facility] at [phone] with questions or concerns regarding your invoice.   Our billing staff will not be able to assist you with questions regarding bills from these companies.  You will be contacted with the lab results as soon as they are available. The fastest way to get your results is to activate your My Chart account. Instructions are located on the last page of this paperwork. If you have not heard from [place] regarding the results in 2 weeks, please contact this office.

## 2015-08-28 LAB — URINE CULTURE: Organism ID, Bacteria: 10000

## 2015-10-01 ENCOUNTER — Other Ambulatory Visit: Payer: Self-pay | Admitting: Family Medicine

## 2015-10-01 DIAGNOSIS — M4806 Spinal stenosis, lumbar region: Secondary | ICD-10-CM | POA: Diagnosis not present

## 2015-10-01 DIAGNOSIS — M4125 Other idiopathic scoliosis, thoracolumbar region: Secondary | ICD-10-CM | POA: Diagnosis not present

## 2015-10-03 ENCOUNTER — Other Ambulatory Visit (INDEPENDENT_AMBULATORY_CARE_PROVIDER_SITE_OTHER): Payer: Self-pay | Admitting: Specialist

## 2015-10-03 DIAGNOSIS — M5030 Other cervical disc degeneration, unspecified cervical region: Secondary | ICD-10-CM

## 2015-10-03 DIAGNOSIS — M48062 Spinal stenosis, lumbar region with neurogenic claudication: Secondary | ICD-10-CM

## 2015-10-03 DIAGNOSIS — G9519 Other vascular myelopathies: Secondary | ICD-10-CM

## 2015-10-03 NOTE — Telephone Encounter (Signed)
Please advise on refill of Diflucan 100mg .

## 2015-10-05 ENCOUNTER — Other Ambulatory Visit: Payer: Self-pay

## 2015-10-05 NOTE — Telephone Encounter (Signed)
Patient states she is still having issues with Thrush. Patient contacted her pharmacy requesting a refill. The pharmacy did not hear a response from [place]. Patient is request for a refill of fluconazole (DIFLUCAN) 100 MG tablet

## 2015-10-06 NOTE — Telephone Encounter (Signed)
Dr Tamala Julian, you saw pt for thrush in Aug and Rxd diflucan. Do you want to give RF?

## 2015-10-09 ENCOUNTER — Other Ambulatory Visit (INDEPENDENT_AMBULATORY_CARE_PROVIDER_SITE_OTHER): Payer: Self-pay | Admitting: Specialist

## 2015-10-09 DIAGNOSIS — M549 Dorsalgia, unspecified: Secondary | ICD-10-CM

## 2015-10-09 DIAGNOSIS — M48062 Spinal stenosis, lumbar region with neurogenic claudication: Secondary | ICD-10-CM

## 2015-10-09 DIAGNOSIS — M5030 Other cervical disc degeneration, unspecified cervical region: Secondary | ICD-10-CM

## 2015-10-09 DIAGNOSIS — G9519 Other vascular myelopathies: Secondary | ICD-10-CM

## 2015-10-09 NOTE — Telephone Encounter (Signed)
No, needs visit, see other request

## 2015-10-09 NOTE — Telephone Encounter (Signed)
If this is still an ongoing issue, my concern would be maybe it's not thrush. Even if it is, she has now failed 2 antifungals and so likely repeating the same thing is not going to work any better than it did the first time.  Recommend she come in for a scraping and KOH to confirm prescense of fungus and then can try something else if still +

## 2015-10-09 NOTE — Telephone Encounter (Signed)
Patient called in about this refill again today, I let her know what Dr Brigitte Pulse stated on the telephone message from 10/05/15, patient now has an appointment to see doctor on Wed 10/14/15 for the issue.

## 2015-10-12 NOTE — Telephone Encounter (Signed)
Called pt and she had already spoken to someone who gave her this message. She has appt for this week.

## 2015-10-14 ENCOUNTER — Ambulatory Visit (INDEPENDENT_AMBULATORY_CARE_PROVIDER_SITE_OTHER): Payer: Medicare Other | Admitting: Family Medicine

## 2015-10-14 ENCOUNTER — Other Ambulatory Visit: Payer: Medicare Other

## 2015-10-14 VITALS — BP 150/84 | HR 85 | Temp 98.3°F | Resp 18 | Ht 67.5 in | Wt 285.2 lb

## 2015-10-14 DIAGNOSIS — K146 Glossodynia: Secondary | ICD-10-CM

## 2015-10-14 DIAGNOSIS — K148 Other diseases of tongue: Secondary | ICD-10-CM | POA: Diagnosis not present

## 2015-10-14 LAB — POCT SKIN KOH: SKIN KOH, POC: NEGATIVE

## 2015-10-14 NOTE — Progress Notes (Addendum)
Subjective:  By signing my name below, I, Sheila Tucker, attest that this documentation has been prepared under the direction and in the presence of Delman Cheadle, MD Electronically Signed: Ladene Tucker, ED Scribe 10/14/2015 at 11:22 AM.   Patient ID: Sheila Tucker, female    DOB: 1950/06/06, 65 y.o.   MRN: [id number]  Chief Complaint  Patient presents with  . Follow-up    for thrush   HPI HPI Comments: Sheila Tucker is a 65 y.o. female who presents to the [facility] for a follow-up for thrush. Pt was seen for a wellness visit 2 months prior. She has a h/o lymphocytic microscopic colitis treated with bismuth subsalicylate for several months last year now in remission, cannot take NSAIDs. She also takes Symbicort scheduled for asthma with PRN ventolin. She has frequent seaonal allergies. She is unable to see her dentist. She had failed an extended course of nystatin 5 mL qid x1 month in March for thrush and so at her visit 2 weeks ago we placed her on diflucan 100 mg a day for 1 month and also got no improvement. She completed 5 day coarse of antibiotic for UTI 6 weeks prior.   She is currently experiencing a burning sensation on her tongue which is worse when she wakes in the mornings. Pt states that she brushes her teeth and tongue several times a day and has noticed skin peeling on the back edge of her tongue. She denies heartburn, indigestion, voice changes, productive cough, change in taste.   She went back to see Dr. Louanne Tucker as she felt like she needed a thoracic spine mri in addition to the lumbar and this has been ordered and is now arranged from 10/20.  She has so many problems with her discs with fusing, bulating, tilt and t and l, as  ewll as scoliosis and any surgery could make other parts worse.  The MRIs will give her knowledge for a baseline as further things happen.  He might be able to deaden the nerve but was worried that she has so many areas that need to be treated  that the injection won't help.  Tried meloxicam  Her company is her and her husband and she can't do this anymore  Lymphocytic microcolitis and can't take nsaids so she is staying on tumeric which really does help.  Past Medical History:  Diagnosis Date  . Allergy   . Asthma   . Cancer ([place])    uterine  . Cataract   . Diverticulosis   . GERD (gastroesophageal reflux disease)   . Hyperlipidemia    patient denies high lipids  . Hypertension    past, under control, no meds  . Lymphocytic colitis   . Tubular adenoma of colon    Current Outpatient Prescriptions on File Prior to Visit  Medication Sig Dispense Refill  . albuterol (PROVENTIL HFA;VENTOLIN HFA) 108 (90 Base) MCG/ACT inhaler Inhale 2 puffs into the lungs every 4 (four) hours as needed for wheezing or shortness of breath. 18 g 11  . ALPRAZolam (XANAX) 1 MG tablet Take 1 hr prior to procedure, may repeat every 30 minutes x 2 if needed. 10 tablet 0  . budesonide-formoterol (SYMBICORT) 160-4.5 MCG/ACT inhaler Take 2 puffs first thing in am and then another 2 puffs about 12 hours later. 1 Inhaler 11  . Lido-Capsaicin-Men-Methyl Sal 0.5-0.035-5-20 % PTCH Apply topically.     No current facility-administered medications on file prior to visit.    Allergies  Allergen Reactions  . Tetanus Toxoid     REACTION: FEVER AND SWELLING    Review of Systems  Constitutional: Negative for appetite change and unexpected weight change.  HENT: Positive for mouth sores. Negative for drooling, sore throat, trouble swallowing and voice change.        +thrush  Respiratory: Negative for cough.   Musculoskeletal: Positive for arthralgias, back pain, gait problem, joint swelling and myalgias.      Objective:   Physical Exam  Constitutional: She is oriented to person, place, and time. She appears well-developed and well-nourished. No distress.  HENT:  Head: Normocephalic and atraumatic.  1 cm plaque on the L lateral tongue. Normal skin  color.   Eyes: Conjunctivae and EOM are normal.  Neck: Neck supple. No tracheal deviation present.  Cardiovascular: Normal rate.   Pulmonary/Chest: Effort normal. No respiratory distress.  Musculoskeletal: Normal range of motion.  Neurological: She is alert and oriented to person, place, and time.  Skin: Skin is warm and dry.  Psychiatric: She has a normal mood and affect. Her behavior is normal.  Nursing note and vitals reviewed.  BP (!) 150/84 (BP Location: Right Arm, Patient Position: Sitting, Cuff Size: Large)   Pulse 85   Temp 98.3 F (36.8 C) (Oral)   Resp 18   Ht 5' 7.5" (1.715 m)   Wt 285 lb 3.2 oz (129.4 kg)   SpO2 95%   BMI 44.01 kg/m      Results for orders placed or performed in visit on 10/14/15  POCT Skin KOH  Result Value Ref Range   Skin KOH, POC Negative     Assessment & Plan:   1. Tongue lesion   2. Burning tongue   Has failed 1 mo of nystatin and 1 mo fluconazole w/o any improvement.  Unknown etiology - refer to ENT. Cons b12 with next labs  Orders Placed This Encounter  Procedures  . Ambulatory referral to ENT    Referral Priority:   Routine    Referral Type:   Consultation    Referral Reason:   Specialty Services Required    Requested Specialty:   Otolaryngology    Number of Visits Requested:   1  . POCT Skin KOH     I personally performed the services described in this documentation, which was scribed in my presence. The recorded information has been reviewed and considered, and addended by me as needed.   Delman Cheadle, M.D.  [facility] [address] [place], [place] [address] [phone] phone [phone] fax  10/15/15 9:57 PM

## 2015-10-14 NOTE — Patient Instructions (Addendum)
IF you received an x-ray today, you will receive an invoice from [facility]. Please contact [facility] at [phone] with questions or concerns regarding your invoice.   IF you received labwork today, you will receive an invoice from [employer]. Please contact [facility] at [phone] with questions or concerns regarding your invoice.   Our billing staff will not be able to assist you with questions regarding bills from these companies.  You will be contacted with the lab results as soon as they are available. The fastest way to get your results is to activate your My Chart account. Instructions are located on the last page of this paperwork. If you have not heard from [place] regarding the results in 2 weeks, please contact this office.     Glossitis Glossitis is inflammation of the tongue. This may be a stand-alone condition or it may be a symptom of a different condition that you have. Generally, glossitis goes away when its cause is identified and treated. Glossitis can be dangerous if it causes difficulty breathing. CAUSES  This condition can be caused by many different things. In some cases, the cause may not be known. Certain underlying conditions may cause glossitis, such as:   Viral, bacterial, or yeast infections.  Allergies.  Dysfunction of the skin and mucous membranes. This can happen with certain autoimmune disorders.  Abnormal tissue growths (tumors).  Lack of healthy red blood cells (anemia).  Movement of stomach acid into the tube that connects the mouth and the stomach (gastroesophageal reflux).  Lack of proper nutrition or certain vitamins.  Certain lifelong (chronic) medical conditions, such as diabetes. Sometimes, glossitis may not be caused by an underlying condition. In these cases, glossitis may be caused by:   Use of tobacco products, such as cigarettes, chewing tobacco, or e-cigarettes.  Excessive alcohol  use.  Tongue injury or irritation.  Certain medicines, such as medicines to treat cancer. RISK FACTORS This condition is more likely to develop in:   People who are 50 years or older.  Men.  People who are taking antibiotics or steroids, such as asthma medicines.  People who drink alcohol excessively.  People who use tobacco products, including cigarettes, chewing tobacco, or e-cigarettes.  People with chronic medical conditions, such as immune diseases or cancer.  People who do not brush or floss their teeth regularly.  People who lack proper nutrition or are anemic. SYMPTOMS  Symptoms of this condition vary depending on the cause. Symptoms may include:   Swelling of the tongue.  Pain and tenderness in the tongue. Sometimes, this condition is painless.  Changes in tongue color. The tongue may be pale or bright red.  Smooth areas on the tongue's surface.  A small mass of tissue (node) or white patch on the tongue.  Difficulty chewing, swallowing, or talking.  Difficulty breathing. DIAGNOSIS  This condition is diagnosed based on a physical exam and medical history. Your health care provider may ask you about your eating and drinking habits. You may have tests, including:   Blood tests.   Removal of a small amount of cells from the tongue that are examined under a microscope (biopsy).  You may be given the name of a dentist or a health care provider who specializes in ear, nose, and throat (ENT) problems (otolaryngologist). TREATMENT  Treatment for this condition depends on the underlying cause, and may include:   Following instructions from your health care provider about keeping your mouth clean and avoiding  irritants that may have caused your condition or made it worse.  Nutritional therapy. Your health care provider may tell you to change your eating and drinking habits or take a nutritional supplement.  Managing underlying conditions that may have caused your  glossitis.  Medicines, such as:  Corticosteroids to reduce inflammation.  Antibiotics if your condition was caused by an infection.  Local anestheticsthat numb your tongue or mouth (local anesthetics). HOME CARE INSTRUCTIONS  Keep your teeth and mouth clean. This includes brushing and flossing frequently and having regular dental checkups.  If you wear dentures or dental braces, work with your dentist to make sure they fit correctly.  Eat healthy foods. Follow instructions from your health care provider about eating or drinking restrictions.  Avoid tobacco products, including cigarettes, chewing tobacco, or e-cigarettes. If you need help quitting, ask your health care provider.  Avoid excessive alcohol use.  Avoid any irritants that may have caused your condition or made it worse, such as chemicals or certain foods.  Keep all follow-up visits as told by your health care provider. This is important. SEEK MEDICAL CARE IF:  You have a fever.  You develop new symptoms.  You have symptoms that do not get better with medicine or get worse.  You have symptoms that do not go away after 10 days.  You cannot eat or drink because of your pain. SEEK IMMEDIATE MEDICAL CARE IF:   You have severe pain or swelling.  You have difficulty breathing, swallowing, or talking.   This information is not intended to replace advice given to you by your health care provider. Make sure you discuss any questions you have with your health care provider.   Document Released: 12/10/2001 Document Revised: 09/10/2014 Document Reviewed: 05/07/2014 Elsevier Interactive Patient Education [employer].

## 2015-10-23 ENCOUNTER — Ambulatory Visit
Admission: RE | Admit: 2015-10-23 | Discharge: 2015-10-23 | Disposition: A | Discharge status: Home / Self Care | Payer: Medicare Other | Source: Ambulatory Visit | Attending: Specialist | Admitting: Specialist

## 2015-10-23 DIAGNOSIS — M549 Dorsalgia, unspecified: Secondary | ICD-10-CM

## 2015-10-23 DIAGNOSIS — M48062 Spinal stenosis, lumbar region with neurogenic claudication: Secondary | ICD-10-CM

## 2015-10-23 DIAGNOSIS — G9519 Other vascular myelopathies: Secondary | ICD-10-CM

## 2015-10-23 DIAGNOSIS — M5124 Other intervertebral disc displacement, thoracic region: Secondary | ICD-10-CM | POA: Diagnosis not present

## 2015-10-23 DIAGNOSIS — M5030 Other cervical disc degeneration, unspecified cervical region: Secondary | ICD-10-CM

## 2015-10-23 DIAGNOSIS — M48061 Spinal stenosis, lumbar region without neurogenic claudication: Secondary | ICD-10-CM | POA: Diagnosis not present

## 2015-11-06 ENCOUNTER — Ambulatory Visit (INDEPENDENT_AMBULATORY_CARE_PROVIDER_SITE_OTHER): Payer: Medicare Other | Admitting: Specialist

## 2015-11-06 ENCOUNTER — Encounter (INDEPENDENT_AMBULATORY_CARE_PROVIDER_SITE_OTHER): Payer: Self-pay | Admitting: Specialist

## 2015-11-06 VITALS — BP 150/91 | HR 71 | Ht 69.0 in | Wt 285.0 lb

## 2015-11-06 DIAGNOSIS — G8929 Other chronic pain: Secondary | ICD-10-CM

## 2015-11-06 DIAGNOSIS — M5136 Other intervertebral disc degeneration, lumbar region: Secondary | ICD-10-CM | POA: Diagnosis not present

## 2015-11-06 DIAGNOSIS — M5126 Other intervertebral disc displacement, lumbar region: Secondary | ICD-10-CM

## 2015-11-06 DIAGNOSIS — M545 Low back pain: Secondary | ICD-10-CM | POA: Diagnosis not present

## 2015-11-06 MED ORDER — TRAMADOL HCL 50 MG PO TABS: 100.0000 mg | ORAL_TABLET | Freq: Four times a day (QID) | ORAL | 0 refills | Status: DC | PRN

## 2015-11-06 NOTE — Patient Instructions (Addendum)
Avoid frequent bending and stooping  No lifting greater than 10 lbs. May use ice or moist heat for pain. Weight loss is of benefit. Handicap license is approved.   

## 2015-11-06 NOTE — Progress Notes (Addendum)
Office Visit Note   Patient: Sheila Tucker           Date of Birth: 1950/03/08           MRN: [id number] Visit Date: 11/06/2015              Requested by: Shawnee Knapp, MD [address] [place], [place] [address] PCP: Delman Cheadle, MD   Assessment & Plan: Visit Diagnoses:  1. Chronic bilateral low back pain without sciatica   2. Lumbar herniated disc   Today's discussion was about to a disc herniation that Sheila Tucker is having at the L4-5 level left side and causing her persistent ongoing pain with standing and ambulation. Initially she had pain in her back and sciatica of the left lower extremity but this has improved with conservative management and time. 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 She does not wish to undergo surgical treatment as she believes that her underlying multilevel degenerative disc disease and scoliosis can only be worsened with surgical intervention. I discussed this with Sheila Tucker and I think that conservative management is an appropriate way to deal with back pain if the patient is able to cope with the discomfort and does not require long-term narcotic medications for a condition that is surgically treatable. That is to say that I don't think that we should treat the patient with narcotic pain medicine when intervention will improve the pain pattern and allow them to avoid the use of narcotic medicines on a chronic basis. That being said Sheila Tucker has a problem with nonsteroidal anti-inflammatory agents causing her problems with her bowels and she is unable to take nonsteroidal anti-inflammatory agents. Normally this would be a primary way of managing her degenerative disc disease and back pain. In her case I think then it is reasonable to consider the use of tramadol to treat her  pain. Other considerations would be the use of Cymbalta. We have discussed whether or not a microdiscectomy could be carried out and I believe that it could decrease pain but her pain pattern now is one primarily of spinal stenosis pain associated with prolonged standing and ambulation which is in general better tolerated than sciatica pain. We will try tramadol to see if it's of benefit in diminishing her pain discomfort and continue to follow her to discern whether or not she is able to tolerate the use of smaller amounts of narcotic pain medicine for this condition.  Plan: Avoid frequent bending and stooping  No lifting greater than 10 lbs. May use ice or moist heat for pain. Weight loss is of benefit. Handicap license is approved.  Follow-Up Instructions: Return in about 4 weeks (around 12/04/2015) for lumbar evaluation.   Orders:  No orders of the defined types were placed in this encounter.  Meds ordered this encounter  Medications  . traMADol (ULTRAM) 50 MG tablet    Sig: Take 2 tablets (100 mg total) by mouth every 6 (six) hours as needed for moderate pain.    Dispense:  30 tablet    Refill:  0      Procedures: No procedures performed   Clinical Data: Findings:  MRI: Thoracic MRI shows noncompressive disc protrusions at several levels, no cord or thoracic root compression. MRI: Lumbar spine shows left sided disc herniation  at L4-5 with down turning of the extruded fragment and left L5 nerve compression.DDD nearly every level, Modic changes on both endplates at 075-GRM.    Subjective: Chief Complaint  Patient presents with  . Middle Back - Pain  . Lower Back - Pain    Patient is here to review MRI Thoracic Spine and MRI Lumbar Spine. Patient has not noticed any change in her condition. She wakes up crying some days.  Patient has lymphocyticmicrocholeisis. She is not able to take NSAIDS due to this condition. She is needing something to take where she can function as normal  atleast 3 days a month. She also requests handicap placard due to difficulty walking after parking so far away.  Patient has tried Tylenol with no relief.  Patient is not taking any medicines for pain at this time. 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 Can't take NSAIDs as she has colitis, diarrhea goes on for months with even a single dose of NSAIDs.    Review of Systems  Constitutional: Negative.   HENT: Negative.   Eyes: Negative.   Respiratory: Positive for cough and wheezing.   Cardiovascular: Negative.   Gastrointestinal: Negative.   Genitourinary: Negative.   Musculoskeletal: Positive for back pain and gait problem.  Skin: Negative.   Allergic/Immunologic: Negative for environmental allergies.  Hematological: Negative.   Psychiatric/Behavioral: Negative.      Objective: Vital Signs: BP (!) 150/91   Pulse 71   Ht 5\' 9"  (1.753 m)   Wt 285 lb (129.3 kg)   BMI 42.09 kg/m   Physical Exam  Constitutional: She is oriented to person, place, and time. She appears well-developed and well-nourished.  Eyes: EOM are normal. Pupils are equal, round, and reactive to light.  Neck: Normal range of motion. Neck supple.  Pulmonary/Chest: Effort normal and breath sounds normal.  Abdominal: Soft. Bowel sounds are normal.  Musculoskeletal: She exhibits edema and tenderness.  Neurological: She is alert and oriented to person, place, and time. She has normal reflexes.  Skin: Skin is warm and dry.  Psychiatric: She has a normal mood and affect. Her behavior is normal. Judgment and thought content normal.    Back Exam   Tenderness  The patient is experiencing tenderness in the lumbar.  Muscle Strength  Right Quadriceps:  5/5  Left Quadriceps:  5/5  Right Hamstrings:  5/5  Left Hamstrings:  5/5   Tests  Straight leg raise left:  positive  Other  Heel Walk: abnormal      Specialty Comments:  No specialty comments available.  Imaging: No results found.   PMFS History: Patient Active Problem List   Diagnosis Date Noted  . Lymphocytic colitis 03/25/2014  . Dyspnea 10/28/2013  . Colon polyps 07/09/2010  . Diverticulosis 07/09/2010  . Essential hypertension 01/27/2009  . HYPERLIPIDEMIA 02/20/2007  . DYSMETABOLIC SYNDROME X [id number]  . Asthma 02/20/2007  . GERD 02/20/2007   Past Medical History:  Diagnosis Date  . Allergy   . Asthma   . Cancer ([place])    uterine  . Cataract   . Diverticulosis   . GERD (gastroesophageal reflux disease)   . Hyperlipidemia    patient denies high lipids  . Hypertension    past, under control, no meds  . Lymphocytic colitis   . Tubular adenoma of colon     Family History  Problem  Relation Age of Onset  . Heart attack Father 63    Congential Valvular Heart disease  . Colon cancer Paternal Grandfather   . Colon cancer Maternal Grandfather   . Breast cancer Paternal Aunt   . Diabetes Paternal Aunt   . Colon cancer Paternal Uncle     X 2  . Stroke Neg Hx     Past Surgical History:  Procedure Laterality Date  . ABDOMINAL HYSTERECTOMY  2003   & BSO for uterine cancer  . APPENDECTOMY    . COLONOSCOPY     tics and polyps (tubular adenomas), Neg in 2008  . SHOULDER SURGERY  2004/2006   both shoulders, Dr Alphonzo Cruise  . TONSILLECTOMY    . UPPER GASTROINTESTINAL ENDOSCOPY  2003   esophagitis, gastritis, duodenitis   Social History   Occupational History  . Not on file.   Social History Main Topics  . Smoking status: Never Smoker  . Smokeless tobacco: Never Used     Comment: Patient never smoked, around second hand smoke  . Alcohol use No  . Drug use: No  . Sexual activity: Not on file

## 2015-11-09 DIAGNOSIS — K146 Glossodynia: Secondary | ICD-10-CM | POA: Diagnosis not present

## 2015-12-22 ENCOUNTER — Ambulatory Visit (INDEPENDENT_AMBULATORY_CARE_PROVIDER_SITE_OTHER): Payer: Medicare Other | Admitting: Family Medicine

## 2015-12-22 VITALS — BP 130/80 | HR 103 | Temp 98.7°F | Resp 17 | Ht 69.0 in | Wt 288.0 lb

## 2015-12-22 DIAGNOSIS — J019 Acute sinusitis, unspecified: Secondary | ICD-10-CM | POA: Diagnosis not present

## 2015-12-22 DIAGNOSIS — J4541 Moderate persistent asthma with (acute) exacerbation: Secondary | ICD-10-CM | POA: Diagnosis not present

## 2015-12-22 LAB — POCT CBC
Granulocyte percent: 73.2 %G (ref 37–80)
HEMATOCRIT: 43.9 % (ref 37.7–47.9)
Hemoglobin: 15.3 g/dL (ref 12.2–16.2)
LYMPH, POC: 3.1 (ref 0.6–3.4)
MCH, POC: 30.1 pg (ref 27–31.2)
MCHC: 34.8 g/dL (ref 31.8–35.4)
MCV: 86.5 fL (ref 80–97)
MID (CBC): 0.3 (ref 0–0.9)
MPV: 6.7 fL (ref 0–99.8)
POC GRANULOCYTE: 9.2 — AB (ref 2–6.9)
POC LYMPH %: 24.8 % (ref 10–50)
POC MID %: 2 % (ref 0–12)
Platelet Count, POC: 276 10*3/uL (ref 142–424)
RBC: 5.08 M/uL (ref 4.04–5.48)
RDW, POC: 15.1 %
WBC: 12.6 10*3/uL — AB (ref 4.6–10.2)

## 2015-12-22 MED ORDER — LIDO-CAPSAICIN-MEN-METHYL SAL 0.5-0.035-5-20 % EX PTCH: 1.0000 | MEDICATED_PATCH | Freq: Every day | CUTANEOUS | 11 refills | Status: DC

## 2015-12-22 MED ORDER — PREDNISONE 20 MG PO TABS
40.0000 mg | ORAL_TABLET | Freq: Every day | ORAL | 0 refills | Status: DC
Start: 2015-12-22 — End: 2016-02-19

## 2015-12-22 MED ORDER — IPRATROPIUM BROMIDE 0.02 % IN SOLN
0.5000 mg | Freq: Once | RESPIRATORY_TRACT | Status: AC
  Administered 2015-12-22: 0.5 mg via RESPIRATORY_TRACT

## 2015-12-22 MED ORDER — ALBUTEROL SULFATE (2.5 MG/3ML) 0.083% IN NEBU
2.5000 mg | INHALATION_SOLUTION | Freq: Once | RESPIRATORY_TRACT | Status: AC
  Administered 2015-12-22: 2.5 mg via RESPIRATORY_TRACT

## 2015-12-22 MED ORDER — AMOXICILLIN 875 MG PO TABS: 875.0000 mg | ORAL_TABLET | Freq: Two times a day (BID) | ORAL | 0 refills | Status: DC

## 2015-12-22 MED ORDER — FLUTICASONE PROPIONATE 50 MCG/ACT NA SUSP: 2.0000 | Freq: Every day | NASAL | 11 refills | Status: DC

## 2015-12-22 NOTE — Patient Instructions (Addendum)
   IF you received an x-ray today, you will receive an invoice from [facility]. Please contact [facility] at [phone] with questions or concerns regarding your invoice.   IF you received labwork today, you will receive an invoice from [facility]. Please contact [facility] at [phone] with questions or concerns regarding your invoice.   Our billing staff will not be able to assist you with questions regarding bills from these companies.  You will be contacted with the lab results as soon as they are available. The fastest way to get your results is to activate your My Chart account. Instructions are located on the last page of this paperwork. If you have not heard from us regarding the results in 2 weeks, please contact this office.      Asthma, Acute Bronchospasm Acute bronchospasm caused by asthma is also referred to as an asthma attack. Bronchospasm means your air passages become narrowed. The narrowing is caused by inflammation and tightening of the muscles in the air tubes (bronchi) in your lungs. This can make it hard to breathe or cause you to wheeze and cough. What are the causes? Possible triggers are:  Animal dander from the skin, hair, or feathers of animals.  Dust mites contained in house dust.  Cockroaches.  Pollen from trees or grass.  Mold.  Cigarette or tobacco smoke.  Air pollutants such as dust, household cleaners, hair sprays, aerosol sprays, paint fumes, strong chemicals, or strong odors.  Cold air or weather changes. Cold air may trigger inflammation. Winds increase molds and pollens in the air.  Strong emotions such as crying or laughing hard.  Stress.  Certain medicines such as aspirin or beta-blockers.  Sulfites in foods and drinks, such as dried fruits and wine.  Infections or inflammatory conditions, such as a flu, cold, or inflammation of the nasal membranes (rhinitis).  Gastroesophageal reflux disease (GERD). GERD is a  condition where stomach acid backs up into your esophagus.  Exercise or strenuous activity. What are the signs or symptoms?  Wheezing.  Excessive coughing, particularly at night.  Chest tightness.  Shortness of breath. How is this diagnosed? Your health care provider will ask you about your medical history and perform a physical exam. A chest X-ray or blood testing may be performed to look for other causes of your symptoms or other conditions that may have triggered your asthma attack. How is this treated? Treatment is aimed at reducing inflammation and opening up the airways in your lungs. Most asthma attacks are treated with inhaled medicines. These include quick relief or rescue medicines (such as bronchodilators) and controller medicines (such as inhaled corticosteroids). These medicines are sometimes given through an inhaler or a nebulizer. Systemic steroid medicine taken by mouth or given through an IV tube also can be used to reduce the inflammation when an attack is moderate or severe. Antibiotic medicines are only used if a bacterial infection is present. Follow these instructions at home:  Rest.  Drink plenty of liquids. This helps the mucus to remain thin and be easily coughed up. Only use caffeine in moderation and do not use alcohol until you have recovered from your illness.  Do not smoke. Avoid being exposed to secondhand smoke.  You play a critical role in keeping yourself in good health. Avoid exposure to things that cause you to wheeze or to have breathing problems.  Keep your medicines up-to-date and available. Carefully follow your health care provider's treatment plan.  Take your medicine exactly as prescribed.    When pollen or pollution is bad, keep windows closed and use an air conditioner or go to places with air conditioning.  Asthma requires careful medical care. See your health care provider for a follow-up as advised. If you are more than [redacted] weeks pregnant  and you were prescribed any new medicines, let your obstetrician know about the visit and how you are doing. Follow up with your health care provider as directed.  After you have recovered from your asthma attack, make an appointment with your outpatient doctor to talk about ways to reduce the likelihood of future attacks. If you do not have a doctor who manages your asthma, make an appointment with a primary care doctor to discuss your asthma. Get help right away if:  You are getting worse.  You have trouble breathing. If severe, call your local emergency services (911 in the [place]  You develop chest pain or discomfort.  You are vomiting.  You are not able to keep fluids down.  You are coughing up yellow, green, brown, or bloody sputum.  You have a fever and your symptoms suddenly get worse.  You have trouble swallowing. This information is not intended to replace advice given to you by your health care provider. Make sure you discuss any questions you have with your health care provider. Document Released: 04/06/2006 Document Revised: 06/03/2015 Document Reviewed: 06/27/2012 Elsevier Interactive Patient Education  2017 Elsevier Inc.  

## 2015-12-22 NOTE — Progress Notes (Signed)
Subjective:  By signing my name below, I, [name], attest that this documentation has been prepared under the direction and in the presence of Delman Cheadle, MD Electronically Signed: Ladene Artist, ED Scribe 12/22/2015 at 10:31 AM.   Patient ID: Sheila Tucker, female    DOB: Feb 18, 1950, 65 y.o.   MRN: [id number]  Chief Complaint  Patient presents with  . URI  . Shortness of Breath   HPI  HPI Comments: Sheila Tucker is a 65 y.o. female, with a h/o asthma, who presents to the [facility] complaining of allergy symptoms over the past 5-6 days. She noticed associated symptom of sinus pressure, HA, wheezing and dry cough onset 3-4 days ago. Pt also reports subjective fever and night sweats over the past few nights. She has tried nasal sprays, Mucinex, albuterol and Symbicort daily without relief.    ENT Pt states that she saw Dr. Janace Hoard at [facility] for a tongue burning sensation and was told that it was due to a metabolic issue.   Past Medical History:  Diagnosis Date  . Allergy   . Asthma   . Cancer ([place])    uterine  . Cataract   . Diverticulosis   . GERD (gastroesophageal reflux disease)   . Hyperlipidemia    patient denies high lipids  . Hypertension    past, under control, no meds  . Lymphocytic colitis   . Tubular adenoma of colon    Current Outpatient Prescriptions on File Prior to Visit  Medication Sig Dispense Refill  . albuterol (PROVENTIL HFA;VENTOLIN HFA) 108 (90 Base) MCG/ACT inhaler Inhale 2 puffs into the lungs every 4 (four) hours as needed for wheezing or shortness of breath. 18 g 11  . budesonide-formoterol (SYMBICORT) 160-4.5 MCG/ACT inhaler Take 2 puffs first thing in am and then another 2 puffs about 12 hours later. 1 Inhaler 11  . Lido-Capsaicin-Men-Methyl Sal 0.5-0.035-5-20 % PTCH Apply topically.     No current facility-administered medications on file prior to visit.    Allergies  Allergen Reactions  . Tetanus Toxoid    REACTION: FEVER AND SWELLING   Review of Systems  Constitutional: Positive for fever (subjective).  HENT: Positive for sinus pressure.   Respiratory: Positive for cough and wheezing.   Allergic/Immunologic: Positive for environmental allergies.  Neurological: Positive for headaches.      Objective:   Physical Exam  Constitutional: She is oriented to person, place, and time. She appears well-developed and well-nourished. No distress.  HENT:  Head: Normocephalic and atraumatic.  Right Ear: Tympanic membrane normal.  Left Ear: Tympanic membrane normal.  Nose: Nose normal.  Mouth/Throat: Oropharynx is clear and moist.  Eyes: Conjunctivae and EOM are normal.  Neck: Neck supple. No tracheal deviation present.  Cardiovascular: Regular rhythm, S1 normal and S2 normal.  Tachycardia present.   Pulmonary/Chest: Effort normal and breath sounds normal. No respiratory distress.  Musculoskeletal: Normal range of motion.  Neurological: She is alert and oriented to person, place, and time.  Skin: Skin is warm and dry.  Psychiatric: She has a normal mood and affect. Her behavior is normal.  Nursing note and vitals reviewed.  BP 130/80 (BP Location: Right Arm, Patient Position: Sitting, Cuff Size: Large)   Pulse (!) 103   Temp 98.7 F (37.1 C) (Oral)   Resp 17   Ht 5\' 9"  (1.753 m)   Wt 288 lb (130.6 kg)   SpO2 96%   BMI 42.53 kg/m    Results for orders placed  or performed in visit on 12/22/15  POCT CBC  Result Value Ref Range   WBC 12.6 (A) 4.6 - 10.2 K/uL   Lymph, poc 3.1 0.6 - 3.4   POC LYMPH PERCENT 24.8 10 - 50 %L   MID (cbc) 0.3 0 - 0.9   POC MID % 2.0 0 - 12 %M   POC Granulocyte 9.2 (A) 2 - 6.9   Granulocyte percent 73.2 37 - 80 %G   RBC 5.08 4.04 - 5.48 M/uL   Hemoglobin 15.3 12.2 - 16.2 g/dL   HCT, POC 43.9 37.7 - 47.9 %   MCV 86.5 80 - 97 fL   MCH, POC 30.1 27 - 31.2 pg   MCHC 34.8 31.8 - 35.4 g/dL   RDW, POC 15.1 %   Platelet Count, POC 276 142 - 424 K/uL   MPV 6.7 0  - 99.8 fL      Assessment & Plan:   1. Moderate persistent asthma with acute exacerbation   2. Acute sinusitis, recurrence not specified, unspecified location     Orders Placed This Encounter  Procedures  . POCT CBC    Meds ordered this encounter  Medications  . albuterol (PROVENTIL) (2.5 MG/3ML) 0.083% nebulizer solution 2.5 mg  . ipratropium (ATROVENT) nebulizer solution 0.5 mg  . predniSONE (DELTASONE) 20 MG tablet    Sig: Take 2 tablets (40 mg total) by mouth daily with breakfast.    Dispense:  10 tablet    Refill:  0  . amoxicillin (AMOXIL) 875 MG tablet    Sig: Take 1 tablet (875 mg total) by mouth 2 (two) times daily.    Dispense:  14 tablet    Refill:  0  . Lido-Capsaicin-Men-Methyl Sal 0.5-0.035-5-20 % PTCH    Sig: Apply 1 patch topically daily.    Dispense:  30 patch    Refill:  11  . fluticasone (FLONASE) 50 MCG/ACT nasal spray    Sig: Place 2 sprays into both nostrils at bedtime.    Dispense:  16 g    Refill:  11    I personally performed the services described in this documentation, which was scribed in my presence. The recorded information has been reviewed and considered, and addended by me as needed.   Delman Cheadle, M.D.  [facility] [address] [place], [place] [address] [phone] phone [phone] fax  01/04/16 6:53 PM

## 2015-12-31 ENCOUNTER — Telehealth: Payer: Self-pay

## 2015-12-31 NOTE — Telephone Encounter (Signed)
Patient is calling to request lidocain 5% patch rather than the Lido-capsaicin-men-methyl sal (salonpas) patch.  She states that the lidocain patch works a lot better for her.  Please advise  [phone]

## 2016-01-05 MED ORDER — LIDOCAINE 5 % EX PTCH: 1.0000 | MEDICATED_PATCH | CUTANEOUS | 5 refills | Status: DC

## 2016-01-05 NOTE — Telephone Encounter (Signed)
Sent in lidocaine 5% patch.

## 2016-01-05 NOTE — Telephone Encounter (Signed)
See previous note

## 2016-01-06 NOTE — Telephone Encounter (Signed)
Pt aware patches sent to [facility]

## 2016-01-07 NOTE — Telephone Encounter (Signed)
Lidocaine patch requires PA. Ins will not pay unless it is post herpetic nerve pain. Faxed info to [employer]

## 2016-01-07 NOTE — Telephone Encounter (Signed)
I do not see a h/o herpes zoster in her chart so assume it will be denied. She can buy otc lidocaine patches 4% - if she would like I'd be happy to send in a rx for this - her ins may not cover but would be tax free with rx.

## 2016-01-08 ENCOUNTER — Telehealth: Payer: Self-pay

## 2016-01-08 NOTE — Telephone Encounter (Signed)
PATIENT STATES SHE SAW DR. SHAW ABOUT 2 WEEKS AGO FOR A SINUS INFECTION. SHE PRESCRIBED HER PREDNISONE 20 MG, FLONASE AND AMOXICILLIN 875 MG. SHE SAID SHE FINISHED ALL OF HER MEDS, BUT AROUND NEW YEARS SHE STARTED TO GET THE COUGH AND SINUS DRAINAGE BACK. SHE THINKS SHE MAY NOT HAVE BEEN ON THE MEDICINE LONG ENOUGH. SHOULD SHE TAKE ANOTHER ROUND OF THE ANTIBOTIC? BEST PHONE [phone] (WORK) PHARMACY CHOICE IS [facility] ON [address]. [place]

## 2016-01-11 NOTE — Telephone Encounter (Signed)
Pt is feeling much better.

## 2016-01-11 NOTE — Telephone Encounter (Signed)
Pt advised.

## 2016-02-19 ENCOUNTER — Ambulatory Visit (INDEPENDENT_AMBULATORY_CARE_PROVIDER_SITE_OTHER): Payer: Medicare Other | Admitting: Physician Assistant

## 2016-02-19 VITALS — BP 120/72 | HR 86 | Temp 98.3°F | Resp 16 | Wt 292.0 lb

## 2016-02-19 DIAGNOSIS — R319 Hematuria, unspecified: Secondary | ICD-10-CM | POA: Diagnosis not present

## 2016-02-19 DIAGNOSIS — R350 Frequency of micturition: Secondary | ICD-10-CM

## 2016-02-19 DIAGNOSIS — N10 Acute tubulo-interstitial nephritis: Secondary | ICD-10-CM | POA: Diagnosis not present

## 2016-02-19 LAB — POCT CBC
Granulocyte percent: 76.2 %G (ref 37–80)
HEMATOCRIT: 45 % (ref 37.7–47.9)
HEMOGLOBIN: 15.4 g/dL (ref 12.2–16.2)
LYMPH, POC: 2.1 (ref 0.6–3.4)
MCH: 29.8 pg (ref 27–31.2)
MCHC: 34.3 g/dL (ref 31.8–35.4)
MCV: 86.8 fL (ref 80–97)
MID (cbc): 0.5 (ref 0–0.9)
MPV: 6.5 fL (ref 0–99.8)
POC GRANULOCYTE: 8.4 — AB (ref 2–6.9)
POC LYMPH PERCENT: 19 %L (ref 10–50)
POC MID %: 4.8 % (ref 0–12)
Platelet Count, POC: 256 10*3/uL (ref 142–424)
RBC: 5.18 M/uL (ref 4.04–5.48)
RDW, POC: 14.6 %
WBC: 11 10*3/uL — AB (ref 4.6–10.2)

## 2016-02-19 LAB — POCT URINALYSIS DIP (MANUAL ENTRY)
GLUCOSE UA: NEGATIVE
Ketones, POC UA: NEGATIVE
Nitrite, UA: POSITIVE — AB
Protein Ur, POC: >=300 — AB
SPEC GRAV UA: 1.025
Urobilinogen, UA: 1
pH, UA: 7

## 2016-02-19 LAB — POC MICROSCOPIC URINALYSIS (UMFC): Mucus: ABSENT

## 2016-02-19 LAB — POCT SEDIMENTATION RATE: POCT SED RATE: 26 mm/h — AB (ref 0–22)

## 2016-02-19 MED ORDER — CIPROFLOXACIN HCL 500 MG PO TABS: 500.0000 mg | ORAL_TABLET | Freq: Two times a day (BID) | ORAL | 0 refills | Status: AC

## 2016-02-19 MED ORDER — CEFTRIAXONE SODIUM 1 G IJ SOLR
1.0000 g | Freq: Once | INTRAMUSCULAR | Status: AC
  Administered 2016-02-19: 1 g via INTRAMUSCULAR

## 2016-02-19 NOTE — Patient Instructions (Addendum)
Start one dose of cipro this afternoon. Follow up with [facility] Barrows in the afternoon for repeat CBC and UA.   If over the night you develop any chills, nausea, vomiting, fever, or worsening pain, seek care immediately at the ER.    Pyelonephritis, Adult Introduction Pyelonephritis is a kidney infection. The kidneys are organs that help clean your blood by moving waste out of your blood and into your pee (urine). This infection can happen quickly, or it can last for a long time. In most cases, it clears up with treatment and does not cause other problems. Follow these instructions at home: Medicines  Take over-the-counter and prescription medicines only as told by your doctor.  Take your antibiotic medicine as told by your doctor. Do not stop taking the medicine even if you start to feel better. General instructions  Drink enough fluid to keep your pee clear or pale yellow.  Avoid caffeine, tea, and carbonated drinks.  Pee (urinate) often. Avoid holding in pee for long periods of time.  Pee before and after sex.  After pooping (having a bowel movement), women should wipe from front to back. Use each tissue only once.  Keep all follow-up visits as told by your doctor. This is important. Contact a doctor if:  You do not feel better after 2 days.  Your symptoms get worse.  You have a fever. Get help right away if:  You cannot take your medicine or drink fluids as told.  You have chills and shaking.  You throw up (vomit).  You have very bad pain in your side (flank) or back.  You feel very weak or you pass out (faint). This information is not intended to replace advice given to you by your health care provider. Make sure you discuss any questions you have with your health care provider. Document Released: 01/28/2004 Document Revised: 05/28/2015 Document Reviewed: 04/14/2014  2017 Elsevier    IF you received an x-ray today, you will receive an invoice from [facility]. Please contact [facility] at [phone] with questions or concerns regarding your invoice.   IF you received labwork today, you will receive an invoice from [facility]. Please contact [facility] at [phone] with questions or concerns regarding your invoice.   Our billing staff will not be able to assist you with questions regarding bills from these companies.  You will be contacted with the lab results as soon as they are available. The fastest way to get your results is to activate your My Chart account. Instructions are located on the last page of this paperwork. If you have not heard from [place] regarding the results in 2 weeks, please contact this office.

## 2016-02-19 NOTE — Progress Notes (Signed)
02/19/2016 at 2:16 PM  Sheila Tucker / DOB: 04/23/1950 / MRN: [id number]  The patient has HYPERLIPIDEMIA; DYSMETABOLIC SYNDROME X; Essential hypertension; Asthma; GERD; Colon polyps; Diverticulosis; Dyspnea; and Lymphocytic colitis on her problem list.  SUBJECTIVE  Sheila Tucker is a 66 y.o. female who complains of hematuria, urinary frequency, abdominal pain and urinary hesitancy x 2 days. She denies dysuria, flank pain and vaginal discharge. Has not tried anything for relief. Most recent UTI prior to this was 08/2015. Pt does not smoke but she is exposed to second hand smoke per husband. History of uterine cancer 2006, s/p total hysterectomy. Denies past sore throat or recent illness.   She  has a past medical history of Allergy; Asthma; Cancer ([place]); Cataract; Diverticulosis; GERD (gastroesophageal reflux disease); Hyperlipidemia; Hypertension; Lymphocytic colitis; and Tubular adenoma of colon.    Medications reviewed and updated by myself where necessary, and exist elsewhere in the encounter.   Ms. Redmond is allergic to tetanus toxoid. She  reports that she has never smoked. She has never used smokeless tobacco. She reports that she does not drink alcohol or use drugs. She  has no sexual activity history on file. The patient  has a past surgical history that includes Appendectomy; Tonsillectomy; Shoulder surgery (2004/2006); Colonoscopy; Abdominal hysterectomy (2003); and Upper gastrointestinal endoscopy (2003).  Her family history includes Breast cancer in her paternal aunt; Colon cancer in her maternal grandfather, paternal grandfather, and paternal uncle; Diabetes in her paternal aunt; Heart attack (age of onset: 37) in her father.  Review of Systems  Constitutional: Negative for chills, diaphoresis and fever.  Gastrointestinal: Negative for abdominal pain, nausea and vomiting.    OBJECTIVE  Her  weight is 292 lb (132.5 kg). Her oral temperature is 98.3 F (36.8 C). Her blood pressure is  120/72 and her pulse is 86. Her respiration is 16 and oxygen saturation is 96%.  The patient's body mass index is 43.12 kg/m.  Physical Exam  Constitutional: She is oriented to person, place, and time. She appears well-developed and well-nourished. No distress.  HENT:  Head: Normocephalic and atraumatic.  Eyes: Conjunctivae are normal.  Neck: Normal range of motion.  Cardiovascular: Normal rate, regular rhythm and normal heart sounds.   Respiratory: Effort normal.  GI: Soft. Normal appearance and bowel sounds are normal. There is tenderness in the epigastric area. There is no CVA tenderness.  Neurological: She is alert and oriented to person, place, and time.  Skin: Skin is warm and dry.  Psychiatric: She has a normal mood and affect.    Results for orders placed or performed in visit on 02/19/16 (from the past 24 hour(s))  POCT urinalysis dipstick     Status: Abnormal   Collection Time: 02/19/16  9:31 AM  Result Value Ref Range   Color, UA brown (A) yellow   Clarity, UA cloudy (A) clear   Glucose, UA negative negative   Bilirubin, UA small (A) negative   Ketones, POC UA negative negative   Spec Grav, UA 1.025    Blood, UA large (A) negative   pH, UA 7.0    Protein Ur, POC >=300 (A) negative   Urobilinogen, UA 1.0    Nitrite, UA Positive (A) Negative   Leukocytes, UA small (1+) (A) Negative  POCT Microscopic Urinalysis ([facility])     Status: Abnormal   Collection Time: 02/19/16  9:32 AM  Result Value Ref Range   WBC,UR,HPF,POC Many (A) None WBC/hpf   RBC,UR,HPF,POC Too numerous to count  (A) None  RBC/hpf   Bacteria None None, Too numerous to count   Mucus Absent Absent   Epithelial Cells, UR Per Microscopy Moderate (A) None, Too numerous to count cells/hpf  POCT CBC     Status: Abnormal   Collection Time: 02/19/16 10:17 AM  Result Value Ref Range   WBC 11.0 (A) 4.6 - 10.2 K/uL   Lymph, poc 2.1 0.6 - 3.4   POC LYMPH PERCENT 19.0 10 - 50 %L   MID (cbc) 0.5 0 - 0.9   POC MID  % 4.8 0 - 12 %M   POC Granulocyte 8.4 (A) 2 - 6.9   Granulocyte percent 76.2 37 - 80 %G   RBC 5.18 4.04 - 5.48 M/uL   Hemoglobin 15.4 12.2 - 16.2 g/dL   HCT, POC 45.0 37.7 - 47.9 %   MCV 86.8 80 - 97 fL   MCH, POC 29.8 27 - 31.2 pg   MCHC 34.3 31.8 - 35.4 g/dL   RDW, POC 14.6 %   Platelet Count, POC 256 142 - 424 K/uL   MPV 6.5 0 - 99.8 fL  POCT SEDIMENTATION RATE     Status: Abnormal   Collection Time: 02/19/16 11:20 AM  Result Value Ref Range   POCT SED RATE 26 (A) 0 - 22 mm/hr    ASSESSMENT & PLAN  Delecia was seen today for urinary frequency.  Diagnoses and all orders for this visit:  Urinary frequency -     POCT urinalysis dipstick -     POCT Microscopic Urinalysis ([facility]) -     POCT SEDIMENTATION RATE -     POCT CBC -     CMP14+EGFR -     Urine culture  Hematuria, unspecified type  Acute pyelonephritis -     cefTRIAXone (ROCEPHIN) injection 1 g; Inject 1 g into the muscle once. -     ciprofloxacin (CIPRO) 500 MG tablet; Take 1 tablet (500 mg total) by mouth 2 (two) times daily.   Pt instructed to return tomorrow for reevaluation of NP Harris. If any symptoms worsen, or she develops new fever, chills, nausea, vomiting, or flank pain go to ED immediately.   Tenna Delaine, PA-C [facility] 02/19/2016 2:16 PM

## 2016-02-20 ENCOUNTER — Ambulatory Visit (INDEPENDENT_AMBULATORY_CARE_PROVIDER_SITE_OTHER): Payer: Medicare Other | Admitting: Family Medicine

## 2016-02-20 VITALS — BP 148/76 | HR 84 | Temp 98.2°F | Resp 16 | Ht 69.0 in | Wt 296.0 lb

## 2016-02-20 DIAGNOSIS — N10 Acute tubulo-interstitial nephritis: Secondary | ICD-10-CM

## 2016-02-20 DIAGNOSIS — R350 Frequency of micturition: Secondary | ICD-10-CM | POA: Diagnosis not present

## 2016-02-20 LAB — POCT CBC
Granulocyte percent: 60 %G (ref 37–80)
HCT, POC: 42.1 % (ref 37.7–47.9)
Hemoglobin: 14.5 g/dL (ref 12.2–16.2)
LYMPH, POC: 2.4 (ref 0.6–3.4)
MCH, POC: 29.9 pg (ref 27–31.2)
MCHC: 34.5 g/dL (ref 31.8–35.4)
MCV: 86.5 fL (ref 80–97)
MID (CBC): 0.4 (ref 0–0.9)
MPV: 6.3 fL (ref 0–99.8)
POC Granulocyte: 4.1 (ref 2–6.9)
POC LYMPH PERCENT: 34.7 %L (ref 10–50)
POC MID %: 5.3 % (ref 0–12)
Platelet Count, POC: 262 10*3/uL (ref 142–424)
RBC: 4.87 M/uL (ref 4.04–5.48)
RDW, POC: 14.6 %
WBC: 6.9 10*3/uL (ref 4.6–10.2)

## 2016-02-20 LAB — CMP14+EGFR
A/G RATIO: 1.4 (ref 1.2–2.2)
ALBUMIN: 3.7 g/dL (ref 3.6–4.8)
ALT: 18 IU/L (ref 0–32)
AST: 20 IU/L (ref 0–40)
Alkaline Phosphatase: 68 IU/L (ref 39–117)
BUN / CREAT RATIO: 17 (ref 12–28)
BUN: 13 mg/dL (ref 8–27)
Bilirubin Total: 0.4 mg/dL (ref 0.0–1.2)
CALCIUM: 9.4 mg/dL (ref 8.7–10.3)
CO2: 26 mmol/L (ref 18–29)
Chloride: 97 mmol/L (ref 96–106)
Creatinine, Ser: 0.77 mg/dL (ref 0.57–1.00)
GFR, EST AFRICAN AMERICAN: 92 mL/min/{1.73_m2} (ref >59)
GFR, EST NON AFRICAN AMERICAN: 80 mL/min/{1.73_m2} (ref >59)
GLOBULIN, TOTAL: 2.6 g/dL (ref 1.5–4.5)
Glucose: 84 mg/dL (ref 65–99)
POTASSIUM: 4.4 mmol/L (ref 3.5–5.2)
Sodium: 139 mmol/L (ref 134–144)
TOTAL PROTEIN: 6.3 g/dL (ref 6.0–8.5)

## 2016-02-20 LAB — POCT URINALYSIS DIP (MANUAL ENTRY)
Bilirubin, UA: NEGATIVE
Glucose, UA: NEGATIVE
Ketones, POC UA: NEGATIVE
Nitrite, UA: NEGATIVE
PROTEIN UA: NEGATIVE
SPEC GRAV UA: 1.025
Urobilinogen, UA: 0.2
pH, UA: 6

## 2016-02-20 LAB — POC MICROSCOPIC URINALYSIS (UMFC)

## 2016-02-20 NOTE — Patient Instructions (Addendum)
Continue antibiotic treatment as prescribed. If you begin to notice any further blood in urine or when wiping return promptly for evaluation.    IF you received an x-ray today, you will receive an invoice from [facility]. Please contact [facility] at [phone] with questions or concerns regarding your invoice.   IF you received labwork today, you will receive an invoice from [place]. Please contact [facility] at [phone] with questions or concerns regarding your invoice.   Our billing staff will not be able to assist you with questions regarding bills from these companies.  You will be contacted with the lab results as soon as they are available. The fastest way to get your results is to activate your My Chart account. Instructions are located on the last page of this paperwork. If you have not heard from [place] regarding the results in 2 weeks, please contact this office.

## 2016-02-20 NOTE — Progress Notes (Signed)
Patient ID: Sheila Tucker, female    DOB: [birth year], 66 y.o.   MRN: [id number]  PCP: Delman Cheadle, MD  Chief Complaint  Patient presents with  . Follow-up    Urinary frequency    Subjective:  HPI 66 year old female presents for evaluation of urinary tract infection diagnosed on yesterday. She had a mildly elevated white count with symptoms consistent for pyelonephritis. She presents today for repeat CBC and urine recheck. Denies chills, abdominal pain, or fever. She is currently taking and tolerating her coarse of Ciprofloxacin.  Social History   Social History  . Marital status: Single    Spouse name: N/A  . Number of children: N/A  . Years of education: N/A   Occupational History  . Not on file.   Social History Main Topics  . Smoking status: Never Smoker  . Smokeless tobacco: Never Used     Comment: Patient never smoked, around second hand smoke  . Alcohol use No  . Drug use: No  . Sexual activity: Not on file   Other Topics Concern  . Not on file   Social History Narrative  . No narrative on file    Family History  Problem Relation Age of Onset  . Heart attack Father 80    Congential Valvular Heart disease  . Colon cancer Paternal Grandfather   . Colon cancer Maternal Grandfather   . Breast cancer Paternal Aunt   . Diabetes Paternal Aunt   . Colon cancer Paternal Uncle     X 2  . Stroke Neg Hx    Review of Systems See HPI Patient Active Problem List   Diagnosis Date Noted  . Lymphocytic colitis 03/25/2014  . Dyspnea 10/28/2013  . Colon polyps 07/09/2010  . Diverticulosis 07/09/2010  . Essential hypertension 01/27/2009  . HYPERLIPIDEMIA 02/20/2007  . DYSMETABOLIC SYNDROME X [id number]  . Asthma 02/20/2007  . GERD 02/20/2007    Allergies  Allergen Reactions  . Tetanus Toxoid     REACTION: FEVER AND SWELLING    Prior to Admission medications   Medication Sig Start Date End Date Taking? Authorizing Provider  albuterol (PROVENTIL HFA;VENTOLIN  HFA) 108 (90 Base) MCG/ACT inhaler Inhale 2 puffs into the lungs every 4 (four) hours as needed for wheezing or shortness of breath. 03/05/15  Yes Shawnee Knapp, MD  budesonide-formoterol [facility]) 160-4.5 MCG/ACT inhaler Take 2 puffs first thing in am and then another 2 puffs about 12 hours later. 01/28/15  Yes Elby Beck, FNP  ciprofloxacin (CIPRO) 500 MG tablet Take 1 tablet (500 mg total) by mouth 2 (two) times daily. 02/19/16 02/26/16 Yes Leonie Douglas, PA-C  fluticasone (FLONASE) 50 MCG/ACT nasal spray Place 2 sprays into both nostrils at bedtime. 12/22/15  Yes Shawnee Knapp, MD  lidocaine (LIDODERM) 5 % Place 1 patch onto the skin daily. Remove & Discard patch within 12 hours or as directed by MD 01/05/16  Yes Shawnee Knapp, MD    Past Medical, Surgical Family and Social History reviewed and updated.    Objective:   Today's Vitals   02/20/16 1210  BP: (!) 148/76  Pulse: 84  Resp: 16  Temp: 98.2 F (36.8 C)  TempSrc: Oral  SpO2: 96%  Weight: 296 lb (134.3 kg)  Height: 5\' 9"  (1.753 m)    Wt Readings from Last 3 Encounters:  02/20/16 296 lb (134.3 kg)  02/19/16 292 lb (132.5 kg)  12/22/15 288 lb (130.6 kg)    Physical Exam  Cardiovascular: Normal  rate.   Pulmonary/Chest: Effort normal.    Results for orders placed or performed in visit on 02/20/16  POCT Microscopic Urinalysis ([facility])  Result Value Ref Range   WBC,UR,HPF,POC Many (A) None WBC/hpf   RBC,UR,HPF,POC Few (A) None RBC/hpf   Bacteria Few (A) None, Too numerous to count   Mucus Present (A) Absent   Epithelial Cells, UR Per Microscopy Moderate (A) None, Too numerous to count cells/hpf  POCT urinalysis dipstick  Result Value Ref Range   Color, UA yellow yellow   Clarity, UA hazy (A) clear   Glucose, UA negative negative   Bilirubin, UA negative negative   Ketones, POC UA negative negative   Spec Grav, UA 1.025    Blood, UA trace-lysed (A) negative   pH, UA 6.0    Protein Ur, POC negative negative    Urobilinogen, UA 0.2    Nitrite, UA Negative Negative   Leukocytes, UA Trace (A) Negative  POCT CBC  Result Value Ref Range   WBC 6.9 4.6 - 10.2 K/uL   Lymph, poc 2.4 0.6 - 3.4   POC LYMPH PERCENT 34.7 10 - 50 %L   MID (cbc) 0.4 0 - 0.9   POC MID % 5.3 0 - 12 %M   POC Granulocyte 4.1 2 - 6.9   Granulocyte percent 60.0 37 - 80 %G   RBC 4.87 4.04 - 5.48 M/uL   Hemoglobin 14.5 12.2 - 16.2 g/dL   HCT, POC 42.1 37.7 - 47.9 %   MCV 86.5 80 - 97 fL   MCH, POC 29.9 27 - 31.2 pg   MCHC 34.5 31.8 - 35.4 g/dL   RDW, POC 14.6 %   Platelet Count, POC 262 142 - 424 K/uL   MPV 6.3 0 - 99.8 fL      Assessment & Plan:  1. Acute pyelonephritis-Resolving  Improving- stable Complete entire coarse of medication. Advised to return if symptoms re-develop.    Carroll Sage. Kenton Kingfisher, MSN, FNP-C [facility] [place]

## 2016-02-22 LAB — URINE CULTURE

## 2016-03-02 ENCOUNTER — Other Ambulatory Visit: Payer: Self-pay | Admitting: Family Medicine

## 2016-03-02 DIAGNOSIS — J4520 Mild intermittent asthma, uncomplicated: Secondary | ICD-10-CM

## 2016-03-03 ENCOUNTER — Telehealth: Payer: Self-pay | Admitting: Family Medicine

## 2016-03-03 ENCOUNTER — Other Ambulatory Visit: Payer: Self-pay | Admitting: Family Medicine

## 2016-03-03 DIAGNOSIS — J4520 Mild intermittent asthma, uncomplicated: Secondary | ICD-10-CM

## 2016-03-03 MED ORDER — BUDESONIDE-FORMOTEROL FUMARATE 160-4.5 MCG/ACT IN AERO: INHALATION_SPRAY | RESPIRATORY_TRACT | 2 refills | Status: DC

## 2016-03-03 NOTE — Telephone Encounter (Signed)
Pt advised rx sent in. 

## 2016-03-03 NOTE — Telephone Encounter (Signed)
Patient is calling to seek advise on her Symbicort inhaler.  She states that she had a physical with Brigitte Pulse back in September of last year and she was supposed to renew her Symbicort prescription for a year but when she went to pick up her prescription, the pharmacy said the prescription was denied.  Please advise  [phone]

## 2016-03-16 ENCOUNTER — Ambulatory Visit (INDEPENDENT_AMBULATORY_CARE_PROVIDER_SITE_OTHER): Payer: Medicare Other | Admitting: Family Medicine

## 2016-03-16 ENCOUNTER — Telehealth: Payer: Self-pay | Admitting: Internal Medicine

## 2016-03-16 VITALS — BP 134/82 | HR 109 | Temp 97.5°F | Resp 18 | Ht 69.0 in | Wt 294.2 lb

## 2016-03-16 DIAGNOSIS — J45901 Unspecified asthma with (acute) exacerbation: Secondary | ICD-10-CM | POA: Diagnosis not present

## 2016-03-16 DIAGNOSIS — B9789 Other viral agents as the cause of diseases classified elsewhere: Secondary | ICD-10-CM | POA: Diagnosis not present

## 2016-03-16 DIAGNOSIS — J069 Acute upper respiratory infection, unspecified: Secondary | ICD-10-CM

## 2016-03-16 DIAGNOSIS — R000 Tachycardia, unspecified: Secondary | ICD-10-CM

## 2016-03-16 MED ORDER — ALBUTEROL SULFATE (2.5 MG/3ML) 0.083% IN NEBU
2.5000 mg | INHALATION_SOLUTION | Freq: Once | RESPIRATORY_TRACT | Status: AC
  Administered 2016-03-16: 2.5 mg via RESPIRATORY_TRACT

## 2016-03-16 MED ORDER — IPRATROPIUM BROMIDE 0.02 % IN SOLN
0.5000 mg | Freq: Once | RESPIRATORY_TRACT | Status: AC
  Administered 2016-03-16: 0.5 mg via RESPIRATORY_TRACT

## 2016-03-16 MED ORDER — PREDNISONE 50 MG PO TABS: 50.0000 mg | ORAL_TABLET | Freq: Every day | ORAL | 0 refills | Status: AC

## 2016-03-16 NOTE — Patient Instructions (Addendum)
Upper Respiratory Infection, Adult Most upper respiratory infections (URIs) are caused by a virus. A URI affects the nose, throat, and upper air passages. The most common type of URI is often called "the common cold." Follow these instructions at home:  Take medicines only as told by your doctor.  Gargle warm saltwater or take cough drops to comfort your throat as told by your doctor.  Use a warm mist humidifier or inhale steam from a shower to increase air moisture. This may make it easier to breathe.  Drink enough fluid to keep your pee (urine) clear or pale yellow.  Eat soups and other clear broths.  Have a healthy diet.  Rest as needed.  Go back to work when your fever is gone or your doctor says it is okay.  You may need to stay home longer to avoid giving your URI to others.  You can also wear a face mask and wash your hands often to prevent spread of the virus.  Use your inhaler more if you have asthma.  Do not use any tobacco products, including cigarettes, chewing tobacco, or electronic cigarettes. If you need help quitting, ask your doctor. Contact a doctor if:  You are getting worse, not better.  Your symptoms are not helped by medicine.  You have chills.  You are getting more short of breath.  You have brown or red mucus.  You have yellow or brown discharge from your nose.  You have pain in your face, especially when you bend forward.  You have a fever.  You have puffy (swollen) neck glands.  You have pain while swallowing.  You have white areas in the back of your throat. Get help right away if:  You have very bad or constant:  Headache.  Ear pain.  Pain in your forehead, behind your eyes, and over your cheekbones (sinus pain).  Chest pain.  You have long-lasting (chronic) lung disease and any of the following:  Wheezing.  Long-lasting cough.  Coughing up blood.  A change in your usual mucus.  You have a stiff neck.  You have  changes in your:  Vision.  Hearing.  Thinking.  Mood. This information is not intended to replace advice given to you by your health care provider. Make sure you discuss any questions you have with your health care provider. Document Released: 06/08/2007 Document Revised: 08/23/2015 Document Reviewed: 03/27/2013 Elsevier Interactive Patient Education  2017 [employer].     IF you received an x-ray today, you will receive an invoice from [facility]. Please contact [facility] at [phone] with questions or concerns regarding your invoice.   IF you received labwork today, you will receive an invoice from [place]. Please contact [facility] at [phone] with questions or concerns regarding your invoice.   Our billing staff will not be able to assist you with questions regarding bills from these companies.  You will be contacted with the lab results as soon as they are available. The fastest way to get your results is to activate your My Chart account. Instructions are located on the last page of this paperwork. If you have not heard from [place] regarding the results in 2 weeks, please contact this office.

## 2016-03-16 NOTE — Progress Notes (Signed)
Sheila Tucker is a 66 y.o. female who presents to Primary Care at [facility] today for:  1.  URI.  Complaining of URI symptoms that started on Friday. First started with burning in airways. Then progressed to wet cough. Had a fever yesterday and day before. Tmax 101F. Last night woke up with sweats. History of pneumonia and asthma. Using rescue inhaler daily. Having headaches. No congestion or rhinorrhea. Endorses wheezing and cough. Mucous is normal color. Worse at night when trying to sleep.  ROS as above.  Pertinently, no chest pain, palpitations, Chills, Abd pain, N/V/D.   PMH reviewed. Patient is a nonsmoker. Passive smoke exposure. Past Medical History:  Diagnosis Date  . Allergy   . Asthma   . Cancer ([place])    uterine  . Cataract   . Diverticulosis   . GERD (gastroesophageal reflux disease)   . Hyperlipidemia    patient denies high lipids  . Hypertension    past, under control, no meds  . Lymphocytic colitis   . Tubular adenoma of colon    Past Surgical History:  Procedure Laterality Date  . ABDOMINAL HYSTERECTOMY  2003   & BSO for uterine cancer  . APPENDECTOMY    . COLONOSCOPY     tics and polyps (tubular adenomas), Neg in 2008  . SHOULDER SURGERY  2004/2006   both shoulders, Dr Alphonzo Cruise  . TONSILLECTOMY    . UPPER GASTROINTESTINAL ENDOSCOPY  2003   esophagitis, gastritis, duodenitis    Medications reviewed. Current Outpatient Prescriptions  Medication Sig Dispense Refill  . albuterol (PROVENTIL HFA;VENTOLIN HFA) 108 (90 Base) MCG/ACT inhaler Inhale 2 puffs into the lungs every 4 (four) hours as needed for wheezing or shortness of breath. 18 g 11  . budesonide-formoterol (SYMBICORT) 160-4.5 MCG/ACT inhaler Take 2 puffs first thing in am and then another 2 puffs about 12 hours later. 1 Inhaler 2  . fluticasone (FLONASE) 50 MCG/ACT nasal spray Place 2 sprays into both nostrils at bedtime. 16 g 11  . lidocaine (LIDODERM) 5 % Place 1 patch onto the skin daily. Remove &  Discard patch within 12 hours or as directed by MD 30 patch 5   No current facility-administered medications for this visit.      Physical Exam:  BP 134/82 (BP Location: Right Arm, Patient Position: Sitting, Cuff Size: Small)   Pulse (!) 109   Temp 97.5 F (36.4 C) (Oral)   Resp 18   Ht 5\' 9"  (1.753 m)   Wt 294 lb 3.2 oz (133.4 kg)   SpO2 93%   BMI 43.45 kg/m  Gen:  Alert, cooperative patient who appears stated age in no acute distress. Obese. Vital signs reviewed. HEENT: EOMI,  MMM, o/p clear. Pulm:  Diffuse scattered wheezing appreciated. No rales. Normal work of breathing.   Cardiac:  Tachycardia, regular rhythm without murmur auscultated.  Good S1/S2. Abd:  Soft/nondistended/nontender.  Good bowel sounds throughout all four quadrants.  No masses noted.    Assessment and Plan: 1. Viral upper respiratory tract infection Symptoms consistent with viral URI. Patient is well-appearing and afebrile. Conservative measures discussed with patient. No indication for chest xray at this time. Handout given.   2. Asthma with acute exacerbation, unspecified asthma severity, unspecified whether persistent Asthma exacerbated by above URI. Patient given breathing treatment today. Encouraged her to continue using her inhalers as indicated. Will give a short steroid burst as well. Patient not in respiratory distress and saturating normal on room air. Return precautions discussed.  - ipratropium (  ATROVENT) nebulizer solution 0.5 mg; Take 2.5 mLs (0.5 mg total) by nebulization once. - albuterol (PROVENTIL) (2.5 MG/3ML) 0.083% nebulizer solution 2.5 mg; Take 3 mLs (2.5 mg total) by nebulization once.  3. Tachycardia Most likely secondary to breathing treatments with albuterol she has been taking for her dyspnea and wheezing.   Luiz Blare, DO 03/16/2016, 8:51 AM PGY-3, [place]

## 2016-03-16 NOTE — Telephone Encounter (Signed)
All PPIs have a risk of exacerbating microscopic colitis. Pepcid or famotidine is probably the best GERD/reflux med to try as this is usually not associated with microscopic colitis. I would recommend she try famotidine 20 mg twice daily (every 12 hours) to see if this is sufficient to control reflux She should notify me if not

## 2016-03-16 NOTE — Telephone Encounter (Signed)
Pts husband states the pt has been having a lot of issues with acid reflux. Reports she has been eating tums but they do not help. States in the past before pt was diagnosed with colitis she took aciphex and it seemed to help her. They want to know if she could try taking aciphex again to see if this helps, want to make sure it will not cause issues with her colitis. Please advise.

## 2016-03-17 ENCOUNTER — Other Ambulatory Visit: Payer: Self-pay

## 2016-03-17 MED ORDER — FAMOTIDINE 20 MG PO TABS: 20.0000 mg | ORAL_TABLET | Freq: Two times a day (BID) | ORAL | 3 refills | Status: DC

## 2016-03-17 NOTE — Telephone Encounter (Signed)
Spoke with the patient and advised her. She agrees to take Pepcid 20 mg BID and to let us know if she fails to improve or she acutely worsens. Rx to [facility]

## 2016-03-21 ENCOUNTER — Telehealth: Payer: Self-pay | Admitting: Family Medicine

## 2016-03-21 NOTE — Telephone Encounter (Signed)
Pt states that she has finished all her Prednisone meds and it not feeling better.  Please advise.  Pt phone number is [phone]

## 2016-03-23 ENCOUNTER — Ambulatory Visit (INDEPENDENT_AMBULATORY_CARE_PROVIDER_SITE_OTHER): Payer: Medicare Other | Admitting: Family Medicine

## 2016-03-23 ENCOUNTER — Ambulatory Visit (INDEPENDENT_AMBULATORY_CARE_PROVIDER_SITE_OTHER): Payer: Medicare Other

## 2016-03-23 VITALS — BP 140/84 | HR 93 | Temp 97.6°F | Resp 18 | Ht 69.0 in | Wt 297.0 lb

## 2016-03-23 DIAGNOSIS — R0602 Shortness of breath: Secondary | ICD-10-CM

## 2016-03-23 DIAGNOSIS — J209 Acute bronchitis, unspecified: Secondary | ICD-10-CM | POA: Diagnosis not present

## 2016-03-23 MED ORDER — IPRATROPIUM BROMIDE 0.02 % IN SOLN
0.5000 mg | Freq: Once | RESPIRATORY_TRACT | Status: AC
  Administered 2016-03-23: 0.5 mg via RESPIRATORY_TRACT

## 2016-03-23 MED ORDER — PREDNISONE 20 MG PO TABS: 40.0000 mg | ORAL_TABLET | Freq: Every day | ORAL | 0 refills | Status: AC

## 2016-03-23 MED ORDER — ALBUTEROL SULFATE (2.5 MG/3ML) 0.083% IN NEBU
2.5000 mg | INHALATION_SOLUTION | Freq: Once | RESPIRATORY_TRACT | Status: AC
  Administered 2016-03-23: 2.5 mg via RESPIRATORY_TRACT

## 2016-03-23 MED ORDER — LEVOFLOXACIN 500 MG PO TABS: 500.0000 mg | ORAL_TABLET | Freq: Every day | ORAL | 0 refills | Status: DC

## 2016-03-23 NOTE — Patient Instructions (Addendum)
  IF you received an x-ray today, you will receive an invoice from [facility]. Please contact [facility]iology at [phone] with questions or concerns regarding your invoice.   IF you received labwork today, you will receive an invoice from [place]. Please contact [facility] at [phone] with questions or concerns regarding your invoice.   Our billing staff will not be able to assist you with questions regarding bills from these companies.  You will be contacted with the lab results as soon as they are available. The fastest way to get your results is to activate your My Chart account. Instructions are located on the last page of this paperwork. If you have not heard from [place] regarding the results in 2 weeks, please contact this office.     No signs of pneumonia on chest imaging.  Medications sent to pharmacy   Acute Bronchitis, Adult Acute bronchitis is when air tubes (bronchi) in the lungs suddenly get swollen. The condition can make it hard to breathe. It can also cause these symptoms:  A cough.  Coughing up clear, yellow, or green mucus.  Wheezing.  Chest congestion.  Shortness of breath.  A fever.  Body aches.  Chills.  A sore throat. Follow these instructions at home: Medicines   Take over-the-counter and prescription medicines only as told by your doctor.  If you were prescribed an antibiotic medicine, take it as told by your doctor. Do not stop taking the antibiotic even if you start to feel better. General instructions   Rest.  Drink enough fluids to keep your pee (urine) clear or pale yellow.  Avoid smoking and secondhand smoke. If you smoke and you need help quitting, ask your doctor. Quitting will help your lungs heal faster.  Use an inhaler, cool mist vaporizer, or humidifier as told by your doctor.  Keep all follow-up visits as told by your doctor. This is important. How is this prevented? To lower your risk of getting this condition  again:  Wash your hands often with soap and water. If you cannot use soap and water, use hand sanitizer.  Avoid contact with people who have cold symptoms.  Try not to touch your hands to your mouth, nose, or eyes.  Make sure to get the flu shot every year. Contact a doctor if:  Your symptoms do not get better in 2 weeks. Get help right away if:  You cough up blood.  You have chest pain.  You have very bad shortness of breath.  You become dehydrated.  You faint (pass out) or keep feeling like you are going to pass out.  You keep throwing up (vomiting).  You have a very bad headache.  Your fever or chills gets worse. This information is not intended to replace advice given to you by your health care provider. Make sure you discuss any questions you have with your health care provider. Document Released: 06/08/2007 Document Revised: 07/29/2015 Document Reviewed: 06/10/2015 Elsevier Interactive Patient Education  2017 [employer].

## 2016-03-23 NOTE — Telephone Encounter (Signed)
Yes, because she might need another breathing treatment here or change of therapy.  Thanks, JW

## 2016-03-23 NOTE — Progress Notes (Signed)
Sheila Tucker is a 66 y.o. female who presents to Primary Care at [facility] today for:  1.  Shortness of breath.  Patient recently seen last week for viral URI symptoms causing worsening of her asthma. Patient now states that symptoms have not worsened but neither have they improved. She completed steroid burst without feeling improved. Now complaining of congestion and rhonchorus noises. She is having continued dyspnea and having to use her inhalers more frequently. Persistent wet cough that is causing muscle pain in chest and upper abdomen. Fevers have subsided.   ROS as above.  Pertinently, no chest pain, palpitations, Fever, Chills, Abd pain, N/V/D.   PMH reviewed. Patient is a nonsmoker.   Past Medical History:  Diagnosis Date  . Allergy   . Asthma   . Cancer ([place])    uterine  . Cataract   . Diverticulosis   . GERD (gastroesophageal reflux disease)   . Hyperlipidemia    patient denies high lipids  . Hypertension    past, under control, no meds  . Lymphocytic colitis   . Tubular adenoma of colon    Past Surgical History:  Procedure Laterality Date  . ABDOMINAL HYSTERECTOMY  2003   & BSO for uterine cancer  . APPENDECTOMY    . COLONOSCOPY     tics and polyps (tubular adenomas), Neg in 2008  . SHOULDER SURGERY  2004/2006   both shoulders, Dr Alphonzo Cruise  . TONSILLECTOMY    . UPPER GASTROINTESTINAL ENDOSCOPY  2003   esophagitis, gastritis, duodenitis    Medications reviewed. Current Outpatient Prescriptions  Medication Sig Dispense Refill  . albuterol (PROVENTIL HFA;VENTOLIN HFA) 108 (90 Base) MCG/ACT inhaler Inhale 2 puffs into the lungs every 4 (four) hours as needed for wheezing or shortness of breath. 18 g 11  . budesonide-formoterol (SYMBICORT) 160-4.5 MCG/ACT inhaler Take 2 puffs first thing in am and then another 2 puffs about 12 hours later. 1 Inhaler 2  . famotidine (PEPCID) 20 MG tablet Take 1 tablet (20 mg total) by mouth 2 (two) times daily. 60 tablet 3  .  fluticasone (FLONASE) 50 MCG/ACT nasal spray Place 2 sprays into both nostrils at bedtime. 16 g 11  . lidocaine (LIDODERM) 5 % Place 1 patch onto the skin daily. Remove & Discard patch within 12 hours or as directed by MD 30 patch 5   No current facility-administered medications for this visit.      Physical Exam:  BP 140/84 (BP Location: Right Arm, Patient Position: Sitting, Cuff Size: Large)   Pulse 93   Temp 97.6 F (36.4 C) (Oral)   Resp 18   Ht 5\' 9"  (1.753 m)   Wt 297 lb (134.7 kg)   SpO2 95%   BMI 43.86 kg/m  Gen:  Alert, cooperative patient who appears stated age in no acute distress.  Vital signs reviewed. HEENT: EOMI,  MMM, o/p clear. Pulm:  Normal work of breathing, diffuse rhonchi throughout, no wheezing. Good air movement. Cough with deep inspirations. Cardiac:  Regular rate and rhythm without murmur auscultated.  Good S1/S2. Abd:  Soft/nondistended/nontender.  Good bowel sounds throughout all four quadrants.  No masses noted.   Dg Chest 2 View  Result Date: 03/23/2016 CLINICAL DATA:  Short of breath. Wheezing. Symptoms for 1 week. History of asthma. EXAM: CHEST  2 VIEW COMPARISON:  10/28/2013 FINDINGS: The heart is borderline enlarged. Normal vascularity. Clear lungs. No pneumothorax or pleural effusion. Spondylitic changes in the spine are noted. Round density projecting over the upper thoracic  spine is stable compared to the prior study. This supports benign etiology. It is likely a bony abnormality. IMPRESSION: No active cardiopulmonary disease. Electronically Signed   By: Marybelle Killings M.D.   On: 03/23/2016 14:19     Assessment and Plan:  1. Acute bronchitis, unspecified organism Symptoms consistent with an acute bronchitis. Most likely exacerbated by recent URI. Patient susceptible as well with poorly controlled asthma. Will treat with course of Levaquin and send in another Rx for steroids. Patient to continue all breathing treatments. Chest imaging today without  signs of pneumonia or pulmonary edema; unremarkable. Vitals are stable and patient with normal work of breathing. Return precautions given.  2. Shortness of breath Due to above. Breathing treatment given again in clinic. Oxygen saturation normal.  - DG Chest 2 View; Future - albuterol (PROVENTIL) (2.5 MG/3ML) 0.083% nebulizer solution 2.5 mg; Take 3 mLs (2.5 mg total) by nebulization once. - ipratropium (ATROVENT) nebulizer solution 0.5 mg; Take 2.5 mLs (0.5 mg total) by nebulization once.  Luiz Blare, DO 03/23/2016, 4:24 PM PGY-3, [place]

## 2016-03-23 NOTE — Telephone Encounter (Signed)
Pt advised and tx up front for appt.

## 2016-03-23 NOTE — Telephone Encounter (Signed)
Needs ov , correct?

## 2016-06-08 ENCOUNTER — Encounter: Payer: Self-pay | Admitting: Family Medicine

## 2016-06-08 ENCOUNTER — Ambulatory Visit (INDEPENDENT_AMBULATORY_CARE_PROVIDER_SITE_OTHER): Payer: Medicare Other | Admitting: Family Medicine

## 2016-06-08 VITALS — BP 152/92 | HR 116 | Temp 99.5°F | Resp 17 | Ht 67.0 in | Wt 303.0 lb

## 2016-06-08 DIAGNOSIS — N3090 Cystitis, unspecified without hematuria: Secondary | ICD-10-CM

## 2016-06-08 DIAGNOSIS — R300 Dysuria: Secondary | ICD-10-CM | POA: Diagnosis not present

## 2016-06-08 LAB — POC URINALSYSI DIPSTICK (AUTOMATED)
Glucose, UA: NEGATIVE
KETONES UA: 15
Nitrite, UA: POSITIVE
Protein, UA: 100
Spec Grav, UA: >=1.03 — AB (ref 1.010–1.025)
Urobilinogen, UA: 1 E.U./dL
pH, UA: 5.5 (ref 5.0–8.0)

## 2016-06-08 LAB — POCT UA - MICROSCOPIC ONLY: Mucus, UA: ABSENT

## 2016-06-08 MED ORDER — CEPHALEXIN 500 MG PO CAPS: 500.0000 mg | ORAL_CAPSULE | Freq: Two times a day (BID) | ORAL | 0 refills | Status: DC

## 2016-06-08 NOTE — Patient Instructions (Signed)
     IF you received an x-ray today, you will receive an invoice from [facility]. Please contact [facility] at [phone] with questions or concerns regarding your invoice.   IF you received labwork today, you will receive an invoice from [facility]. Please contact [facility] at [phone] with questions or concerns regarding your invoice.   Our billing staff will not be able to assist you with questions regarding bills from these companies.  You will be contacted with the lab results as soon as they are available. The fastest way to get your results is to activate your My Chart account. Instructions are located on the last page of this paperwork. If you have not heard from us regarding the results in 2 weeks, please contact this office.     

## 2016-06-08 NOTE — Progress Notes (Signed)
Subjective:  By signing my name below, I, Sheila Tucker, attest that this documentation has been prepared under the direction and in the presence of Delman Cheadle, MD Electronically Signed: Ladene Artist, ED Scribe 06/08/2016 at 2:46 PM.   Patient ID: Sheila Tucker, female    DOB: 1950/02/25, 66 y.o.   MRN: [id number] Chief Complaint  Patient presents with  . Dysuria   HPI Sheila Tucker is a 66 y.o. female who presents to Primary Care at [facility] complaining of dysuria onset 3-4 days ago. Pt reports associated symptoms of urinary frequency but only with standing and incomplete emptying. She denies fever, chills, fatigue, flank pain, hematuria, flank pain, myalgias, constipation, diarrhea, nausea, vomiting. Pt was on Levaquin in February and Cipro 500 for 1 week, E.coli resisted to some cephalosporins but otherwise sensitive to Keflex. No antibiotic allergies.   Past Medical History:  Diagnosis Date  . Allergy   . Asthma   . Cancer ([place])    uterine  . Cataract   . Diverticulosis   . GERD (gastroesophageal reflux disease)   . Hyperlipidemia    patient denies high lipids  . Hypertension    past, under control, no meds  . Lymphocytic colitis   . Tubular adenoma of colon    Current Outpatient Prescriptions on File Prior to Visit  Medication Sig Dispense Refill  . albuterol (PROVENTIL HFA;VENTOLIN HFA) 108 (90 Base) MCG/ACT inhaler Inhale 2 puffs into the lungs every 4 (four) hours as needed for wheezing or shortness of breath. 18 g 11  . budesonide-formoterol (SYMBICORT) 160-4.5 MCG/ACT inhaler Take 2 puffs first thing in am and then another 2 puffs about 12 hours later. 1 Inhaler 2  . famotidine (PEPCID) 20 MG tablet Take 1 tablet (20 mg total) by mouth 2 (two) times daily. 60 tablet 3  . fluticasone (FLONASE) 50 MCG/ACT nasal spray Place 2 sprays into both nostrils at bedtime. 16 g 11  . lidocaine (LIDODERM) 5 % Place 1 patch onto the skin daily. Remove & Discard patch within 12 hours or as  directed by MD 30 patch 5  . levofloxacin (LEVAQUIN) 500 MG tablet Take 1 tablet (500 mg total) by mouth daily. (Patient not taking: Reported on 06/08/2016) 7 tablet 0   No current facility-administered medications on file prior to visit.    Allergies  Allergen Reactions  . Tetanus Toxoid     REACTION: FEVER AND SWELLING   Review of Systems  Constitutional: Negative for chills, fatigue and fever.  Gastrointestinal: Negative for constipation, diarrhea, nausea and vomiting.  Genitourinary: Positive for dysuria and frequency. Negative for flank pain and hematuria.  Musculoskeletal: Negative for myalgias.      Objective:   Physical Exam  Constitutional: She is oriented to person, place, and time. She appears well-developed and well-nourished. No distress.  HENT:  Head: Normocephalic and atraumatic.  Eyes: Conjunctivae and EOM are normal.  Neck: Neck supple. No tracheal deviation present.  Cardiovascular: Normal rate, regular rhythm, S1 normal, S2 normal and normal heart sounds.   Pulmonary/Chest: Effort normal and breath sounds normal. No respiratory distress.  Abdominal: Soft. Bowel sounds are normal.  Musculoskeletal: Normal range of motion.  Neurological: She is alert and oriented to person, place, and time.  Skin: Skin is warm and dry.  Psychiatric: She has a normal mood and affect. Her behavior is normal.  Nursing note and vitals reviewed.  BP (!) 152/92   Pulse (!) 116   Temp 99.5 F (37.5 C) (Oral)   Resp  17   Ht 5\' 7"  (1.702 m)   Wt (!) 303 lb (137.4 kg)   SpO2 98%   BMI 47.46 kg/m    Results for orders placed or performed in visit on 06/08/16  POCT UA - Microscopic Only  Result Value Ref Range   WBC, Ur, HPF, POC too numerous to count    RBC, urine, microscopic too numerous to count    Bacteria, U Microscopic many    Mucus, UA absent    Epithelial cells, urine per micros few    Crystals, Ur, HPF, POC     Casts, Ur, LPF, POC     Yeast, UA    POCT Urinalysis  Dipstick (Automated)  Result Value Ref Range   Color, UA yellow    Clarity, UA cloudy    Glucose, UA negative    Bilirubin, UA small    Ketones, UA 15    Spec Grav, UA >=1.030 (A) 1.010 - 1.025   Blood, UA large    pH, UA 5.5 5.0 - 8.0   Protein, UA 100    Urobilinogen, UA 1.0 0.2 or 1.0 E.U./dL   Nitrite, UA positive    Leukocytes, UA Small (1+) (A) Negative      Assessment & Plan:   1. Dysuria   2. Bladder infection - has had prior but not consistent so no need for prophylactics - last was 4 mos prior, then 6 mos prior to that but can go for yrs w/o infxns. Has been on fluoroquinolones x 2 recently so will use Keflex which last UTI (E.coli) was sensitive to. However, will send clx as did have keflex resistant Enterobactor yrs prior.  Use longer course since prior UTI was relatively recent.  Denies prob w/ yeast infections - ok to call in fluconazole x 1 if does have sxs.  Orders Placed This Encounter  Procedures  . Urine culture  . POCT UA - Microscopic Only  . POCT Urinalysis Dipstick (Automated)    Meds ordered this encounter  Medications  . cephALEXin (KEFLEX) 500 MG capsule    Sig: Take 1 capsule (500 mg total) by mouth 2 (two) times daily.    Dispense:  20 capsule    Refill:  0    I personally performed the services described in this documentation, which was scribed in my presence. The recorded information has been reviewed and considered, and addended by me as needed.   Delman Cheadle, M.D.  Primary Care at [facility] [address] [place], [place] [address] [phone] phone [phone] fax  06/09/16 2:42 AM

## 2016-06-12 LAB — URINE CULTURE

## 2016-07-17 ENCOUNTER — Other Ambulatory Visit: Payer: Self-pay | Admitting: Family Medicine

## 2016-07-17 DIAGNOSIS — J4520 Mild intermittent asthma, uncomplicated: Secondary | ICD-10-CM

## 2016-07-18 ENCOUNTER — Other Ambulatory Visit: Payer: Self-pay | Admitting: Family Medicine

## 2016-07-19 NOTE — Telephone Encounter (Signed)
PATIENT WOULD LIKE DR. SHAW TO KNOW THAT SHE NEEDS A REFILL ON HER VENTOLIN HFA INHALER. HER INSURANCE ([employer]) TOLD HER THAT HER PCP WOULD HAVE TO DO A PRIOR AUTHORIZATION AND THAT THEY HAVE FAXED OVER THE NECESSARY FORMS. THEY GAVE HER A PHONE NUMBER TO CALL [employer] OF [phone]. THEY GAVE HER A PRIOR AUTH. NUMBER OF [id number]. PLEASE CALL TO LET HER KNOW WHEN IT HAS BEEN DONE. BEST PHONE [phone] (CELL) [place]

## 2016-07-20 ENCOUNTER — Other Ambulatory Visit: Payer: Self-pay | Admitting: Family Medicine

## 2016-07-20 ENCOUNTER — Telehealth: Payer: Self-pay

## 2016-07-20 NOTE — Telephone Encounter (Signed)
It is completely fine to switch this to whatever albuterol is preferred  By  Her insurance (proair (no respiclick cause of the powder) or proventil are the other options - exact same sig and quantity and refills can be used).  I don't want this to wait until I get back from vacation as she might need her albuterol rx more emergently. If we don't know what is preferred, just submit it so it gets rejected and we are told.   I am assuming it does not say anything in her chart about her needing ventolin for some reason above the others. . . .

## 2016-07-20 NOTE — Telephone Encounter (Signed)
PA Form for Ventolin HFA placed in box. See highlighted item Thanks!

## 2016-07-21 NOTE — Telephone Encounter (Signed)
PRIOR AUTH WAS APPROVED UNTIL 01/02/2017   PATIENT WAS INFORMED

## 2016-07-21 NOTE — Telephone Encounter (Signed)
Sheila Tucker got Ventolin approved till end of the year.  Pt advised

## 2016-07-27 ENCOUNTER — Telehealth: Payer: Self-pay | Admitting: Internal Medicine

## 2016-07-27 NOTE — Telephone Encounter (Signed)
Left message for patient to call back  

## 2016-07-27 NOTE — Telephone Encounter (Signed)
Patient with worsening reflux.  Her pepcid is not working.  She will come in and see Nicoletta Ba PA on 08/01/2016

## 2016-08-01 ENCOUNTER — Ambulatory Visit (INDEPENDENT_AMBULATORY_CARE_PROVIDER_SITE_OTHER): Payer: Medicare Other | Admitting: Physician Assistant

## 2016-08-01 ENCOUNTER — Encounter: Payer: Self-pay | Admitting: Physician Assistant

## 2016-08-01 VITALS — BP 122/80 | HR 95 | Ht 67.0 in | Wt 307.0 lb

## 2016-08-01 DIAGNOSIS — Z86010 Personal history of colonic polyps: Secondary | ICD-10-CM | POA: Diagnosis not present

## 2016-08-01 DIAGNOSIS — K219 Gastro-esophageal reflux disease without esophagitis: Secondary | ICD-10-CM | POA: Diagnosis not present

## 2016-08-01 DIAGNOSIS — K52832 Lymphocytic colitis: Secondary | ICD-10-CM | POA: Diagnosis not present

## 2016-08-01 MED ORDER — RANITIDINE HCL 300 MG PO TABS: ORAL_TABLET | ORAL | 2 refills | Status: DC

## 2016-08-01 NOTE — Patient Instructions (Addendum)
We have sent the following medications to your pharmacy for you to pick up at your convenience: Chaplin. You may use tumes or Mylanta 30 min before meals.   We have provided you with a anti- reflux handout.

## 2016-08-01 NOTE — Progress Notes (Addendum)
Subjective:    Patient ID: Sheila Tucker, female    DOB: 18-Feb-1950, 66 y.o.   MRN: [id number]  HPI Quetzally is a pleasant 66 year old white female known to Dr. Hilarie Fredrickson. She has history of hypertension, asthma, morbid obesity, chronic GERD, lymphocytic colitis, hyperlipidemia and diverticulosis. She comes in today with complaints of poorly controlled reflux. She has been taking Pepcid 20 mg by mouth twice a day over the past several months for GERD symptoms but says she does not feel like it helped at all. She has been off of medicine over the past month. Because of history of severe lymphocytic colitis Dr. Hilarie Fredrickson was trying to avoid PPI therapy. Patient last had EGD in 2003 with findings of a 2 cm hiatal hernia moderate esophagitis and was empirically Maloney dilated. Colonoscopy in January 2016 done for follow-up of adenomatous polyps with finding of 2 sessile polyps and moderate diverticulosis. Random biopsies were positive for lymphocytic colitis in tubular adenomatous polyps. Patient states that she was very sick with the lymphocytic colitis and that it took several months to get her symptoms under control after she was diagnosed. She does not want to take any medication that will possibly trigger the colitis despite knowing that AcipHex worked well for her in the past. Exline She says her symptoms are primarily occurring during the day usually start around lunchtime after she is a few bites of lunch and then flare up again later in the afternoon. She has been following an antireflux diet. She denies any dysphagia or odynophagia. Primary complaint is sour brash heartburn and indigestion. She has no complaints of abdominal pain.  Review of Systems Pertinent positive and negative review of systems were noted in the above HPI section.  All other review of systems was otherwise negative.  Outpatient Encounter Prescriptions as of 08/01/2016  Medication Sig  . fluticasone (FLONASE) 50 MCG/ACT nasal spray  Place 2 sprays into both nostrils at bedtime.  . lidocaine (LIDODERM) 5 % Place 1 patch onto the skin daily. Remove & Discard patch within 12 hours or as directed by MD  . Dellis Anes 160-4.5 MCG/ACT inhaler INHALE 2 PUFFS BY MOUTH IN THE MORNING, AND THEN ANOTHER 2 PUFFS ABOUT 12 HOURS LATER  . VENTOLIN HFA 108 (90 Base) MCG/ACT inhaler INHALE 2 PUFFS INTO THE LUNGS EVERY 4 HOURS AS NEEDED FOR WHEEZING OR SHORTNESS OF BREATH  . ranitidine (ZANTAC) 300 MG tablet Take 1 tablet before breakfast and dinner.  . [DISCONTINUED] cephALEXin (KEFLEX) 500 MG capsule Take 1 capsule (500 mg total) by mouth 2 (two) times daily.  . [DISCONTINUED] famotidine (PEPCID) 20 MG tablet Take 1 tablet (20 mg total) by mouth 2 (two) times daily.   No facility-administered encounter medications on file as of 08/01/2016.    Allergies  Allergen Reactions  . Tetanus Toxoid     REACTION: FEVER AND SWELLING   Patient Active Problem List   Diagnosis Date Noted  . Lymphocytic colitis 03/25/2014  . Dyspnea 10/28/2013  . Colon polyps 07/09/2010  . Diverticulosis 07/09/2010  . Essential hypertension 01/27/2009  . HYPERLIPIDEMIA 02/20/2007  . DYSMETABOLIC SYNDROME X 49/44/9675  . Asthma 02/20/2007  . GERD 02/20/2007   Social History   Social History  . Marital status: Married    Spouse name: terry  . Number of children: N/A  . Years of education: N/A   Occupational History  . Not on file.   Social History Main Topics  . Smoking status: Never Smoker  . Smokeless tobacco: Never Used  Comment: Patient never smoked, around second hand smoke  . Alcohol use No  . Drug use: No  . Sexual activity: No   Other Topics Concern  . Not on file   Social History Narrative  . No narrative on file    Ms. Badia's family history includes Breast cancer in her paternal aunt; Colon cancer in her maternal grandfather, paternal grandfather, and paternal uncle; Diabetes in her paternal aunt; Heart attack (age of onset: 72)  in her father.      Objective:    Vitals:   08/01/16 0852  BP: 122/80  Pulse: 95    Physical Exam well-developed older white female in no acute distress, pleasant blood pressure 122/80 pulse 95 height 5 foot 7, weight 307, BMI 48. HEENT nontraumatic normocephalic EOMI PERRLA sclera anicteric, not further examined today      Assessment & Plan:   #80 66 year old white female with poorly controlled chronic GERD on low-dose twice a day famotidine #2 history of lymphocytic colitis currently in remission #3 history of adenomatous colon polyps-up-to-date with colonoscopy due for follow-up January 2021 #4 diverticulosis #5 hypertension #6 morbid obesity  Plan; strict anti-reflux regimen. Offered low-dose PPI therapy which she is very reluctant to start at this time. We'll give her a trial of ranitidine 300 mg by mouth twice a day, she may use Tums or Mylanta on a when necessary basis. Have asked her to follow up with Dr. Hilarie Fredrickson or myself in 6-8 weeks. If symptoms are not any better controlled will need to consider other options i.e. low-dose PPI with low-dose Entocort.  Amy S Esterwood PA-C 08/01/2016   Cc: Shawnee Knapp, MD  Addendum: Reviewed and agree with initial management. Pyrtle, Lajuan Lines, MD

## 2016-09-20 ENCOUNTER — Ambulatory Visit: Payer: Medicare Other | Admitting: Internal Medicine

## 2016-10-13 ENCOUNTER — Ambulatory Visit (INDEPENDENT_AMBULATORY_CARE_PROVIDER_SITE_OTHER): Payer: Medicare Other

## 2016-10-13 VITALS — BP 132/82 | HR 99 | Ht 67.0 in | Wt 302.0 lb

## 2016-10-13 DIAGNOSIS — Z0000 Encounter for general adult medical examination without abnormal findings: Secondary | ICD-10-CM | POA: Diagnosis not present

## 2016-10-13 NOTE — Progress Notes (Signed)
Subjective:   Paysley Poplar is a 66 y.o. female who presents for Medicare Annual (Subsequent) preventive examination.  Review of Systems:  N/A Cardiac Risk Factors include: advanced age (>15men, >51 women);hypertension;dyslipidemia;obesity (BMI >30kg/m2)     Objective:     Vitals: BP 132/82   Pulse 99   Ht 5\' 7"  (1.702 m)   Wt (!) 302 lb (137 kg)   SpO2 96%   BMI 47.30 kg/m   Body mass index is 47.3 kg/m.   Tobacco History  Smoking Status  . Never Smoker  Smokeless Tobacco  . Never Used    Comment: Patient never smoked, around second hand smoke     Counseling given: Not Answered   Past Medical History:  Diagnosis Date  . Allergy   . Asthma   . Cancer ([place])    uterine  . Cataract   . Diverticulosis   . GERD (gastroesophageal reflux disease)   . Hyperlipidemia    patient denies high lipids  . Hypertension    past, under control, no meds  . Lymphocytic colitis   . Tubular adenoma of colon    Past Surgical History:  Procedure Laterality Date  . ABDOMINAL HYSTERECTOMY  2003   & BSO for uterine cancer  . APPENDECTOMY    . COLONOSCOPY     tics and polyps (tubular adenomas), Neg in 2008  . SHOULDER SURGERY  2004/2006   both shoulders, Dr Alphonzo Cruise  . TONSILLECTOMY    . UPPER GASTROINTESTINAL ENDOSCOPY  2003   esophagitis, gastritis, duodenitis   Family History  Problem Relation Age of Onset  . Heart attack Father 22       Congential Valvular Heart disease  . Colon cancer Paternal Grandfather   . Colon cancer Maternal Grandfather   . Breast cancer Paternal Aunt   . Diabetes Paternal Aunt   . Colon cancer Paternal Uncle        X 2  . Stroke Neg Hx   . Stomach cancer Neg Hx    History  Sexual Activity  . Sexual activity: No    Outpatient Encounter Prescriptions as of 10/13/2016  Medication Sig  . fluticasone (FLONASE) 50 MCG/ACT nasal spray Place 2 sprays into both nostrils at bedtime.  . lidocaine (LIDODERM) 5 % Place 1 patch onto the skin  daily. Remove & Discard patch within 12 hours or as directed by MD  . ranitidine (ZANTAC) 300 MG tablet Take 1 tablet before breakfast and dinner.  . SYMBICORT 160-4.5 MCG/ACT inhaler INHALE 2 PUFFS BY MOUTH IN THE MORNING, AND THEN ANOTHER 2 PUFFS ABOUT 12 HOURS LATER  . VENTOLIN HFA 108 (90 Base) MCG/ACT inhaler INHALE 2 PUFFS INTO THE LUNGS EVERY 4 HOURS AS NEEDED FOR WHEEZING OR SHORTNESS OF BREATH   No facility-administered encounter medications on file as of 10/13/2016.     Activities of Daily Living In your present state of health, do you have any difficulty performing the following activities: 10/13/2016 10/13/2016  Hearing? N N  Vision? Y Y  Comment vision is bad Patient states her vision is terrible  Difficulty concentrating or making decisions? N N  Walking or climbing stairs? Y Y  Comment due to back pain due to back pain   Dressing or bathing? N N  Doing errands, shopping? N N  Preparing Food and eating ? N N  Using the Toilet? N N  In the past six months, have you accidently leaked urine? N N  Do you have problems with loss  of bowel control? N N  Managing your Medications? N N  Managing your Finances? N N  Housekeeping or managing your Housekeeping? N N  Some recent data might be hidden    Patient Care Team: Shawnee Knapp, MD as PCP - General (Family Medicine) Pyrtle, Lajuan Lines, MD as Consulting Physician (Gastroenterology) Tanda Rockers, MD as Consulting Physician (Pulmonary Disease)    Assessment:     Exercise Activities and Dietary recommendations Current Exercise Habits: The patient does not participate in regular exercise at present, Exercise limited by: None identified  Goals    . Exercise daily          Patient would like to decrease her back pain and hopefully be able to start back walking daily.       Fall Risk Fall Risk  10/13/2016 06/08/2016 03/23/2016 03/16/2016 02/20/2016  Falls in the past year? No No No No No   Depression Screen PHQ 2/9 Scores  10/13/2016 06/08/2016 03/23/2016 03/16/2016  PHQ - 2 Score 0 0 0 0     Cognitive Function     6CIT Screen 10/13/2016  What Year? 0 points  What month? 0 points  What time? 0 points  Count back from 20 0 points  Months in reverse 0 points  Repeat phrase 0 points  Total Score 0     There is no immunization history on file for this patient. Screening Tests Health Maintenance  Topic Date Due  . INFLUENZA VACCINE  10/16/2017 (Originally 08/03/2016)  . DEXA SCAN  10/16/2017 (Originally 11/12/2013)  . TETANUS/TDAP  10/16/2017 (Originally 11/13/1967)  . PNA vac Low Risk Adult (1 of 2 - PCV13) 10/16/2017 (Originally 11/12/2013)  . MAMMOGRAM  02/04/2017  . COLONOSCOPY  01/24/2019  . Hepatitis C Screening  Completed      Plan:   I have personally reviewed and noted the following in the patient's chart:   . Medical and social history . Use of alcohol, tobacco or illicit drugs  . Current medications and supplements . Functional ability and status . Nutritional status . Physical activity . Advanced directives . List of other physicians . Hospitalizations, surgeries, and ER visits in previous 12 months . Vitals . Screenings to include cognitive, depression, and falls . Referrals and appointments  In addition, I have reviewed and discussed with patient certain preventive protocols, quality metrics, and best practice recommendations. A written personalized care plan for preventive services as well as general preventive health recommendations were provided to patient.     Andrez Grime, LPN  16/10/9602

## 2016-10-13 NOTE — Patient Instructions (Addendum)
Sheila Tucker , Thank you for taking time to come for your Medicare Wellness Visit. I appreciate your ongoing commitment to your health goals. Please review the following plan we discussed and let me know if I can assist you in the future.   Screening recommendations/referrals: Colonoscopy: up to date, next due 03/24/2024 Mammogram: up to date, next due 02/08/2017 Bone Density: declined Recommended yearly ophthalmology/optometry visit for glaucoma screening and checkup Recommended yearly dental visit for hygiene and checkup  Vaccinations: Influenza vaccine: declined  Pneumococcal vaccine: declined Tdap vaccine: declined Shingles vaccine: declined    Advanced directives: Please bring a copy of your POA (Power of Attorney) and/or Living Will to your next appointment.   Conditions/risks identified: hopefully you can decrease her back pain and hopefully be able to start back walking daily.   Next appointment: schedule follow up visit with PCP, 1 year for AWV   Preventive Care 13 Years and Older, Female Preventive care refers to lifestyle choices and visits with your health care provider that can promote health and wellness. What does preventive care include?  A yearly physical exam. This is also called an annual well check.  Dental exams once or twice a year.  Routine eye exams. Ask your health care provider how often you should have your eyes checked.  Personal lifestyle choices, including:  Daily care of your teeth and gums.  Regular physical activity.  Eating a healthy diet.  Avoiding tobacco and drug use.  Limiting alcohol use.  Practicing safe sex.  Taking low-dose aspirin every day.  Taking vitamin and mineral supplements as recommended by your health care provider. What happens during an annual well check? The services and screenings done by your health care provider during your annual well check will depend on your age, overall health, lifestyle risk factors, and  family history of disease. Counseling  Your health care provider may ask you questions about your:  Alcohol use.  Tobacco use.  Drug use.  Emotional well-being.  Home and relationship well-being.  Sexual activity.  Eating habits.  History of falls.  Memory and ability to understand (cognition).  Work and work Statistician.  Reproductive health. Screening  You may have the following tests or measurements:  Height, weight, and BMI.  Blood pressure.  Lipid and cholesterol levels. These may be checked every 5 years, or more frequently if you are over 105 years old.  Skin check.  Lung cancer screening. You may have this screening every year starting at age 60 if you have a 30-pack-year history of smoking and currently smoke or have quit within the past 15 years.  Fecal occult blood test (FOBT) of the stool. You may have this test every year starting at age 50.  Flexible sigmoidoscopy or colonoscopy. You may have a sigmoidoscopy every 5 years or a colonoscopy every 10 years starting at age 11.  Hepatitis C blood test.  Hepatitis B blood test.  Sexually transmitted disease (STD) testing.  Diabetes screening. This is done by checking your blood sugar (glucose) after you have not eaten for a while (fasting). You may have this done every 1-3 years.  Bone density scan. This is done to screen for osteoporosis. You may have this done starting at age 8.  Mammogram. This may be done every 1-2 years. Talk to your health care provider about how often you should have regular mammograms. Talk with your health care provider about your test results, treatment options, and if necessary, the need for more tests. Vaccines  Your health care provider may recommend certain vaccines, such as:  Influenza vaccine. This is recommended every year.  Tetanus, diphtheria, and acellular pertussis (Tdap, Td) vaccine. You may need a Td booster every 10 years.  Zoster vaccine. You may need this  after age 73.  Pneumococcal 13-valent conjugate (PCV13) vaccine. One dose is recommended after age 57.  Pneumococcal polysaccharide (PPSV23) vaccine. One dose is recommended after age 69. Talk to your health care provider about which screenings and vaccines you need and how often you need them. This information is not intended to replace advice given to you by your health care provider. Make sure you discuss any questions you have with your health care provider. Document Released: 01/16/2015 Document Revised: 09/09/2015 Document Reviewed: 10/21/2014 Elsevier Interactive Patient Education  2017 [place] Prevention in the Home Falls can cause injuries. They can happen to people of all ages. There are many things you can do to make your home safe and to help prevent falls. What can I do on the outside of my home?  Regularly fix the edges of walkways and driveways and fix any cracks.  Remove anything that might make you trip as you walk through a door, such as a raised step or threshold.  Trim any bushes or trees on the path to your home.  Use bright outdoor lighting.  Clear any walking paths of anything that might make someone trip, such as rocks or tools.  Regularly check to see if handrails are loose or broken. Make sure that both sides of any steps have handrails.  Any raised decks and porches should have guardrails on the edges.  Have any leaves, snow, or ice cleared regularly.  Use sand or salt on walking paths during winter.  Clean up any spills in your garage right away. This includes oil or grease spills. What can I do in the bathroom?  Use night lights.  Install grab bars by the toilet and in the tub and shower. Do not use towel bars as grab bars.  Use non-skid mats or decals in the tub or shower.  If you need to sit down in the shower, use a plastic, non-slip stool.  Keep the floor dry. Clean up any water that spills on the floor as soon as it  happens.  Remove soap buildup in the tub or shower regularly.  Attach bath mats securely with double-sided non-slip rug tape.  Do not have throw rugs and other things on the floor that can make you trip. What can I do in the bedroom?  Use night lights.  Make sure that you have a light by your bed that is easy to reach.  Do not use any sheets or blankets that are too big for your bed. They should not hang down onto the floor.  Have a firm chair that has side arms. You can use this for support while you get dressed.  Do not have throw rugs and other things on the floor that can make you trip. What can I do in the kitchen?  Clean up any spills right away.  Avoid walking on wet floors.  Keep items that you use a lot in easy-to-reach places.  If you need to reach something above you, use a strong step stool that has a grab bar.  Keep electrical cords out of the way.  Do not use floor polish or wax that makes floors slippery. If you must use wax, use non-skid floor wax.  Do  not have throw rugs and other things on the floor that can make you trip. What can I do with my stairs?  Do not leave any items on the stairs.  Make sure that there are handrails on both sides of the stairs and use them. Fix handrails that are broken or loose. Make sure that handrails are as long as the stairways.  Check any carpeting to make sure that it is firmly attached to the stairs. Fix any carpet that is loose or worn.  Avoid having throw rugs at the top or bottom of the stairs. If you do have throw rugs, attach them to the floor with carpet tape.  Make sure that you have a light switch at the top of the stairs and the bottom of the stairs. If you do not have them, ask someone to add them for you. What else can I do to help prevent falls?  Wear shoes that:  Do not have high heels.  Have rubber bottoms.  Are comfortable and fit you well.  Are closed at the toe. Do not wear sandals.  If you  use a stepladder:  Make sure that it is fully opened. Do not climb a closed stepladder.  Make sure that both sides of the stepladder are locked into place.  Ask someone to hold it for you, if possible.  Clearly mark and make sure that you can see:  Any grab bars or handrails.  First and last steps.  Where the edge of each step is.  Use tools that help you move around (mobility aids) if they are needed. These include:  Canes.  Walkers.  Scooters.  Crutches.  Turn on the lights when you go into a dark area. Replace any light bulbs as soon as they burn out.  Set up your furniture so you have a clear path. Avoid moving your furniture around.  If any of your floors are uneven, fix them.  If there are any pets around you, be aware of where they are.  Review your medicines with your doctor. Some medicines can make you feel dizzy. This can increase your chance of falling. Ask your doctor what other things that you can do to help prevent falls. This information is not intended to replace advice given to you by your health care provider. Make sure you discuss any questions you have with your health care provider. Document Released: 10/16/2008 Document Revised: 05/28/2015 Document Reviewed: 01/24/2014 Elsevier Interactive Patient Education  2017 [employer].

## 2016-10-18 ENCOUNTER — Other Ambulatory Visit: Payer: Self-pay | Admitting: Family Medicine

## 2016-10-18 ENCOUNTER — Other Ambulatory Visit: Payer: Self-pay | Admitting: Internal Medicine

## 2016-10-18 DIAGNOSIS — J4520 Mild intermittent asthma, uncomplicated: Secondary | ICD-10-CM

## 2016-10-21 ENCOUNTER — Telehealth: Payer: Self-pay | Admitting: Internal Medicine

## 2016-10-21 MED ORDER — RANITIDINE HCL 300 MG PO TABS: ORAL_TABLET | ORAL | 2 refills | Status: DC

## 2016-10-21 NOTE — Telephone Encounter (Signed)
I have spoken to patient. She needs an appointment for follow up with Dr Hilarie Fredrickson (per Amy's last note in 08-01-16, pt needed 6-8 week follow up for lymphocytic colitis and gerd). Patient was originally scheduled for appointment but this was cancelled due to provider sickness. Patient appointment has been rescheduled and I have sent ranitidine refill to pharmacy until her appointment on 12/23/16. She verbalizes understanding.

## 2016-12-22 ENCOUNTER — Other Ambulatory Visit: Payer: Self-pay | Admitting: Family Medicine

## 2016-12-22 DIAGNOSIS — J4520 Mild intermittent asthma, uncomplicated: Secondary | ICD-10-CM

## 2016-12-23 ENCOUNTER — Encounter: Payer: Self-pay | Admitting: Internal Medicine

## 2016-12-23 ENCOUNTER — Ambulatory Visit (INDEPENDENT_AMBULATORY_CARE_PROVIDER_SITE_OTHER): Payer: Medicare Other | Admitting: Internal Medicine

## 2016-12-23 VITALS — BP 158/88 | HR 80 | Ht 67.25 in | Wt 304.5 lb

## 2016-12-23 DIAGNOSIS — K52832 Lymphocytic colitis: Secondary | ICD-10-CM | POA: Diagnosis not present

## 2016-12-23 DIAGNOSIS — K219 Gastro-esophageal reflux disease without esophagitis: Secondary | ICD-10-CM | POA: Diagnosis not present

## 2016-12-23 DIAGNOSIS — Z86010 Personal history of colonic polyps: Secondary | ICD-10-CM

## 2016-12-23 NOTE — Progress Notes (Signed)
   Subjective:    Patient ID: Sheila Tucker, female    DOB: [birth year], 66 y.o.   MRN: [id number]  HPI Sheila Tucker is a 66 year old female with a history of lymphocytic colitis, diverticulosis, GERD, hypertension, asthma, obesity and hyperlipidemia who is here for follow-up.  She was seen on 08/01/2016 by Nicoletta Ba, PA-C.  At that time she was having uncontrolled GERD symptoms but had been hesitant for PPI due to her significant and severe prior lymphocytic colitis.  Amy placed her on higher dose ranitidine at 300 mg twice daily.  With this she reports excellent control of her reflux symptoms.  No heartburn.  No trouble swallowing.  No nausea or vomiting.  Good appetite.  She has not had any issues with recurrent diarrhea.  Bowel movements have been regular without blood in her stool or melena.  No abdominal pain.   Review of Systems As per HPI, otherwise negative  Current Medications, Allergies, Past Medical History, Past Surgical History, Family History and Social History were reviewed in [employer] record.     Objective:   Physical Exam BP (!) 158/88 (BP Location: Left Wrist, Patient Position: Sitting, Cuff Size: Normal)   Pulse 80 Comment: irregular  Ht 5' 7.25" (1.708 m)   Wt (!) 304 lb 8 oz (138.1 kg)   BMI 47.34 kg/m  Gen: awake, alert, NAD HEENT: anicteric, op clear CV: RRR, no mrg Pulm: CTA b/l Abd: soft, NT/ND, +BS throughout Ext: no c/c/e Neuro: nonfocal     Assessment & Plan:  66 year old female with a history of lymphocytic colitis, diverticulosis, GERD, hypertension, asthma, obesity and hyperlipidemia who is here for follow-up.  1.  GERD --well-controlled now on ranitidine 300 mg twice daily AC.  We will continue with this dosing regimen.  PPIs are being avoided due to her significant history of lymphocytic colitis.  Continue antireflux regimen.  2.  History of lymphocytic colitis --currently in remission and not on steroid therapy  currently.  3.  History of adenomatous colon polyps --surveillance colonoscopy recommended January 2021  Annual follow-up, sooner if needed 15 minutes spent with the patient today. Greater than 50% was spent in counseling and coordination of care with the patient

## 2016-12-23 NOTE — Patient Instructions (Signed)
Continue Zantac 300 mg twice daily.  Follow up with Dr Hilarie Fredrickson in 1 year.  If you are age 68 or older, your body mass index should be between 23-30. Your Body mass index is 47.34 kg/m. If this is out of the aforementioned range listed, please consider follow up with your Primary Care Provider.  If you are age 110 or younger, your body mass index should be between 19-25. Your Body mass index is 47.34 kg/m. If this is out of the aformentioned range listed, please consider follow up with your Primary Care Provider.

## 2017-02-17 ENCOUNTER — Other Ambulatory Visit: Payer: Self-pay | Admitting: Family Medicine

## 2017-02-17 DIAGNOSIS — J4520 Mild intermittent asthma, uncomplicated: Secondary | ICD-10-CM

## 2017-02-18 ENCOUNTER — Other Ambulatory Visit: Payer: Self-pay

## 2017-02-18 DIAGNOSIS — J4520 Mild intermittent asthma, uncomplicated: Secondary | ICD-10-CM

## 2017-02-18 MED ORDER — BUDESONIDE-FORMOTEROL FUMARATE 160-4.5 MCG/ACT IN AERO: INHALATION_SPRAY | RESPIRATORY_TRACT | 0 refills | Status: DC

## 2017-03-15 ENCOUNTER — Other Ambulatory Visit: Payer: Self-pay | Admitting: Internal Medicine

## 2017-03-20 ENCOUNTER — Other Ambulatory Visit: Payer: Self-pay | Admitting: Family Medicine

## 2017-03-20 DIAGNOSIS — J4520 Mild intermittent asthma, uncomplicated: Secondary | ICD-10-CM

## 2017-03-20 NOTE — Telephone Encounter (Signed)
Symbicort  LOV 03/23/16 with Dr[facility] [phone] - [address] [place]  [address]

## 2017-03-21 ENCOUNTER — Other Ambulatory Visit: Payer: Self-pay | Admitting: Family Medicine

## 2017-03-21 ENCOUNTER — Other Ambulatory Visit: Payer: Self-pay | Admitting: Internal Medicine

## 2017-03-21 NOTE — Telephone Encounter (Signed)
Lidocaine refill Last OV: 01/07/16 Telephone Encounter Last Refill:01/05/16 Pharmacy:[facility] [address] PCP: Delman Cheadle MD  Fluticasone refill Last OV: 12/22/15 Last Refill:12/22/15 [place] PCP: Delman Cheadle MD

## 2017-03-22 NOTE — Telephone Encounter (Signed)
Med filled 03/21/17

## 2017-03-22 NOTE — Telephone Encounter (Signed)
Fluticasone refill Last OV: 12/22/15 Last Refill:12/22/15 Pharmacy:[facility] [place] PCP: Delman Cheadle MD  Pt has a request that was sent in yesterday awaiting PCP approval/denial

## 2017-05-08 ENCOUNTER — Ambulatory Visit (INDEPENDENT_AMBULATORY_CARE_PROVIDER_SITE_OTHER): Payer: Medicare Other | Admitting: Physician Assistant

## 2017-05-08 ENCOUNTER — Encounter: Payer: Self-pay | Admitting: Physician Assistant

## 2017-05-08 ENCOUNTER — Other Ambulatory Visit: Payer: Self-pay

## 2017-05-08 VITALS — BP 126/80 | HR 90 | Temp 98.4°F | Ht 67.0 in | Wt 305.2 lb

## 2017-05-08 DIAGNOSIS — R350 Frequency of micturition: Secondary | ICD-10-CM

## 2017-05-08 LAB — POCT URINALYSIS DIP (MANUAL ENTRY)
BILIRUBIN UA: NEGATIVE
Glucose, UA: NEGATIVE mg/dL
Ketones, POC UA: NEGATIVE mg/dL
NITRITE UA: NEGATIVE
Protein Ur, POC: NEGATIVE mg/dL
Spec Grav, UA: 1.01 (ref 1.010–1.025)
UROBILINOGEN UA: 0.2 U/dL
pH, UA: 5.5 (ref 5.0–8.0)

## 2017-05-08 LAB — POC MICROSCOPIC URINALYSIS (UMFC)

## 2017-05-08 MED ORDER — CEPHALEXIN 500 MG PO CAPS: 500.0000 mg | ORAL_CAPSULE | Freq: Three times a day (TID) | ORAL | 0 refills | Status: AC

## 2017-05-08 NOTE — Patient Instructions (Signed)
     IF you received an x-ray today, you will receive an invoice from [facility]. Please contact [facility] at [phone] with questions or concerns regarding your invoice.   IF you received labwork today, you will receive an invoice from [facility]. Please contact [facility] at [phone] with questions or concerns regarding your invoice.   Our billing staff will not be able to assist you with questions regarding bills from these companies.  You will be contacted with the lab results as soon as they are available. The fastest way to get your results is to activate your My Chart account. Instructions are located on the last page of this paperwork. If you have not heard from us regarding the results in 2 weeks, please contact this office.     

## 2017-05-08 NOTE — Progress Notes (Signed)
05/08/2017 3:00 PM   DOB: 1950-02-20 / MRN: [id number]  SUBJECTIVE:  Sheila Tucker is a 67 y.o. female with a history of nephrolithiasis presenting for urinary frequency and urgency.  She associates urinary hesitancy and incomplete bladder.  She has had these symptoms before.  Denies fever, chills, nausea.  She is allergic to tetanus toxoid.   She  has a past medical history of Allergy, Asthma, Cancer ([place]), Cataract, Diverticulosis, GERD (gastroesophageal reflux disease), Hyperlipidemia, Hypertension, Lymphocytic colitis, and Tubular adenoma of colon.    She  reports that she has never smoked. She has never used smokeless tobacco. She reports that she does not drink alcohol or use drugs. She  reports that she does not engage in sexual activity. The patient  has a past surgical history that includes Appendectomy; Tonsillectomy; Shoulder surgery (2004/2006); Colonoscopy; Abdominal hysterectomy (2003); and Upper gastrointestinal endoscopy (2003).  Her family history includes Breast cancer in her paternal aunt; Colon cancer in her maternal grandfather, paternal grandfather, and paternal uncle; Diabetes in her paternal aunt; Heart attack (age of onset: 46) in her father.  Review of Systems  Constitutional: Negative for chills, diaphoresis, fever, malaise/fatigue and weight loss.  Respiratory: Negative for cough.   Gastrointestinal: Negative for nausea.  Genitourinary: Positive for flank pain, frequency and urgency. Negative for dysuria and hematuria.       Incomplete bladder emptying  Musculoskeletal: Negative for myalgias.  Neurological: Negative for dizziness.    The problem list and medications were reviewed and updated by myself where necessary and exist elsewhere in the encounter.   OBJECTIVE:  BP 126/80 (BP Location: Left Arm, Patient Position: Sitting, Cuff Size: Normal)   Pulse 90   Temp 98.4 F (36.9 C) (Oral)   Ht 5\' 7"  (1.702 m)   Wt (!) 305 lb 3.2 oz (138.4 kg)   SpO2 96%    BMI 47.80 kg/m   Physical Exam  Constitutional: She is oriented to person, place, and time. She appears well-nourished. No distress.  Eyes: Pupils are equal, round, and reactive to light. EOM are normal.  Cardiovascular: Normal rate, regular rhythm, S1 normal, S2 normal, normal heart sounds and intact distal pulses. Exam reveals no gallop, no friction rub and no decreased pulses.  No murmur heard. Pulmonary/Chest: Effort normal. She has no rales.  Abdominal: Soft. She exhibits no distension and no mass. There is no rebound and no guarding. No hernia.  Musculoskeletal: She exhibits no edema.  Neurological: She is alert and oriented to person, place, and time. No cranial nerve deficit. Gait normal.  Skin: Skin is dry. She is not diaphoretic.  Psychiatric: She has a normal mood and affect.  Vitals reviewed.   Wt Readings from Last 3 Encounters:  05/08/17 (!) 305 lb 3.2 oz (138.4 kg)  12/23/16 (!) 304 lb 8 oz (138.1 kg)  10/13/16 (!) 302 lb (137 kg)   Lab Results  Component Value Date   HGBA1C 5.8 (H) 08/20/2015   Results for orders placed or performed in visit on 05/08/17 (from the past 72 hour(s))  POCT urinalysis dipstick     Status: Abnormal   Collection Time: 05/08/17  2:36 PM  Result Value Ref Range   Color, UA yellow yellow   Clarity, UA clear clear   Glucose, UA negative negative mg/dL   Bilirubin, UA negative negative   Ketones, POC UA negative negative mg/dL   Spec Grav, UA 1.010 1.010 - 1.025   Blood, UA large (A) negative   pH, UA 5.5  5.0 - 8.0   Protein Ur, POC negative negative mg/dL   Urobilinogen, UA 0.2 0.2 or 1.0 E.U./dL   Nitrite, UA Negative Negative   Leukocytes, UA Small (1+) (A) Negative  POCT Microscopic Urinalysis ([facility])     Status: Abnormal   Collection Time: 05/08/17  2:50 PM  Result Value Ref Range   WBC,UR,HPF,POC Too numerous to count  (A) None WBC/hpf   RBC,UR,HPF,POC Few (A) None RBC/hpf   Bacteria Few (A) None, Too numerous to count    Mucus Present (A) Absent   Epithelial Cells, UR Per Microscopy Moderate (A) None, Too numerous to count cells/hpf   Lab Results  Component Value Date   CREATININE 0.77 02/19/2016     No results found.   ASSESSMENT AND PLAN:  Quanda was seen today for polyuria.  Diagnoses and all orders for this visit:  Urine frequency: Culture out.  Starting keflex.  Normal Cr this time last year.  -     POCT urinalysis dipstick -     POCT Microscopic Urinalysis ([facility]) -     Cancel: POCT CBC -     Urine Culture -     cephALEXin (KEFLEX) 500 MG capsule; Take 1 capsule (500 mg total) by mouth 3 (three) times daily for 10 days.    The patient is advised to call or return to clinic if she does not see an improvement in symptoms, or to seek the care of the closest emergency department if she worsens with the above plan.   Philis Fendt, MHS, PA-C [facility] 05/08/2017 3:00 PM

## 2017-05-10 LAB — URINE CULTURE

## 2017-05-18 IMAGING — DX DG CHEST 2V
2 series · 2 of 2 positions shown · non-contrast
Comparison: 10/28/2013

CLINICAL DATA: Short of breath. Wheezing. Symptoms for 1 week.
History of asthma.

EXAM:
CHEST  2 VIEW

[chest pa]
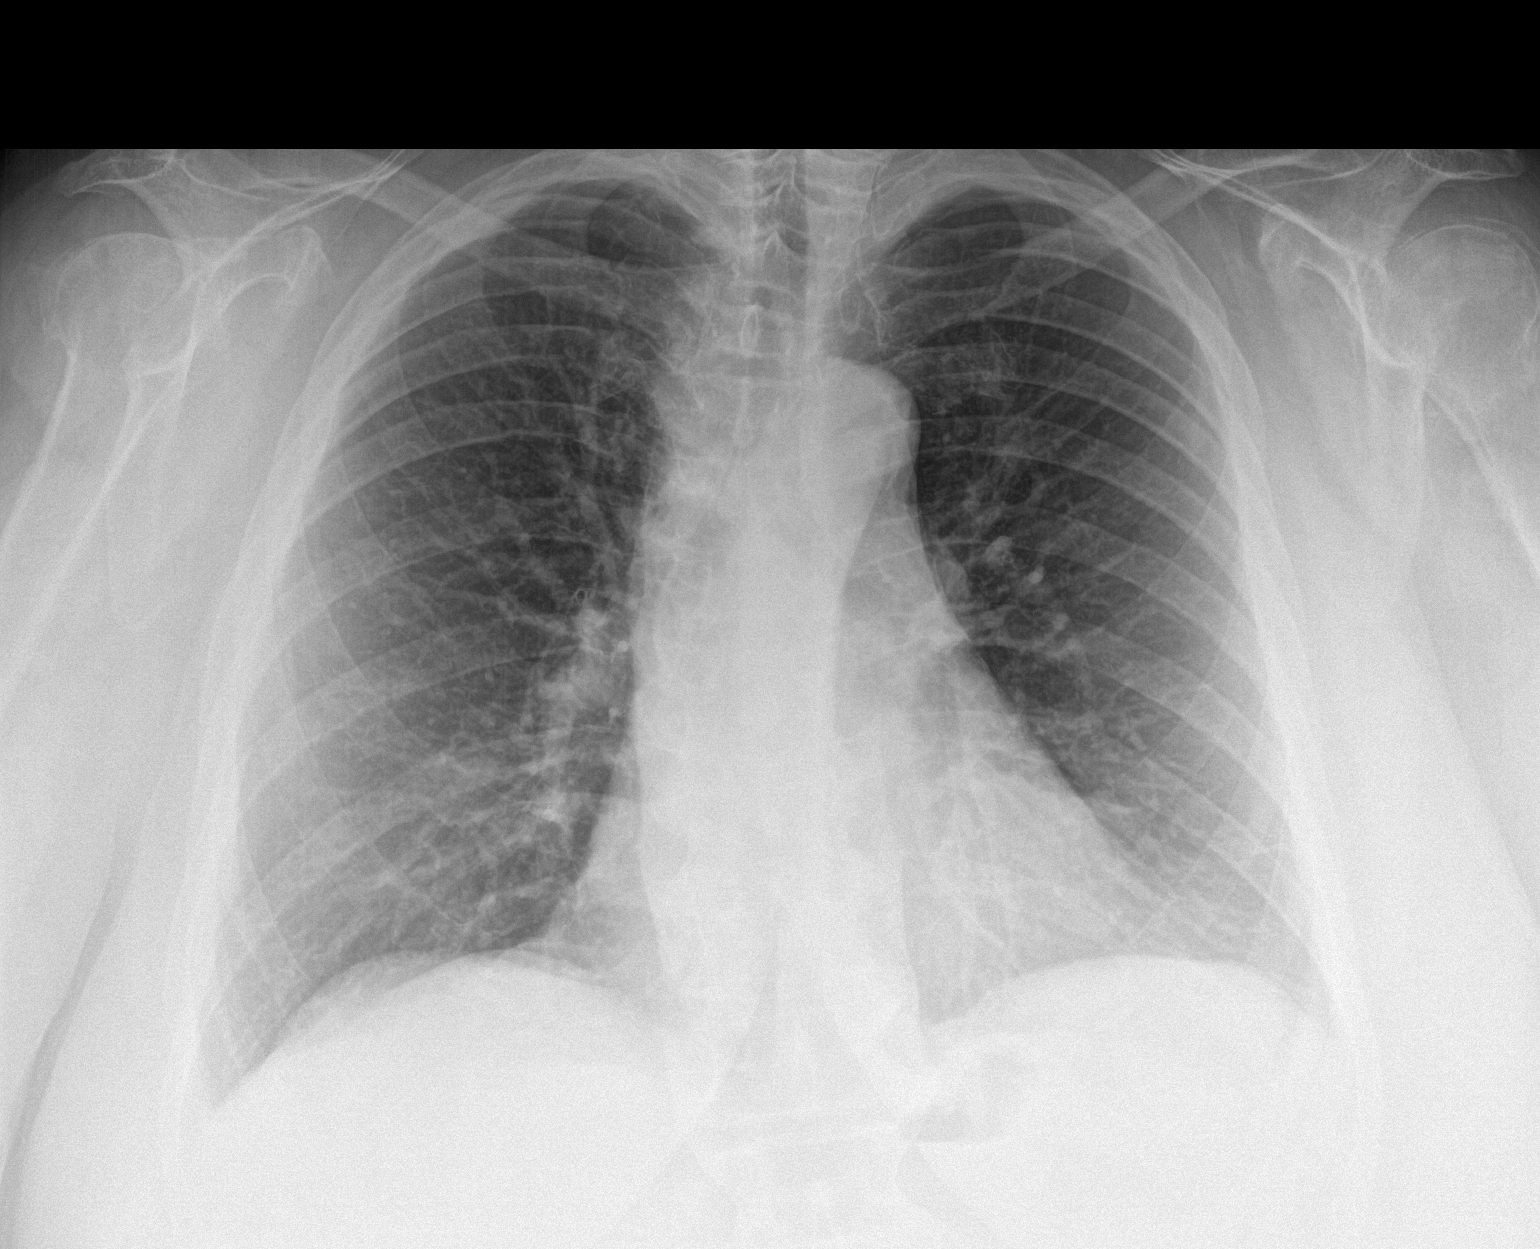

[chest lat]
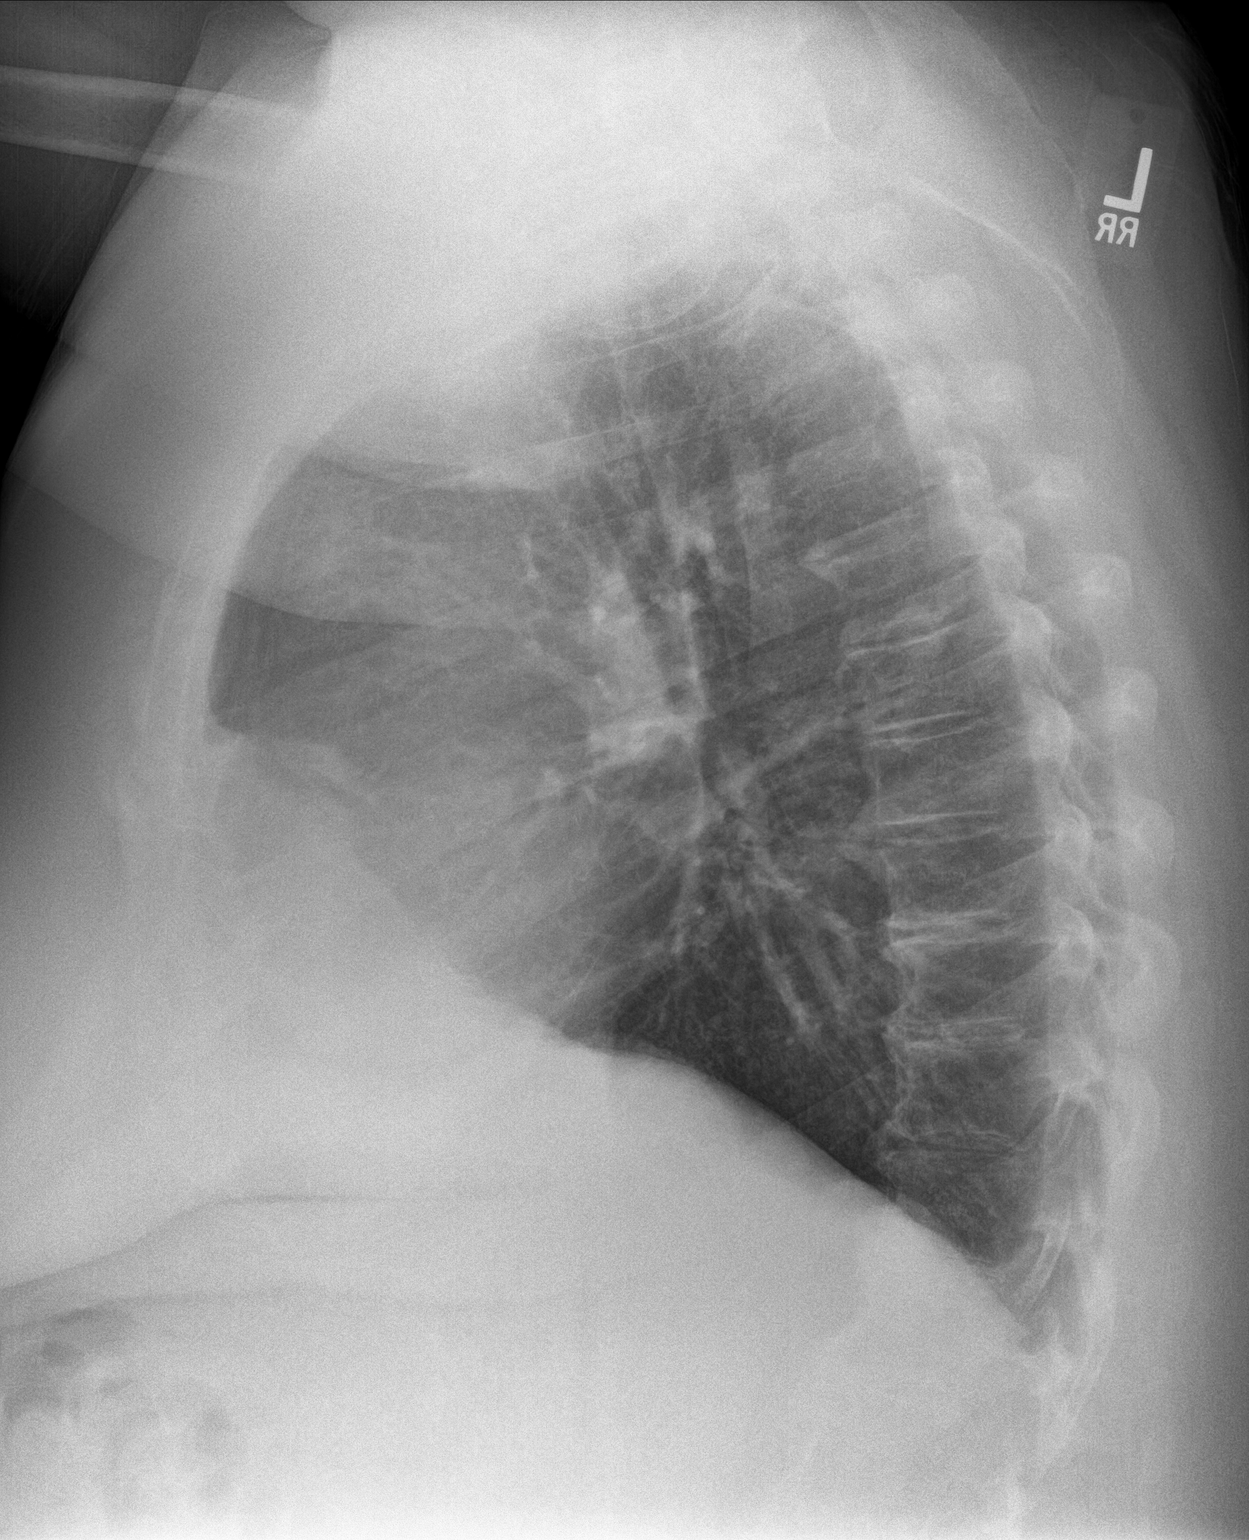

[2 of 2 positions shown; findings below may reference images not displayed]

FINDINGS: The heart is borderline enlarged. Normal vascularity. Clear lungs.
No pneumothorax or pleural effusion. Spondylitic changes in the
spine are noted. Round density projecting over the upper thoracic
spine is stable compared to the prior study. This supports benign
etiology. It is likely a bony abnormality.
IMPRESSION: No active cardiopulmonary disease.

## 2017-05-25 ENCOUNTER — Telehealth: Payer: Self-pay | Admitting: Family Medicine

## 2017-05-25 ENCOUNTER — Ambulatory Visit: Payer: Self-pay

## 2017-05-25 NOTE — Telephone Encounter (Signed)
Copied from [place] [id number]. Topic: Quick Communication - See Telephone Encounter >> May 25, 2017  9:32 AM Boyd Kerbs wrote: CRM for notification. See Telephone encounter for: 05/25/17.   Pt called saying her UTI started to go away but is back again.  Asking if can call more medication in for her.   [facility] [address] [place] [address] Phone: [phone] Fax: [phone]

## 2017-05-25 NOTE — Telephone Encounter (Signed)
Pt. C/o "bladder infection coming back." Frequency and does not feel like she is emptying well. States she finished her Keflex and yesterday started having symptoms again. "States I don't want it to get real bad." No available appointments today. Request we check with GI doctor Dr. Zenovia Jarred if it is ok for her to take Keflex due to her GI problems. Please advise pt.  Reason for Disposition . Urinating more frequently than usual (i.e., frequency)  Answer Assessment - Initial Assessment Questions 1. SYMPTOM: "What's the main symptom you're concerned about?" (e.g., frequency, incontinence)     Frequency 2. ONSET: "When did the  ________  start?"     Yesterday 3. PAIN: "Is there any pain?" If so, ask: "How bad is it?" (Scale: 1-10; mild, moderate, severe)     No pain "yet". 4. CAUSE: "What do you think is causing the symptoms?"     Bladder infection 5. OTHER SYMPTOMS: "Do you have any other symptoms?" (e.g., fever, flank pain, blood in urine, pain with urination)     Frequency and difficulty emptying 6. PREGNANCY: "Is there any chance you are pregnant?" "When was your last menstrual period?"     No  Protocols used: URINARY [facility]

## 2017-05-26 NOTE — Telephone Encounter (Signed)
Phone call to patient. Discussed symptoms from yesterday's conversation with Triage RN.  Discussed with patient that provider will want her to return to be seen. No available appointments today. Offered to schedule appointment today/tomorrow with different provider, she states she only wants to see Legrand Como since she was so impressed with him at her last visit. Appointment made for Tuesday 5/28. Patient advised to present to urgent care for symptoms of back pain worse than baseline, fever, vomiting, severe urinary discomfort. She is agreeable.

## 2017-05-26 NOTE — Telephone Encounter (Signed)
Pt called to check the status of request, other TE has been created under a nurse triage

## 2017-05-26 NOTE — Telephone Encounter (Signed)
Resolved, see separate phone encounter.

## 2017-05-26 NOTE — Telephone Encounter (Addendum)
Spoke with Larene Beach regarding pt request and she confirmed that the pt's request has been sent to the provider for review and that there is a 48-72 hour turn around period for refill requests; contacted the pt and gave her this information; the pt states that she previously requested for the provider to speak with Dr Hilarie Fredrickson regarding the prescribed antibiotic causing her lymphocytic colitis to flare up; also discussed with the pt that if her symptoms were concerning that an appointment could be offered to her pm 05/26/17 and she states that she could not leave work today; offered her an appointment on Saturday 05/27/17; she declines because the physician on duty would not be able to talk to Dr Hilarie Fredrickson regarding the prescribed antibiotic and it causing her lymphocytic colitis to flare up; explained to pt that she could discuss this issue with the provider; she states that "this will not work for her", and declines scheduling an appointment; will route to office for notification of this encounter.

## 2017-05-30 ENCOUNTER — Encounter: Payer: Self-pay | Admitting: Physician Assistant

## 2017-05-30 ENCOUNTER — Other Ambulatory Visit: Payer: Self-pay

## 2017-05-30 ENCOUNTER — Ambulatory Visit (INDEPENDENT_AMBULATORY_CARE_PROVIDER_SITE_OTHER): Payer: Medicare Other | Admitting: Physician Assistant

## 2017-05-30 VITALS — BP 132/80 | HR 96 | Temp 98.1°F | Ht 67.0 in | Wt 304.2 lb

## 2017-05-30 DIAGNOSIS — R350 Frequency of micturition: Secondary | ICD-10-CM

## 2017-05-30 LAB — POCT URINALYSIS DIP (MANUAL ENTRY)
Bilirubin, UA: NEGATIVE
GLUCOSE UA: NEGATIVE mg/dL
Ketones, POC UA: NEGATIVE mg/dL
NITRITE UA: NEGATIVE
Protein Ur, POC: NEGATIVE mg/dL
UROBILINOGEN UA: 0.2 U/dL
pH, UA: 5.5 (ref 5.0–8.0)

## 2017-05-30 LAB — POC MICROSCOPIC URINALYSIS (UMFC): MUCUS RE: ABSENT

## 2017-05-30 MED ORDER — NITROFURANTOIN MONOHYD MACRO 100 MG PO CAPS: 100.0000 mg | ORAL_CAPSULE | Freq: Two times a day (BID) | ORAL | 2 refills | Status: AC

## 2017-05-30 NOTE — Patient Instructions (Addendum)
Lets try the new antibiotic and I will culture.  Hopefully it will not flare your colon.     IF you received an x-ray today, you will receive an invoice from [facility]. Please contact [facility] at [phone] with questions or concerns regarding your invoice.   IF you received labwork today, you will receive an invoice from [facility]. Please contact [facility] at [phone] with questions or concerns regarding your invoice.   Our billing staff will not be able to assist you with questions regarding bills from these companies.  You will be contacted with the lab results as soon as they are available. The fastest way to get your results is to activate your My Chart account. Instructions are located on the last page of this paperwork. If you have not heard from [place] regarding the results in 2 weeks, please contact this office.
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
## 2017-06-01 LAB — URINE CULTURE

## 2017-06-28 DIAGNOSIS — H2513 Age-related nuclear cataract, bilateral: Secondary | ICD-10-CM | POA: Diagnosis not present

## 2017-07-17 ENCOUNTER — Other Ambulatory Visit: Payer: Self-pay | Admitting: Internal Medicine

## 2017-07-18 ENCOUNTER — Other Ambulatory Visit: Payer: Self-pay | Admitting: *Deleted

## 2017-07-18 ENCOUNTER — Telehealth: Payer: Self-pay | Admitting: Family Medicine

## 2017-07-18 DIAGNOSIS — J4520 Mild intermittent asthma, uncomplicated: Secondary | ICD-10-CM

## 2017-07-18 MED ORDER — BUDESONIDE-FORMOTEROL FUMARATE 160-4.5 MCG/ACT IN AERO: INHALATION_SPRAY | RESPIRATORY_TRACT | 0 refills | Status: DC

## 2017-07-18 NOTE — Telephone Encounter (Signed)
Call to patient- she reluctantly scheduled CPE. Refill of Symbicort sent to pharmacy patient to discuss her concerns and additional refills at appointment 08/04/17.

## 2017-07-18 NOTE — Telephone Encounter (Signed)
Copied from [place]. Topic: Quick Communication - See Telephone Encounter >> Jul 18, 2017 10:17 AM Hewitt Shorts wrote: Pt is checking on status of her request for Symbicort and she would like to get a year supply instead of her 90 days -she states it would be much easier for her   Isidore Moos -[phone]  Best number [phone]

## 2017-07-27 DIAGNOSIS — H5213 Myopia, bilateral: Secondary | ICD-10-CM | POA: Diagnosis not present

## 2017-07-27 DIAGNOSIS — H2513 Age-related nuclear cataract, bilateral: Secondary | ICD-10-CM | POA: Diagnosis not present

## 2017-08-04 ENCOUNTER — Encounter: Payer: Self-pay | Admitting: Family Medicine

## 2017-08-14 ENCOUNTER — Ambulatory Visit (INDEPENDENT_AMBULATORY_CARE_PROVIDER_SITE_OTHER): Payer: Medicare Other | Admitting: Family Medicine

## 2017-08-14 ENCOUNTER — Encounter: Payer: Self-pay | Admitting: Family Medicine

## 2017-08-14 VITALS — BP 126/82 | HR 100 | Temp 98.1°F | Resp 18 | Ht 67.0 in | Wt 299.4 lb

## 2017-08-14 DIAGNOSIS — J4540 Moderate persistent asthma, uncomplicated: Secondary | ICD-10-CM | POA: Diagnosis not present

## 2017-08-14 DIAGNOSIS — R739 Hyperglycemia, unspecified: Secondary | ICD-10-CM

## 2017-08-14 DIAGNOSIS — I10 Essential (primary) hypertension: Secondary | ICD-10-CM

## 2017-08-14 DIAGNOSIS — M4804 Spinal stenosis, thoracic region: Secondary | ICD-10-CM

## 2017-08-14 DIAGNOSIS — E8881 Metabolic syndrome: Secondary | ICD-10-CM

## 2017-08-14 DIAGNOSIS — J4520 Mild intermittent asthma, uncomplicated: Secondary | ICD-10-CM | POA: Diagnosis not present

## 2017-08-14 DIAGNOSIS — E782 Mixed hyperlipidemia: Secondary | ICD-10-CM

## 2017-08-14 DIAGNOSIS — R942 Abnormal results of pulmonary function studies: Secondary | ICD-10-CM | POA: Diagnosis not present

## 2017-08-14 DIAGNOSIS — J4550 Severe persistent asthma, uncomplicated: Secondary | ICD-10-CM | POA: Diagnosis not present

## 2017-08-14 DIAGNOSIS — R7303 Prediabetes: Secondary | ICD-10-CM | POA: Diagnosis not present

## 2017-08-14 MED ORDER — RANITIDINE HCL 300 MG PO TABS: 300.0000 mg | ORAL_TABLET | Freq: Two times a day (BID) | ORAL | 3 refills | Status: DC

## 2017-08-14 MED ORDER — FLUTICASONE PROPIONATE 50 MCG/ACT NA SUSP: NASAL | 5 refills | Status: DC

## 2017-08-14 MED ORDER — BUDESONIDE-FORMOTEROL FUMARATE 160-4.5 MCG/ACT IN AERO: 2.0000 | INHALATION_SPRAY | Freq: Two times a day (BID) | RESPIRATORY_TRACT | 11 refills | Status: DC

## 2017-08-14 MED ORDER — ALBUTEROL SULFATE HFA 108 (90 BASE) MCG/ACT IN AERS: INHALATION_SPRAY | RESPIRATORY_TRACT | 4 refills | Status: DC

## 2017-08-14 MED ORDER — LIDOCAINE 5 % EX PTCH: MEDICATED_PATCH | CUTANEOUS | 5 refills | Status: DC

## 2017-08-14 NOTE — Progress Notes (Signed)
Subjective:    Patient ID: Sheila Tucker, female    DOB: 02-Dec-1950, 67 y.o.   MRN: [id number] No chief complaint on file.   HPI   Last AWV 10/13/16 - sched for AWV in 2-3 mos.  Can't stand for >10 minutes, has to lean forward.  Did PT after shoulder surgery - off of [place] and worked very well - off of [address]   Lymphocytic microcolitis - see Dr. Hilarie Fredrickson - had terrible sxs - couldn't make it to work as was getting up 7x/night.  found on colonoscpy - so can't take nsaids. Has to run all rxs by GI dr first before she starts something He did not want her taking aciphex as could exac so has to stay on ranitidine. Does stay on tumeric Muscle relaxants don't work for her -  High pain tolerance fortunately as takes an elepaht gun to get her any relief. Past Medical History:  Diagnosis Date  . Allergy   . Asthma   . Cancer ([place])    uterine  . Cataract   . Diverticulosis   . GERD (gastroesophageal reflux disease)   . Hyperlipidemia    patient denies high lipids  . Hypertension    past, under control, no meds  . Lymphocytic colitis   . Tubular adenoma of colon    Past Surgical History:  Procedure Laterality Date  . ABDOMINAL HYSTERECTOMY  2003   & BSO for uterine cancer  . APPENDECTOMY    . COLONOSCOPY     tics and polyps (tubular adenomas), Neg in 2008  . SHOULDER SURGERY  2004/2006   both shoulders, Dr Alphonzo Cruise  . TONSILLECTOMY    . UPPER GASTROINTESTINAL ENDOSCOPY  2003   esophagitis, gastritis, duodenitis   Current Outpatient Medications on File Prior to Visit  Medication Sig Dispense Refill  . budesonide-formoterol (SYMBICORT) 160-4.5 MCG/ACT inhaler INHALE 2 PUFFS INTO THE LUNGS EVERY MORNING THEN 2 PUFFS ABOUT 12 HOURS LATER. CALL TO MAKE APPOINTMENT 10.2 g 0  . fluticasone (FLONASE) 50 MCG/ACT nasal spray SHAKE LIQUID AND USE 2 SPRAYS IN EACH NOSTRIL AT BEDTIME 16 g 0  . lidocaine (LIDODERM) 5 % PLACE 1 PATCH ONTO THE SKIN DAILY** REMOVE AND  DISCARD PATCH WITHIN 12 HOURS OR AS DIRECTED BY MD 30 patch 5  . ranitidine (ZANTAC) 300 MG tablet TAKE 1 TABLET BY MOUTH BEFORE BREAKFAST AND DINNER 60 tablet 3  . VENTOLIN HFA 108 (90 Base) MCG/ACT inhaler INHALE 2 PUFFS INTO THE LUNGS EVERY 4 HOURS AS NEEDED FOR WHEEZING OR SHORTNESS OF BREATH 3 Inhaler 4   No current facility-administered medications on file prior to visit.    Allergies  Allergen Reactions  . Tetanus Toxoid     REACTION: FEVER AND SWELLING  . Nsaids    Family History  Problem Relation Age of Onset  . Heart attack Father 60       Congential Valvular Heart disease  . Colon cancer Paternal Grandfather   . Colon cancer Maternal Grandfather   . Breast cancer Paternal Aunt   . Diabetes Paternal Aunt   . Colon cancer Paternal Uncle        X 2  . Stroke Neg Hx   . Stomach cancer Neg Hx    Social History   Socioeconomic History  . Marital status: Married    Spouse name: terry  . Number of children: Not on file  . Years of education: Not on file  . Highest education level: Not on  file  Occupational History  . Not on file  Social Needs  . Financial resource strain: Not on file  . Food insecurity:    Worry: Not on file    Inability: Not on file  . Transportation needs:    Medical: Not on file    Non-medical: Not on file  Tobacco Use  . Smoking status: Never Smoker  . Smokeless tobacco: Never Used  . Tobacco comment: Patient never smoked, around second hand smoke  Substance and Sexual Activity  . Alcohol use: No    Alcohol/week: 0.0 standard drinks  . Drug use: No  . Sexual activity: Never  Lifestyle  . Physical activity:    Days per week: Not on file    Minutes per session: Not on file  . Stress: Not on file  Relationships  . Social connections:    Talks on phone: Not on file    Gets together: Not on file    Attends religious service: Not on file    Active member of club or organization: Not on file    Attends meetings of clubs or organizations:  Not on file    Relationship status: Not on file  Other Topics Concern  . Not on file  Social History Narrative  . Not on file   Depression screen [facility] 2/9 08/14/2017 05/08/2017 10/13/2016 06/08/2016 03/23/2016  Decreased Interest 0 0 0 0 0  Down, Depressed, Hopeless 0 0 0 0 0  PHQ - 2 Score 0 0 0 0 0    Review of Systems See hpi    Objective:   Physical Exam  Constitutional: She is oriented to person, place, and time. She appears well-developed and well-nourished. No distress.  HENT:  Head: Normocephalic and atraumatic.  Right Ear: External ear normal.  Left Ear: External ear normal.  Eyes: Conjunctivae are normal. No scleral icterus.  Neck: Normal range of motion. Neck supple. No thyromegaly present.  Cardiovascular: Normal rate, regular rhythm, normal heart sounds and intact distal pulses.  Pulmonary/Chest: Effort normal and breath sounds normal. No respiratory distress.  Musculoskeletal: She exhibits no edema.  Lymphadenopathy:    She has no cervical adenopathy.  Neurological: She is alert and oriented to person, place, and time.  Skin: Skin is warm and dry. She is not diaphoretic. No erythema.  Psychiatric: She has a normal mood and affect. Her behavior is normal.   BP 126/82 (BP Location: Left Arm, Patient Position: Sitting, Cuff Size: Large)   Pulse 100   Temp 98.1 F (36.7 C) (Oral)   Resp 18   Wt 299 lb 6.4 oz (135.8 kg)   SpO2 95%   BMI 46.89 kg/m      Assessment & Plan:   1. Essential hypertension   2. Moderate persistent asthma without complication   3. HYPERLIPIDEMIA   4. Dysmetabolic syndrome X   5. Prediabetes   6. Elevated blood sugar   7. Asthma, chronic, severe persistent, uncomplicated   8. Spinal stenosis, thoracic   9. Vital capacity reduced   10. Asthma, chronic, mild intermittent, uncomplicated     Orders Placed This Encounter  Procedures  . Urinalysis, Routine w reflex microscopic  . Lipid panel    Order Specific Question:   Has the  patient fasted?    Answer:   Yes  . Comprehensive metabolic panel    Order Specific Question:   Has the patient fasted?    Answer:   Yes  . Hemoglobin A1c  . CBC with Differential/Platelet  .  TSH  . Ambulatory referral to Physical Therapy    Referral Priority:   Routine    Referral Type:   Physical Medicine    Referral Reason:   Specialty Services Required    Requested Specialty:   Physical Therapy    Number of Visits Requested:   1    Meds ordered this encounter  Medications  . budesonide-formoterol (SYMBICORT) 160-4.5 MCG/ACT inhaler    Sig: Inhale 2 puffs into the lungs 2 (two) times daily.    Dispense:  10.2 g    Refill:  11  . fluticasone (FLONASE) 50 MCG/ACT nasal spray    Sig: SHAKE LIQUID AND USE 2 SPRAYS IN EACH NOSTRIL AT BEDTIME    Dispense:  16 g    Refill:  5  . lidocaine (LIDODERM) 5 %    Sig: PLACE 1 PATCH ONTO THE SKIN DAILY. REMOVE AND DISCARD PATCH WITHIN 12 HOURS AS DIRECTED BY MD    Dispense:  30 patch    Refill:  5  . ranitidine (ZANTAC) 300 MG tablet    Sig: Take 1 tablet (300 mg total) by mouth 2 (two) times daily. As needed for heartburn/gerd    Dispense:  60 tablet    Refill:  3  . albuterol (VENTOLIN HFA) 108 (90 Base) MCG/ACT inhaler    Sig: INHALE 2 PUFFS INTO THE LUNGS EVERY 4 HOURS AS NEEDED FOR WHEEZING OR SHORTNESS OF BREATH    Dispense:  3 Inhaler    Refill:  4    Delman Cheadle, MD, MPH [facility] [address] [place], [place]  [address] [phone] Office phone  [phone] Office fax   10/23/17 7:10 AM

## 2017-08-14 NOTE — Patient Instructions (Addendum)
Consider a trial of tramadol for your back - ok to call if you would like to try this. You will get a call from PT    IF you received an x-ray today, you will receive an invoice from [facility]. Please contact [facility] at [phone] with questions or concerns regarding your invoice.   IF you received labwork today, you will receive an invoice from [facility]. Please contact [facility] at [phone] with questions or concerns regarding your invoice.   Our billing staff will not be able to assist you with questions regarding bills from these companies.  You will be contacted with the lab results as soon as they are available. The fastest way to get your results is to activate your My Chart account. Instructions are located on the last page of this paperwork. If you have not heard from [place] regarding the results in 2 weeks, please contact this office.     Asthma Attack Prevention, Adult Although you may not be able to control the fact that you have asthma, you can take actions to prevent episodes of asthma (asthma attacks). These actions include:  Creating a written plan for managing and treating your asthma attacks (asthma action plan).  Monitoring your asthma.  Avoiding things that can irritate your airways or make your asthma symptoms worse (asthma triggers).  Taking your medicines as directed.  Acting quickly if you have signs or symptoms of an asthma attack.  What are some ways to prevent an asthma attack? Create a plan Work with your health care provider to create an asthma action plan. This plan should include:  A list of your asthma triggers and how to avoid them.  A list of symptoms that you experience during an asthma attack.  Information about when to take medicine and how much medicine to take.  Information to help you understand your peak flow measurements.  Contact information for your health care providers.  Daily actions that you can take to  control asthma.  Monitor your asthma  To monitor your asthma:  Use your peak flow meter every morning and every evening for 2-3 weeks. Record the results in a journal. A drop in your peak flow numbers on one or more days may mean that you are starting to have an asthma attack, even if you are not having symptoms.  When you have asthma symptoms, write them down in a journal.  Avoid asthma triggers  Work with your health care provider to find out what your asthma triggers are. This can be done by:  Being tested for allergies.  Keeping a journal that notes when asthma attacks occur and what may have contributed to them.  Asking your health care provider whether other medical conditions make your asthma worse.  Common asthma triggers include:  Dust.  Smoke. This includes campfire smoke and secondhand smoke from tobacco products.  Pet dander.  Trees, grasses or pollens.  Very cold, dry, or humid air.  Mold.  Foods that contain high amounts of sulfites.  Strong smells.  Engine exhaust and air pollution.  Aerosol sprays and fumes from household cleaners.  Household pests and their droppings, including dust mites and cockroaches.  Certain medicines, including NSAIDs.  Once you have determined your asthma triggers, take steps to avoid them. Depending on your triggers, you may be able to reduce the chance of an asthma attack by:  Keeping your home clean. Have someone dust and vacuum your home for you 1 or 2 times a week.  If possible, have them use a high-efficiency particulate arrestance (HEPA) vacuum.  Washing your sheets weekly in hot water.  Using allergy-proof mattress covers and casings on your bed.  Keeping pets out of your home.  Taking care of mold and water problems in your home.  Avoiding areas where people smoke.  Avoiding using strong perfumes or odor sprays.  Avoid spending a lot of time outdoors when pollen counts are high and on very windy  days.  Talking with your health care provider before stopping or starting any new medicines.  Medicines Take over-the-counter and prescription medicines only as told by your health care provider. Many asthma attacks can be prevented by carefully following your medicine schedule. Taking your medicines correctly is especially important when you cannot avoid certain asthma triggers. Even if you are doing well, do not stop taking your medicine and do not take less medicine. Act quickly If an asthma attack happens, acting quickly can decrease how severe it is and how long it lasts. Take these actions:  Pay attention to your symptoms. If you are coughing, wheezing, or having difficulty breathing, do not wait to see if your symptoms go away on their own. Follow your asthma action plan.  If you have followed your asthma action plan and your symptoms are not improving, call your health care provider or seek immediate medical care at the nearest hospital.  It is important to write down how often you need to use your fast-acting rescue inhaler. You can track how often you use an inhaler in your journal. If you are using your rescue inhaler more often, it may mean that your asthma is not under control. Adjusting your asthma treatment plan may help you to prevent future asthma attacks and help you to gain better control of your condition. How can I prevent an asthma attack when I exercise?  Exercise is a common asthma trigger. To prevent asthma attacks during exercise:  Follow advice from your health care provider about whether you should use your fast-acting inhaler before exercising. Many people with asthma experience exercise-induced bronchoconstriction (EIB). This condition often worsens during vigorous exercise in cold, humid, or dry environments. Usually, people with EIB can stay very active by using a fast-acting inhaler before exercising.  Avoid exercising outdoors in very cold or humid  weather.  Avoid exercising outdoors when pollen counts are high.  Warm up and cool down when exercising.  Stop exercising right away if asthma symptoms start.  Consider taking part in exercises that are less likely to cause asthma symptoms such as:  Indoor swimming.  Biking.  Walking.  Hiking.  Playing football.  This information is not intended to replace advice given to you by your health care provider. Make sure you discuss any questions you have with your health care provider. Document Released: 12/08/2008 Document Revised: 08/21/2015 Document Reviewed: 06/06/2015 Elsevier Interactive Patient Education  [employer].

## 2017-08-15 LAB — CBC WITH DIFFERENTIAL/PLATELET
BASOS: 1 %
Basophils Absolute: 0 10*3/uL (ref 0.0–0.2)
EOS (ABSOLUTE): 0.1 10*3/uL (ref 0.0–0.4)
Eos: 2 %
HEMOGLOBIN: 14.8 g/dL (ref 11.1–15.9)
Hematocrit: 46.6 % (ref 34.0–46.6)
Immature Grans (Abs): 0 10*3/uL (ref 0.0–0.1)
Immature Granulocytes: 0 %
Lymphocytes Absolute: 1.5 10*3/uL (ref 0.7–3.1)
Lymphs: 24 %
MCH: 28 pg (ref 26.6–33.0)
MCHC: 31.8 g/dL (ref 31.5–35.7)
MCV: 88 fL (ref 79–97)
MONOCYTES: 7 %
Monocytes Absolute: 0.5 10*3/uL (ref 0.1–0.9)
NEUTROS ABS: 4 10*3/uL (ref 1.4–7.0)
Neutrophils: 66 %
Platelets: 221 10*3/uL (ref 150–450)
RBC: 5.29 x10E6/uL — ABNORMAL HIGH (ref 3.77–5.28)
RDW: 15 % (ref 12.3–15.4)
WBC: 6.1 10*3/uL (ref 3.4–10.8)

## 2017-08-15 LAB — LIPID PANEL
Chol/HDL Ratio: 3.6 ratio (ref 0.0–4.4)
Cholesterol, Total: 165 mg/dL (ref 100–199)
HDL: 46 mg/dL (ref >39)
LDL Calculated: 96 mg/dL (ref 0–99)
TRIGLYCERIDES: 113 mg/dL (ref 0–149)
VLDL CHOLESTEROL CAL: 23 mg/dL (ref 5–40)

## 2017-08-15 LAB — COMPREHENSIVE METABOLIC PANEL
ALBUMIN: 3.5 g/dL — AB (ref 3.6–4.8)
ALT: 8 IU/L (ref 0–32)
AST: 12 IU/L (ref 0–40)
Albumin/Globulin Ratio: 1.4 (ref 1.2–2.2)
Alkaline Phosphatase: 81 IU/L (ref 39–117)
BUN / CREAT RATIO: 13 (ref 12–28)
BUN: 10 mg/dL (ref 8–27)
Bilirubin Total: 0.5 mg/dL (ref 0.0–1.2)
CALCIUM: 8.8 mg/dL (ref 8.7–10.3)
CO2: 27 mmol/L (ref 20–29)
CREATININE: 0.77 mg/dL (ref 0.57–1.00)
Chloride: 100 mmol/L (ref 96–106)
GFR, EST AFRICAN AMERICAN: 92 mL/min/{1.73_m2} (ref >59)
GFR, EST NON AFRICAN AMERICAN: 80 mL/min/{1.73_m2} (ref >59)
Globulin, Total: 2.5 g/dL (ref 1.5–4.5)
Glucose: 96 mg/dL (ref 65–99)
Potassium: 4.3 mmol/L (ref 3.5–5.2)
SODIUM: 142 mmol/L (ref 134–144)
Total Protein: 6 g/dL (ref 6.0–8.5)

## 2017-08-15 LAB — HEMOGLOBIN A1C
Est. average glucose Bld gHb Est-mCnc: 131 mg/dL
Hgb A1c MFr Bld: 6.2 % — ABNORMAL HIGH (ref 4.8–5.6)

## 2017-08-15 LAB — TSH: TSH: 1.7 u[IU]/mL (ref 0.450–4.500)

## 2017-08-17 ENCOUNTER — Ambulatory Visit: Payer: Medicare Other | Admitting: Physical Therapy

## 2017-08-22 DIAGNOSIS — H2512 Age-related nuclear cataract, left eye: Secondary | ICD-10-CM | POA: Diagnosis not present

## 2017-08-22 DIAGNOSIS — H25812 Combined forms of age-related cataract, left eye: Secondary | ICD-10-CM | POA: Diagnosis not present

## 2017-09-20 ENCOUNTER — Telehealth: Payer: Self-pay | Admitting: Family Medicine

## 2017-09-20 NOTE — Telephone Encounter (Signed)
Called place to patient requarding medication question about Macrobid. Pt states that she actually wants help getting her lidocaine patches.  Insurance is refusing payment.  She has contacted [facility] who stated they have sent papers to be signed by Dr Brigitte Pulse to the [place] office. Pt states it has been about 3 weeks and [facility] says they still have not received ok from her insurance.  Please advise.     [id number] - [place], [place] [phone] (Phone) [phone] (Fax)

## 2017-09-20 NOTE — Telephone Encounter (Signed)
Copied from [place] [id number]. Topic: Quick Communication - See Telephone Encounter >> Sep 20, 2017  3:36 PM Reyne Dumas L wrote: CRM for notification. See Telephone encounter for: 09/20/17.  lidocaine (LIDODERM) 5 %  Pt states that she needs to have a note from the doctor stating she uses these so that insurance will cover for her. Pt can be reached at [phone]

## 2017-09-20 NOTE — Telephone Encounter (Signed)
Copied from [place] [id number]. Topic: Quick Communication - Rx Refill/Question >> Sep 20, 2017  3:38 PM Sheppard Coil, [employer] L wrote: Medication: (MACROBID) 100 MG capsule; Take 1 capsule (100 mg total) by mouth 2 (two) times daily for 5 days.  Has the patient contacted their pharmacy? Yes - states they need new script for UTI (Agent: If no, request that the patient contact the pharmacy for the refill.) (Agent: If yes, when and what did the pharmacy advise?)  Preferred Pharmacy (with phone number or street name): [id number] - [place], [place] [phone] (Phone) [phone] (Fax)  Agent: Please be advised that RX refills may take up to 3 business days. We ask that you follow-up with your pharmacy.

## 2017-09-27 NOTE — Telephone Encounter (Signed)
KEY: [id number]

## 2017-09-27 NOTE — Telephone Encounter (Signed)
Spoke with husband Sophira is going to call back with insurance info for medications couldn't get medicare of [place] to go through.

## 2017-10-03 DIAGNOSIS — H2511 Age-related nuclear cataract, right eye: Secondary | ICD-10-CM | POA: Diagnosis not present

## 2017-10-03 DIAGNOSIS — H25811 Combined forms of age-related cataract, right eye: Secondary | ICD-10-CM | POA: Diagnosis not present

## 2017-10-09 ENCOUNTER — Telehealth: Payer: Self-pay | Admitting: Family Medicine

## 2017-10-09 NOTE — Telephone Encounter (Signed)
Copied from [place] [id number]. Topic: General - Other >> Oct 09, 2017 11:23 AM Leward Quan A wrote: Reason for CRM:  Patient called to request a call back from a supervisor regarding an ongoing issue with a medication (lidocaine (LIDODERM) 5 %) stated that she have spoken with Ins[employer] Company and now she need to speak to the supervisor to resolve the ongoing issue.

## 2017-11-20 ENCOUNTER — Other Ambulatory Visit: Payer: Self-pay | Admitting: Family Medicine

## 2017-11-21 NOTE — Telephone Encounter (Signed)
Requested Prescriptions  Pending Prescriptions Disp Refills  . albuterol (PROVENTIL HFA;VENTOLIN HFA) 108 (90 Base) MCG/ACT inhaler [Pharmacy Med Name: ALBUTEROL HFA INH (200 PUFFS) 18GM] 54 g 0    Sig: INHALE 2 PUFFS INTO THE LUNGS EVERY 4 HOURS AS NEEDED FOR WHEEZING OR SHORTNESS OF BREATH     Pulmonology:  Beta Agonists Failed - 11/20/2017  9:09 AM      Failed - One inhaler should last at least one month. If the patient is requesting refills earlier, contact the patient to check for uncontrolled symptoms.      Passed - Valid encounter within last 12 months    Recent Outpatient Visits          3 months ago Essential hypertension   [facility], Laurey Arrow, MD   5 months ago Urine frequency   [facility], PA-C   6 months ago Urine frequency   [facility], PA-C   1 year ago Dysuria   [facility], Laurey Arrow, MD   1 year ago Acute bronchitis, unspecified organism   [facility], Kayleen Memos, MD

## 2018-01-11 ENCOUNTER — Ambulatory Visit: Payer: Self-pay

## 2018-01-11 NOTE — Telephone Encounter (Signed)
Patient called and says that she will need a new prescription sent for Ventolin inhaler, not proair. She says she picked up proair the last time and it doesn't work. She says that her insurance company sent a letter to be filled out and it will need to be renewed this year. I advised it is not in her records, that I could see. She says she will call and have them email it to her and she will bring it in to be filled out. She asked for an appointment for med refill, appointment scheduled on Thursday, 02/01/18 at 1520 with Dr. Carlota Raspberry.   [facility] was called and spoke to [place], [facility] who says the patient has picked up ProAir all of 2019. The patient was made aware and says she hasn't used it and will wait until she can get Ventolin.

## 2018-01-12 ENCOUNTER — Telehealth: Payer: Self-pay | Admitting: Family Medicine

## 2018-01-12 NOTE — Telephone Encounter (Signed)
Pt is requesting a prescription for Ventolin.   She arrived to office requesting a prior auth for Ventolin. Pt states she had been on Ventolin for and was switched to [employer].  Has found that when she uses Proair, it does not work as well and she requires it more frequently.  Requesting to be Rx Ventolin again.    If Ventolin is prescribed, will need to go through Prior Auth process via [phone].

## 2018-01-12 NOTE — Telephone Encounter (Signed)
We received a PA form from [facility] for VENTOLIN HFA  I have placed form in Dr. Raul Del box at the nusre's station.

## 2018-01-24 DIAGNOSIS — M25572 Pain in left ankle and joints of left foot: Secondary | ICD-10-CM | POA: Diagnosis not present

## 2018-01-24 DIAGNOSIS — M79672 Pain in left foot: Secondary | ICD-10-CM | POA: Diagnosis not present

## 2018-01-24 DIAGNOSIS — M21622 Bunionette of left foot: Secondary | ICD-10-CM | POA: Diagnosis not present

## 2018-01-24 DIAGNOSIS — M7752 Other enthesopathy of left foot: Secondary | ICD-10-CM | POA: Diagnosis not present

## 2018-02-01 ENCOUNTER — Other Ambulatory Visit: Payer: Self-pay | Admitting: Family Medicine

## 2018-02-01 ENCOUNTER — Ambulatory Visit (INDEPENDENT_AMBULATORY_CARE_PROVIDER_SITE_OTHER): Payer: Medicare Other | Admitting: Family Medicine

## 2018-02-01 ENCOUNTER — Encounter: Payer: Self-pay | Admitting: Family Medicine

## 2018-02-01 VITALS — BP 150/86 | HR 90 | Temp 98.3°F | Ht 67.0 in | Wt 308.0 lb

## 2018-02-01 DIAGNOSIS — M25511 Pain in right shoulder: Secondary | ICD-10-CM

## 2018-02-01 DIAGNOSIS — G8929 Other chronic pain: Secondary | ICD-10-CM | POA: Diagnosis not present

## 2018-02-01 DIAGNOSIS — J309 Allergic rhinitis, unspecified: Secondary | ICD-10-CM | POA: Diagnosis not present

## 2018-02-01 DIAGNOSIS — M2550 Pain in unspecified joint: Secondary | ICD-10-CM

## 2018-02-01 DIAGNOSIS — M549 Dorsalgia, unspecified: Secondary | ICD-10-CM

## 2018-02-01 DIAGNOSIS — K219 Gastro-esophageal reflux disease without esophagitis: Secondary | ICD-10-CM | POA: Diagnosis not present

## 2018-02-01 DIAGNOSIS — M25512 Pain in left shoulder: Secondary | ICD-10-CM

## 2018-02-01 DIAGNOSIS — J4520 Mild intermittent asthma, uncomplicated: Secondary | ICD-10-CM | POA: Diagnosis not present

## 2018-02-01 MED ORDER — ALBUTEROL SULFATE HFA 108 (90 BASE) MCG/ACT IN AERS: INHALATION_SPRAY | RESPIRATORY_TRACT | 0 refills | Status: DC

## 2018-02-01 MED ORDER — FLUTICASONE PROPIONATE 50 MCG/ACT NA SUSP: NASAL | 11 refills | Status: DC

## 2018-02-01 MED ORDER — BUDESONIDE-FORMOTEROL FUMARATE 160-4.5 MCG/ACT IN AERO: 2.0000 | INHALATION_SPRAY | Freq: Two times a day (BID) | RESPIRATORY_TRACT | 11 refills | Status: AC

## 2018-02-01 MED ORDER — RANITIDINE HCL 300 MG PO TABS: 300.0000 mg | ORAL_TABLET | Freq: Two times a day (BID) | ORAL | 3 refills | Status: DC

## 2018-02-01 MED ORDER — VENTOLIN HFA 108 (90 BASE) MCG/ACT IN AERS: INHALATION_SPRAY | RESPIRATORY_TRACT | 5 refills | Status: AC

## 2018-02-01 MED ORDER — LIDOCAINE 5 % EX OINT: 1.0000 "application " | TOPICAL_OINTMENT | CUTANEOUS | 11 refills | Status: AC | PRN

## 2018-02-01 NOTE — Progress Notes (Signed)
Subjective:    Patient ID: Sheila Tucker, female    DOB: 06/24/1950, 68 y.o.   MRN: [id number]  HPI  Ellice Boultinghouse is a 68 y.o. female Presents today for: Chief Complaint  Patient presents with  . Medication Refill    VENTOLIN and for a 6 month supply for all meds   pcp is Shawnee Knapp, MD  Here for med refills. Has had some issues with med refills.   Asthma:  Using symbicort 2 puffs BID, same dose for 4-5 years.  No recent missed doses.  Albuterol "does not work" as it has ethanol. Ventolin does not have ethanol and worked ok. Ventolin has been approved by insurance - requests only ventolin. -uses ventolin twice per week most week. Sometimes has to use daily, but infrequent use. - no recent thrush.   GERD: - taking ranitidine 300mg  BID, GI- Dr. Hilarie Fredrickson. Hx of lymphocytic microcolitis - advised to stay on this regimen.  All rhinitis: Using flonase 2 spr per nostril QD. Working well for allergies, no epistaxis, or other new side effects.   Hx of shoulder surgeries, and back pain.  Seen by Dr. Louanne Skye..  No surgery.  Uses lidocaine path as needed to affected areas. About once a week when it flairs. Patch will not be covered, but ointment will be covered.   HM: declines tetanus vaccine (allergic)., declines bone density testing, pneumonia vaccine,  mammogram and flu shot    Patient Active Problem List   Diagnosis Date Noted  . Lymphocytic colitis 03/25/2014  . Dyspnea 10/28/2013  . Colon polyps 07/09/2010  . Diverticulosis 07/09/2010  . Essential hypertension 01/27/2009  . HYPERLIPIDEMIA 02/20/2007  . DYSMETABOLIC SYNDROME X 64/40/3474  . Asthma 02/20/2007  . GERD 02/20/2007   Past Medical History:  Diagnosis Date  . Allergy   . Asthma   . Cancer ([place])    uterine  . Cataract   . Diverticulosis   . GERD (gastroesophageal reflux disease)   . Hyperlipidemia    patient denies high lipids  . Hypertension    past, under control, no meds  . Lymphocytic colitis   . Tubular  adenoma of colon    Past Surgical History:  Procedure Laterality Date  . ABDOMINAL HYSTERECTOMY  2003   & BSO for uterine cancer  . APPENDECTOMY    . COLONOSCOPY     tics and polyps (tubular adenomas), Neg in 2008  . SHOULDER SURGERY  2004/2006   both shoulders, Dr Alphonzo Cruise  . TONSILLECTOMY    . UPPER GASTROINTESTINAL ENDOSCOPY  2003   esophagitis, gastritis, duodenitis   Allergies  Allergen Reactions  . Tetanus Toxoid     REACTION: FEVER AND SWELLING  . Nsaids    Prior to Admission medications   Medication Sig Start Date End Date Taking? Authorizing Provider  albuterol (VENTOLIN HFA) 108 (90 Base) MCG/ACT inhaler INHALE 2 PUFFS INTO THE LUNGS EVERY 4 HOURS AS NEEDED FOR WHEEZING OR SHORTNESS OF BREATH Patient taking differently: INHALE 2 PUFFS INTO THE LUNGS EVERY 4 HOURS AS NEEDED FOR WHEEZING OR SHORTNESS OF BREATH  BRAND NAME ONLY (VENTOLIN HFA) 08/14/17  Yes Shawnee Knapp, MD  budesonide-formoterol [facility]) 160-4.5 MCG/ACT inhaler Inhale 2 puffs into the lungs 2 (two) times daily. 08/14/17  Yes Shawnee Knapp, MD  fluticasone [facility]) 50 MCG/ACT nasal spray SHAKE LIQUID AND USE 2 SPRAYS IN EACH NOSTRIL AT BEDTIME 08/14/17  Yes Shawnee Knapp, MD  lidocaine (XYLOCAINE) 5 % ointment Apply 1 application topically as  needed.   Yes [provider]  ranitidine (ZANTAC) 300 MG tablet Take 1 tablet (300 mg total) by mouth 2 (two) times daily. As needed for heartburn/gerd 08/14/17  Yes Shawnee Knapp, MD   Social History   Socioeconomic History  . Marital status: Married    Spouse name: terry  . Number of children: Not on file  . Years of education: Not on file  . Highest education level: Not on file  Occupational History  . Not on file  Social Needs  . Financial resource strain: Not on file  . Food insecurity:    Worry: Not on file    Inability: Not on file  . Transportation needs:    Medical: Not on file    Non-medical: Not on file  Tobacco Use  . Smoking status: Never  Smoker  . Smokeless tobacco: Never Used  . Tobacco comment: Patient never smoked, around second hand smoke  Substance and Sexual Activity  . Alcohol use: No    Alcohol/week: 0.0 standard drinks  . Drug use: No  . Sexual activity: Never  Lifestyle  . Physical activity:    Days per week: Not on file    Minutes per session: Not on file  . Stress: Not on file  Relationships  . Social connections:    Talks on phone: Not on file    Gets together: Not on file    Attends religious service: Not on file    Active member of club or organization: Not on file    Attends meetings of clubs or organizations: Not on file    Relationship status: Not on file  . Intimate partner violence:    Fear of current or ex partner: Not on file    Emotionally abused: Not on file    Physically abused: Not on file    Forced sexual activity: Not on file  Other Topics Concern  . Not on file  Social History Narrative  . Not on file    Review of Systems Per HPI.     Objective:   Physical Exam Vitals signs reviewed.  Constitutional:      General: She is not in acute distress.    Appearance: She is well-developed.  HENT:     Head: Normocephalic and atraumatic.     Right Ear: Hearing, tympanic membrane, ear canal and external ear normal.     Left Ear: Hearing, tympanic membrane, ear canal and external ear normal.     Nose: Nose normal.     Mouth/Throat:     Pharynx: No oropharyngeal exudate.  Eyes:     Conjunctiva/sclera: Conjunctivae normal.     Pupils: Pupils are equal, round, and reactive to light.  Cardiovascular:     Rate and Rhythm: Normal rate and regular rhythm.     Heart sounds: Normal heart sounds. No murmur.  Pulmonary:     Effort: Pulmonary effort is normal. No respiratory distress.     Breath sounds: Normal breath sounds. No wheezing or rhonchi.  Skin:    General: Skin is warm and dry.     Findings: No rash.  Neurological:     Mental Status: She is alert and oriented to person,  place, and time.  Psychiatric:        Behavior: Behavior normal.    Vitals:   02/01/18 1542 02/01/18 1652  BP: (!) 157/81 (!) 150/86  Pulse: 90   Temp: 98.3 F (36.8 C)   TempSrc: Oral   SpO2: 94%  Weight: (!) 308 lb (139.7 kg)   Height: 5\' 7"  (1.702 m)           Assessment & Plan:   Imanii Gosdin is a 68 y.o. female Gastroesophageal reflux disease, esophagitis presence not specified - Plan: ranitidine (ZANTAC) 300 MG tablet  -Tolerating current regimen of ranitidine, with history as above.  Continue same dose.  Asthma, chronic, mild intermittent, uncomplicated - Plan: VENTOLIN HFA 108 (90 Base) MCG/ACT inhaler, budesonide-formoterol (SYMBICORT) 160-4.5 MCG/ACT inhaler  -Overall stable on discussion, but RTC precautions discussed if increased need of rescue inhaler.  -Continue Symbicort same dose  -Ventolin HFA requested given reported issues with alcohol propellant of Proventil.  Dispense as written.  Did discuss ProAir Respiclick as alternate delivery option but declined at this time.  Arthralgia, unspecified joint - Plan: lidocaine (XYLOCAINE) 5 % ointment Pain of both shoulder joints - Plan: lidocaine (XYLOCAINE) 5 % ointment Chronic back pain, unspecified back location, unspecified back pain laterality - Plan: lidocaine (XYLOCAINE) 5 % ointment  -Chronic pain, has had relief with intermittent dosing of lidocaine patch in the past.  Changed to lidocaine ointment at her request due to insurance coverage.  Attributes some elevated blood pressure in office due to pain.  Allergic rhinitis, unspecified seasonality, unspecified trigger - Plan: fluticasone (FLONASE) 50 MCG/ACT nasal spray  -Stable with use of Flonase, refill.  Meds ordered this encounter  Medications  . VENTOLIN HFA 108 (90 Base) MCG/ACT inhaler    Sig: INHALE 2 PUFFS INTO THE LUNGS EVERY 4 HOURS AS NEEDED FOR WHEEZING OR SHORTNESS OF BREATH    Dispense:  1 Inhaler    Refill:  5    Ventolin only - (not  proventil).  . budesonide-formoterol (SYMBICORT) 160-4.5 MCG/ACT inhaler    Sig: Inhale 2 puffs into the lungs 2 (two) times daily.    Dispense:  10.2 g    Refill:  11  . ranitidine (ZANTAC) 300 MG tablet    Sig: Take 1 tablet (300 mg total) by mouth 2 (two) times daily. As needed for heartburn/gerd    Dispense:  180 tablet    Refill:  3  . fluticasone (FLONASE) 50 MCG/ACT nasal spray    Sig: SHAKE LIQUID AND USE 2 SPRAYS IN EACH NOSTRIL AT BEDTIME    Dispense:  16 g    Refill:  11  . lidocaine (XYLOCAINE) 5 % ointment    Sig: Apply 1 application topically as needed.    Dispense:  35.44 g    Refill:  11   Patient Instructions    I will refill Ventolin at the same dose for now.  Continue Symbicort 2 puffs twice per day but if you do notice increased need for the Ventolin please follow-up so we can discuss other options.  Another option instead of ventolin would be proair respiclick.   Lidocaine gel prescribed for various joint pains.  He should have sufficient refills until follow-up in the next 6 to 8 months.  No change in dose of other medications.  Thank you for coming in today.       If you have lab work done today you will be contacted with your lab results within the next 2 weeks.  If you have not heard from [place] then please contact us. The fastest way to get your results is to register for My Chart.   IF you received an x-ray today, you will receive an invoice from [facility]. Please contact [facility] at [phone] with questions or  concerns regarding your invoice.   IF you received labwork today, you will receive an invoice from [place]. Please contact [facility] at [phone] with questions or concerns regarding your invoice.   Our billing staff will not be able to assist you with questions regarding bills from these companies.  You will be contacted with the lab results as soon as they are available. The fastest way to get your results is to  activate your My Chart account. Instructions are located on the last page of this paperwork. If you have not heard from [place] regarding the results in 2 weeks, please contact this office.       Signed,   Merri Ray, MD [facility].  02/03/18 10:53 PM

## 2018-02-01 NOTE — Patient Instructions (Addendum)
  I will refill Ventolin at the same dose for now.  Continue Symbicort 2 puffs twice per day but if you do notice increased need for the Ventolin please follow-up so we can discuss other options.  Another option instead of ventolin would be proair respiclick.   Lidocaine gel prescribed for various joint pains.  He should have sufficient refills until follow-up in the next 6 to 8 months.  No change in dose of other medications.  Thank you for coming in today.       If you have lab work done today you will be contacted with your lab results within the next 2 weeks.  If you have not heard from [place] then please contact us. The fastest way to get your results is to register for My Chart.   IF you received an x-ray today, you will receive an invoice from [facility]. Please contact [facility] at [phone] with questions or concerns regarding your invoice.   IF you received labwork today, you will receive an invoice from [place]. Please contact [facility] at [phone] with questions or concerns regarding your invoice.   Our billing staff will not be able to assist you with questions regarding bills from these companies.  You will be contacted with the lab results as soon as they are available. The fastest way to get your results is to activate your My Chart account. Instructions are located on the last page of this paperwork. If you have not heard from [place] regarding the results in 2 weeks, please contact this office.

## 2018-02-01 NOTE — Addendum Note (Signed)
Addended by: Delia Chimes A on: 02/01/2018 01:23 PM   Modules accepted: Orders

## 2018-02-01 NOTE — Telephone Encounter (Signed)
pls see note 

## 2018-02-03 ENCOUNTER — Encounter: Payer: Self-pay | Admitting: Family Medicine

## 2018-02-16 DIAGNOSIS — M7752 Other enthesopathy of left foot: Secondary | ICD-10-CM | POA: Diagnosis not present

## 2018-02-16 DIAGNOSIS — M25572 Pain in left ankle and joints of left foot: Secondary | ICD-10-CM | POA: Diagnosis not present

## 2018-03-16 DIAGNOSIS — M7752 Other enthesopathy of left foot: Secondary | ICD-10-CM | POA: Diagnosis not present

## 2018-03-16 DIAGNOSIS — M25572 Pain in left ankle and joints of left foot: Secondary | ICD-10-CM | POA: Diagnosis not present

## 2018-04-20 ENCOUNTER — Telehealth: Payer: Self-pay | Admitting: Internal Medicine

## 2018-04-20 MED ORDER — FAMOTIDINE 40 MG PO TABS: 40.0000 mg | ORAL_TABLET | Freq: Two times a day (BID) | ORAL | 3 refills | Status: DC

## 2018-04-20 NOTE — Telephone Encounter (Signed)
Patient would like to know what she should take since ranitidine has a recall and can no long take it

## 2018-04-20 NOTE — Telephone Encounter (Signed)
I have sent in the pepcid 40mg  to take BID as she was taking her ranitidine. I left her a detailed message that this has been sent in.

## 2018-06-16 ENCOUNTER — Other Ambulatory Visit: Payer: Self-pay | Admitting: Internal Medicine

## 2018-07-09 ENCOUNTER — Telehealth: Payer: Self-pay | Admitting: Specialist

## 2018-07-09 NOTE — Telephone Encounter (Signed)
Patient called needing to get copy of records and MRI. I advised she would need to sign release form and for her to contact the facility where she had the MRI to obtain those images.

## 2018-07-16 ENCOUNTER — Observation Stay (HOSPITAL_COMMUNITY)
Admission: EM | Admit: 2018-07-16 | Discharge: 2018-07-17 | Disposition: A | Discharge status: Home / Self Care | Payer: Medicare Other | Attending: Internal Medicine | Admitting: Internal Medicine

## 2018-07-16 ENCOUNTER — Emergency Department (HOSPITAL_COMMUNITY): Payer: Medicare Other

## 2018-07-16 ENCOUNTER — Other Ambulatory Visit: Payer: Self-pay

## 2018-07-16 ENCOUNTER — Encounter (HOSPITAL_COMMUNITY): Payer: Self-pay

## 2018-07-16 ENCOUNTER — Observation Stay (HOSPITAL_BASED_OUTPATIENT_CLINIC_OR_DEPARTMENT_OTHER): Payer: Medicare Other

## 2018-07-16 DIAGNOSIS — R079 Chest pain, unspecified: Secondary | ICD-10-CM | POA: Diagnosis not present

## 2018-07-16 DIAGNOSIS — R52 Pain, unspecified: Secondary | ICD-10-CM | POA: Diagnosis not present

## 2018-07-16 DIAGNOSIS — I160 Hypertensive urgency: Secondary | ICD-10-CM | POA: Diagnosis not present

## 2018-07-16 DIAGNOSIS — Z6841 Body Mass Index (BMI) 40.0 and over, adult: Secondary | ICD-10-CM | POA: Diagnosis not present

## 2018-07-16 DIAGNOSIS — E785 Hyperlipidemia, unspecified: Secondary | ICD-10-CM | POA: Diagnosis not present

## 2018-07-16 DIAGNOSIS — I169 Hypertensive crisis, unspecified: Secondary | ICD-10-CM

## 2018-07-16 DIAGNOSIS — K5730 Diverticulosis of large intestine without perforation or abscess without bleeding: Secondary | ICD-10-CM | POA: Diagnosis not present

## 2018-07-16 DIAGNOSIS — Z1159 Encounter for screening for other viral diseases: Secondary | ICD-10-CM | POA: Diagnosis not present

## 2018-07-16 DIAGNOSIS — J4540 Moderate persistent asthma, uncomplicated: Secondary | ICD-10-CM

## 2018-07-16 DIAGNOSIS — Z886 Allergy status to analgesic agent status: Secondary | ICD-10-CM | POA: Insufficient documentation

## 2018-07-16 DIAGNOSIS — Z7951 Long term (current) use of inhaled steroids: Secondary | ICD-10-CM | POA: Insufficient documentation

## 2018-07-16 DIAGNOSIS — M549 Dorsalgia, unspecified: Secondary | ICD-10-CM | POA: Diagnosis not present

## 2018-07-16 DIAGNOSIS — I7102 Dissection of abdominal aorta: Secondary | ICD-10-CM | POA: Diagnosis not present

## 2018-07-16 DIAGNOSIS — R112 Nausea with vomiting, unspecified: Secondary | ICD-10-CM

## 2018-07-16 DIAGNOSIS — Z8542 Personal history of malignant neoplasm of other parts of uterus: Secondary | ICD-10-CM | POA: Diagnosis not present

## 2018-07-16 DIAGNOSIS — K219 Gastro-esophageal reflux disease without esophagitis: Secondary | ICD-10-CM | POA: Diagnosis not present

## 2018-07-16 DIAGNOSIS — J45909 Unspecified asthma, uncomplicated: Secondary | ICD-10-CM | POA: Diagnosis not present

## 2018-07-16 DIAGNOSIS — Z79899 Other long term (current) drug therapy: Secondary | ICD-10-CM | POA: Insufficient documentation

## 2018-07-16 DIAGNOSIS — I10 Essential (primary) hypertension: Secondary | ICD-10-CM | POA: Insufficient documentation

## 2018-07-16 DIAGNOSIS — R000 Tachycardia, unspecified: Secondary | ICD-10-CM | POA: Diagnosis not present

## 2018-07-16 DIAGNOSIS — E782 Mixed hyperlipidemia: Secondary | ICD-10-CM

## 2018-07-16 DIAGNOSIS — M546 Pain in thoracic spine: Secondary | ICD-10-CM | POA: Diagnosis not present

## 2018-07-16 DIAGNOSIS — R0902 Hypoxemia: Secondary | ICD-10-CM | POA: Diagnosis not present

## 2018-07-16 DIAGNOSIS — I7101 Dissection of thoracic aorta: Secondary | ICD-10-CM | POA: Diagnosis not present

## 2018-07-16 DIAGNOSIS — M5489 Other dorsalgia: Secondary | ICD-10-CM | POA: Diagnosis not present

## 2018-07-16 LAB — POCT I-STAT EG7
Acid-Base Excess: 1 mmol/L (ref 0.0–2.0)
Bicarbonate: 29.5 mmol/L — ABNORMAL HIGH (ref 20.0–28.0)
Calcium, Ion: 1.19 mmol/L (ref 1.15–1.40)
HCT: 49 % — ABNORMAL HIGH (ref 36.0–46.0)
Hemoglobin: 16.7 g/dL — ABNORMAL HIGH (ref 12.0–15.0)
O2 Saturation: 51 %
Potassium: 4 mmol/L (ref 3.5–5.1)
Sodium: 143 mmol/L (ref 135–145)
TCO2: 31 mmol/L (ref 22–32)
pCO2, Ven: 60.9 mmHg — ABNORMAL HIGH (ref 44.0–60.0)
pH, Ven: 7.293 (ref 7.250–7.430)
pO2, Ven: 31 mmHg — CL (ref 32.0–45.0)

## 2018-07-16 LAB — COMPREHENSIVE METABOLIC PANEL
ALT: 24 U/L (ref 0–44)
AST: 23 U/L (ref 15–41)
Albumin: 3.9 g/dL (ref 3.5–5.0)
Alkaline Phosphatase: 67 U/L (ref 38–126)
Anion gap: 12 (ref 5–15)
BUN: 10 mg/dL (ref 8–23)
CO2: 25 mmol/L (ref 22–32)
Calcium: 8.8 mg/dL — ABNORMAL LOW (ref 8.9–10.3)
Chloride: 106 mmol/L (ref 98–111)
Creatinine, Ser: 0.81 mg/dL (ref 0.44–1.00)
GFR calc Af Amer: >60 mL/min (ref >60)
GFR calc non Af Amer: >60 mL/min (ref >60)
Glucose, Bld: 147 mg/dL — ABNORMAL HIGH (ref 70–99)
Potassium: 4 mmol/L (ref 3.5–5.1)
Sodium: 143 mmol/L (ref 135–145)
Total Bilirubin: 0.3 mg/dL (ref 0.3–1.2)
Total Protein: 7.5 g/dL (ref 6.5–8.1)

## 2018-07-16 LAB — CBC WITH DIFFERENTIAL/PLATELET
Abs Immature Granulocytes: 0.05 10*3/uL (ref 0.00–0.07)
Basophils Absolute: 0.1 10*3/uL (ref 0.0–0.1)
Basophils Relative: 1 %
Eosinophils Absolute: 0.1 10*3/uL (ref 0.0–0.5)
Eosinophils Relative: 1 %
HCT: 49 % — ABNORMAL HIGH (ref 36.0–46.0)
Hemoglobin: 15.3 g/dL — ABNORMAL HIGH (ref 12.0–15.0)
Immature Granulocytes: 1 %
Lymphocytes Relative: 15 %
Lymphs Abs: 1.2 10*3/uL (ref 0.7–4.0)
MCH: 28.5 pg (ref 26.0–34.0)
MCHC: 31.2 g/dL (ref 30.0–36.0)
MCV: 91.4 fL (ref 80.0–100.0)
Monocytes Absolute: 0.4 10*3/uL (ref 0.1–1.0)
Monocytes Relative: 5 %
Neutro Abs: 6.5 10*3/uL (ref 1.7–7.7)
Neutrophils Relative %: 77 %
Platelets: 240 10*3/uL (ref 150–400)
RBC: 5.36 MIL/uL — ABNORMAL HIGH (ref 3.87–5.11)
RDW: 14.6 % (ref 11.5–15.5)
WBC: 8.3 10*3/uL (ref 4.0–10.5)
nRBC: 0 % (ref 0.0–0.2)

## 2018-07-16 LAB — LIPID PANEL
Cholesterol: 152 mg/dL (ref 0–200)
HDL: 45 mg/dL (ref >40)
LDL Cholesterol: 94 mg/dL (ref 0–99)
Total CHOL/HDL Ratio: 3.4 RATIO
Triglycerides: 64 mg/dL (ref <150)
VLDL: 13 mg/dL (ref 0–40)

## 2018-07-16 LAB — I-STAT CHEM 8, ED
BUN: 10 mg/dL (ref 8–23)
Calcium, Ion: 1.18 mmol/L (ref 1.15–1.40)
Chloride: 105 mmol/L (ref 98–111)
Creatinine, Ser: 0.8 mg/dL (ref 0.44–1.00)
Glucose, Bld: 130 mg/dL — ABNORMAL HIGH (ref 70–99)
HCT: 50 % — ABNORMAL HIGH (ref 36.0–46.0)
Hemoglobin: 17 g/dL — ABNORMAL HIGH (ref 12.0–15.0)
Potassium: 4 mmol/L (ref 3.5–5.1)
Sodium: 144 mmol/L (ref 135–145)
TCO2: 29 mmol/L (ref 22–32)

## 2018-07-16 LAB — TROPONIN I (HIGH SENSITIVITY)
Troponin I (High Sensitivity): 6 ng/L (ref <18)
Troponin I (High Sensitivity): 12 ng/L (ref <18)
Troponin I (High Sensitivity): 13 ng/L (ref <18)
Troponin I (High Sensitivity): 12 ng/L (ref <18)
Troponin I (High Sensitivity): 12 ng/L (ref <18)

## 2018-07-16 LAB — ECHOCARDIOGRAM COMPLETE
Height: 69 in
Weight: 4720 oz

## 2018-07-16 LAB — RAPID URINE DRUG SCREEN, HOSP PERFORMED
Amphetamines: NOT DETECTED
Barbiturates: NOT DETECTED
Benzodiazepines: NOT DETECTED
Cocaine: NOT DETECTED
Opiates: POSITIVE — AB
Tetrahydrocannabinol: NOT DETECTED

## 2018-07-16 LAB — SARS CORONAVIRUS 2 BY RT PCR (HOSPITAL ORDER, PERFORMED IN ~~LOC~~ HOSPITAL LAB): SARS Coronavirus 2: NEGATIVE

## 2018-07-16 LAB — HEMOGLOBIN A1C
Hgb A1c MFr Bld: 5.8 % — ABNORMAL HIGH (ref 4.8–5.6)
Mean Plasma Glucose: 119.76 mg/dL

## 2018-07-16 LAB — LIPASE, BLOOD: Lipase: 23 U/L (ref 11–51)

## 2018-07-16 LAB — I-STAT CREATININE, ED: Creatinine, Ser: 0.9 mg/dL (ref 0.44–1.00)

## 2018-07-16 MED ORDER — MORPHINE SULFATE (PF) 2 MG/ML IV SOLN: 2.0000 mg | INTRAVENOUS | Status: DC | PRN

## 2018-07-16 MED ORDER — MOMETASONE FURO-FORMOTEROL FUM 200-5 MCG/ACT IN AERO
2.0000 | INHALATION_SPRAY | Freq: Two times a day (BID) | RESPIRATORY_TRACT | Status: DC
  Administered 2018-07-16 (×2): 2 via RESPIRATORY_TRACT
  Filled 2018-07-16: qty 8.8

## 2018-07-16 MED ORDER — AMLODIPINE BESYLATE 5 MG PO TABS
5.0000 mg | ORAL_TABLET | Freq: Every day | ORAL | Status: DC
  Administered 2018-07-16: 5 mg via ORAL
  Filled 2018-07-16: qty 1

## 2018-07-16 MED ORDER — PERFLUTREN LIPID MICROSPHERE
1.0000 mL | INTRAVENOUS | Status: AC | PRN
  Administered 2018-07-16: 4 mL via INTRAVENOUS
  Filled 2018-07-16: qty 10

## 2018-07-16 MED ORDER — ASPIRIN 81 MG PO CHEW
324.0000 mg | CHEWABLE_TABLET | Freq: Once | ORAL | Status: DC
  Filled 2018-07-16: qty 4

## 2018-07-16 MED ORDER — OXYCODONE-ACETAMINOPHEN 5-325 MG PO TABS
1.0000 | ORAL_TABLET | ORAL | Status: DC | PRN
  Administered 2018-07-16: 1 via ORAL
  Filled 2018-07-16: qty 1

## 2018-07-16 MED ORDER — HYDRALAZINE HCL 20 MG/ML IJ SOLN
5.0000 mg | Freq: Once | INTRAMUSCULAR | Status: AC
  Administered 2018-07-16: 5 mg via INTRAVENOUS
  Filled 2018-07-16: qty 1

## 2018-07-16 MED ORDER — FENTANYL CITRATE (PF) 100 MCG/2ML IJ SOLN
100.0000 ug | Freq: Once | INTRAMUSCULAR | Status: AC
  Administered 2018-07-16: 100 ug via INTRAVENOUS
  Filled 2018-07-16: qty 2

## 2018-07-16 MED ORDER — FLUTICASONE PROPIONATE 50 MCG/ACT NA SUSP
1.0000 | Freq: Every day | NASAL | Status: DC | PRN
  Filled 2018-07-16: qty 16

## 2018-07-16 MED ORDER — ALBUTEROL SULFATE (2.5 MG/3ML) 0.083% IN NEBU: 2.5000 mg | INHALATION_SOLUTION | RESPIRATORY_TRACT | Status: DC | PRN

## 2018-07-16 MED ORDER — HYDRALAZINE HCL 25 MG PO TABS: 25.0000 mg | ORAL_TABLET | Freq: Three times a day (TID) | ORAL | Status: DC

## 2018-07-16 MED ORDER — HYDRALAZINE HCL 20 MG/ML IJ SOLN
5.0000 mg | INTRAMUSCULAR | Status: DC | PRN
  Administered 2018-07-16: 5 mg via INTRAVENOUS
  Filled 2018-07-16 (×2): qty 1

## 2018-07-16 MED ORDER — SODIUM CHLORIDE (PF) 0.9 % IJ SOLN
INTRAMUSCULAR | Status: AC
  Administered 2018-07-16: 05:00:00
  Filled 2018-07-16: qty 50

## 2018-07-16 MED ORDER — TURMERIC 400 MG PO CAPS: 400.0000 mg | ORAL_CAPSULE | Freq: Every day | ORAL | Status: DC

## 2018-07-16 MED ORDER — ONDANSETRON HCL 4 MG/2ML IJ SOLN: 4.0000 mg | Freq: Four times a day (QID) | INTRAMUSCULAR | Status: DC | PRN

## 2018-07-16 MED ORDER — IOHEXOL 350 MG/ML SOLN
100.0000 mL | Freq: Once | INTRAVENOUS | Status: AC | PRN
  Administered 2018-07-16: 100 mL via INTRAVENOUS

## 2018-07-16 MED ORDER — HYDROMORPHONE HCL 1 MG/ML IJ SOLN
1.0000 mg | Freq: Once | INTRAMUSCULAR | Status: AC
  Administered 2018-07-16: 1 mg via INTRAVENOUS
  Filled 2018-07-16: qty 1

## 2018-07-16 MED ORDER — NITROGLYCERIN 0.4 MG SL SUBL: 0.4000 mg | SUBLINGUAL_TABLET | SUBLINGUAL | Status: DC | PRN

## 2018-07-16 MED ORDER — ENOXAPARIN SODIUM 40 MG/0.4ML ~~LOC~~ SOLN: 40.0000 mg | SUBCUTANEOUS | Status: DC

## 2018-07-16 MED ORDER — ALBUTEROL SULFATE HFA 108 (90 BASE) MCG/ACT IN AERS
2.0000 | INHALATION_SPRAY | RESPIRATORY_TRACT | Status: DC | PRN
  Filled 2018-07-16: qty 6.7

## 2018-07-16 MED ORDER — ACETAMINOPHEN 325 MG PO TABS: 650.0000 mg | ORAL_TABLET | ORAL | Status: DC | PRN

## 2018-07-16 MED ORDER — HYPROMELLOSE (GONIOSCOPIC) 2.5 % OP SOLN: 1.0000 [drp] | Freq: Three times a day (TID) | OPHTHALMIC | Status: DC | PRN

## 2018-07-16 MED ORDER — METHOCARBAMOL 500 MG PO TABS
500.0000 mg | ORAL_TABLET | Freq: Three times a day (TID) | ORAL | Status: DC | PRN
  Administered 2018-07-16: 500 mg via ORAL
  Filled 2018-07-16: qty 1

## 2018-07-16 MED ORDER — ENOXAPARIN SODIUM 30 MG/0.3ML ~~LOC~~ SOLN
30.0000 mg | SUBCUTANEOUS | Status: DC
  Administered 2018-07-16: 11:00:00 30 mg via SUBCUTANEOUS
  Filled 2018-07-16: qty 0.3

## 2018-07-16 MED ORDER — PROCHLORPERAZINE EDISYLATE 10 MG/2ML IJ SOLN
10.0000 mg | Freq: Once | INTRAMUSCULAR | Status: AC
  Administered 2018-07-16: 10 mg via INTRAVENOUS
  Filled 2018-07-16: qty 2

## 2018-07-16 MED ORDER — FAMOTIDINE 20 MG PO TABS
40.0000 mg | ORAL_TABLET | Freq: Two times a day (BID) | ORAL | Status: DC
  Administered 2018-07-16 (×2): 40 mg via ORAL
  Filled 2018-07-16 (×2): qty 2

## 2018-07-16 MED ORDER — POLYVINYL ALCOHOL 1.4 % OP SOLN
1.0000 [drp] | Freq: Three times a day (TID) | OPHTHALMIC | Status: DC | PRN
  Administered 2018-07-16: 11:00:00 1 [drp] via OPHTHALMIC
  Filled 2018-07-16: qty 15

## 2018-07-16 MED ORDER — HYDRALAZINE HCL 25 MG PO TABS
25.0000 mg | ORAL_TABLET | Freq: Three times a day (TID) | ORAL | Status: DC
  Administered 2018-07-16 – 2018-07-17 (×3): 25 mg via ORAL
  Filled 2018-07-16 (×2): qty 1

## 2018-07-16 NOTE — ED Notes (Signed)
Patient updated on POC. Troponin drawn. VSS. Patient resting quietly.

## 2018-07-16 NOTE — Progress Notes (Signed)
[facility]  PROGRESS NOTE    Sheila Tucker  XBM:[id number] DOB: [birth year] DOA: 07/16/2018 PCP: Shawnee Knapp, MD   Brief Narrative:   Sheila Tucker is a 68 y.o. female with medical history significant of hypertension (patient denies this diagnosis), hyperlipidemia, asthma, GERD, lymphocytic colitis, remote uterine cancer (s/p of hysterectomy, no radiation or chemotherapy), who presents with back pain or chest pain.  Patient states that she started having acute onset back pain at about 9:30 PM, which is located in the upper back between shoulder blades, constant, severe, sharp, 10 out of 10 in severity, radiating to the left side of the chest.  The chest pain is 8 out of 10 in severity initially, currently 3 out of 10 in severity, sharp, pleuritic, aggravated by deep breaths.  Patient denies cough or shortness of breath.  No fever or chills.  Patient has diaphoresis per report.  She has vomited once with nonbiliary nonbloody vomitus.  Currently no nausea, vomiting, diarrhea or abdominal pain.  Denies symptoms of UTI or unilateral weakness.  ED Course: pt was found to have high sensitive troponin 6.0, WBC 8.3, negative COVID-19 test, electrolytes renal function okay, temperature normal, blood pressure 196/107, tachycardia, oxygen sat 93 to 94% on room air, temperature normal.  Chest x-ray negative.  CT angiogram is negative for dissection.  Patient is placed on telemetry bed for observation.   Assessment & Plan:   Principal Problem:   Chest pain Active Problems:   HYPERLIPIDEMIA   Asthma   Hypertensive urgency   Back pain   Chest pain     - Etiology is not clear.       - CT angiogram is negative for dissection.       - Initial troponin negative.       - Chest x-ray negative.       - No fever or leukocytosis for pneumonia.       - COVID-19 negative.       - Blood pressure is elevated 196/107, indicating possible demand ischemia.     - Tele bed for obs     - prn Nitroglycerin, Morphine; pt can not  tolerate ASA     - cards consulted; appreciate assistance, echo ordered  HLD     - not taking meds     - [place] 152, HDL 45, LDL 94  Asthma     - stable     - Dulera, albuterol inhaler  Hypertensive urgency     - Patient states that she does not have history of hypertension though HTN is on her med list.  She states that her blood      - at admission, her blood pressure was 196/107.     - remains persistently elevated; start amlodipine, hydralazine  Back pain     - Etiology is not clear     - CT angiogram is negative for dissection.       - Possibly due to musculoskeletal pain or muscle straining.     - PRN Tylenol, percocet, Robaxin     - denies complaints this morning  Echo pending per cards. If negative, they will arrange for outpt stress test. BP still uncontrolled. Adding norvasc, hydralazine. Let's see those come down before we consider discharge.    DVT prophylaxis: lovenox Code Status: FULL   Disposition Plan: TBD   Consultants:   Cardiology   Subjective: "This always happens when I come to the hospital. It's never high when I'm at home."  Objective:  Vitals:   07/16/18 1000 07/16/18 1030 07/16/18 1100 07/16/18 1130  BP: (!) 173/94 (!) 175/105 (!) 198/109 (!) 174/99  Pulse: 78 85 84 81  Resp: 15 19 18 18   Temp:      TempSrc:      SpO2: 96% 100% 99% 100%  Weight:      Height:       No intake or output data in the 24 hours ending 07/16/18 1201 Filed Weights   07/16/18 0325  Weight: 133.8 kg    Examination:  General: 68 y.o. female resting in bed in NAD Cardiovascular: RRR, +S1, S2, no m/g/r, equal pulses throughout Respiratory: CTABL, no w/r/r, normal WOB GI: BS+, NDNT, no masses noted, no organomegaly noted, obese MSK: No e/c/c Skin: No rashes, bruises, ulcerations noted Neuro: A&O x 3, no focal deficits Psyc: Appropriate interaction and affect, calm/cooperative    Data Reviewed: I have personally reviewed following labs and imaging  studies.  CBC: Recent Labs  Lab 07/16/18 0335 07/16/18 0348 07/16/18 0349  WBC 8.3  --   --   NEUTROABS 6.5  --   --   HGB 15.3* 16.7* 17.0*  HCT 49.0* 49.0* 50.0*  MCV 91.4  --   --   PLT 240  --   --    Basic Metabolic Panel: Recent Labs  Lab 07/16/18 0335 07/16/18 0348 07/16/18 0349  NA 143 143 144  K 4.0 4.0 4.0  CL 106  --  105  CO2 25  --   --   GLUCOSE 147*  --  130*  BUN 10  --  10  CREATININE 0.81  --  0.90  0.80  CALCIUM 8.8*  --   --    GFR: Estimated Creatinine Clearance: 86.8 mL/min (by C-G formula based on SCr of 0.9 mg/dL). Liver Function Tests: Recent Labs  Lab 07/16/18 0335  AST 23  ALT 24  ALKPHOS 67  BILITOT 0.3  PROT 7.5  ALBUMIN 3.9   Recent Labs  Lab 07/16/18 0335  LIPASE 23   No results for input(s): AMMONIA in the last 168 hours. Coagulation Profile: No results for input(s): INR, PROTIME in the last 168 hours. Cardiac Enzymes: No results for input(s): CKTOTAL, CKMB, CKMBINDEX, TROPONINI in the last 168 hours. BNP (last 3 results) No results for input(s): PROBNP in the last 8760 hours. HbA1C: No results for input(s): HGBA1C in the last 72 hours. CBG: No results for input(s): GLUCAP in the last 168 hours. Lipid Profile: Recent Labs    07/16/18 0927  CHOL 152  HDL 45  LDLCALC 94  TRIG 64  CHOLHDL 3.4   Thyroid Function Tests: No results for input(s): TSH, T4TOTAL, FREET4, T3FREE, THYROIDAB in the last 72 hours. Anemia Panel: No results for input(s): VITAMINB12, FOLATE, FERRITIN, TIBC, IRON, RETICCTPCT in the last 72 hours. Sepsis Labs: No results for input(s): PROCALCITON, LATICACIDVEN in the last 168 hours.  Recent Results (from the past 240 hour(s))  SARS Coronavirus 2 (CEPHEID - Performed in [place] hospital lab), Hosp Order     Status: None   Collection Time: 07/16/18  3:52 AM   Specimen: Nasopharyngeal Swab  Result Value Ref Range Status   SARS Coronavirus 2 NEGATIVE NEGATIVE Final    Comment: (NOTE) If  result is NEGATIVE SARS-CoV-2 target nucleic acids are NOT DETECTED. The SARS-CoV-2 RNA is generally detectable in upper and lower  respiratory specimens during the acute phase of infection. The lowest  concentration of SARS-CoV-2 viral copies this assay can  detect is 250  copies / mL. A negative result does not preclude SARS-CoV-2 infection  and should not be used as the sole basis for treatment or other  patient management decisions.  A negative result may occur with  improper specimen collection / handling, submission of specimen other  than nasopharyngeal swab, presence of viral mutation(s) within the  areas targeted by this assay, and inadequate number of viral copies  (<250 copies / mL). A negative result must be combined with clinical  observations, patient history, and epidemiological information. If result is POSITIVE SARS-CoV-2 target nucleic acids are DETECTED. The SARS-CoV-2 RNA is generally detectable in upper and lower  respiratory specimens dur ing the acute phase of infection.  Positive  results are indicative of active infection with SARS-CoV-2.  Clinical  correlation with patient history and other diagnostic information is  necessary to determine patient infection status.  Positive results do  not rule out bacterial infection or co-infection with other viruses. If result is PRESUMPTIVE POSTIVE SARS-CoV-2 nucleic acids MAY BE PRESENT.   A presumptive positive result was obtained on the submitted specimen  and confirmed on repeat testing.  While 2019 novel coronavirus  (SARS-CoV-2) nucleic acids may be present in the submitted sample  additional confirmatory testing may be necessary for epidemiological  and / or clinical management purposes  to differentiate between  SARS-CoV-2 and other Sarbecovirus currently known to infect humans.  If clinically indicated additional testing with an alternate test  methodology [phone]) is advised. The SARS-CoV-2 RNA is generally   detectable in upper and lower respiratory sp ecimens during the acute  phase of infection. The expected result is Negative. Fact Sheet for Patients:  StrictlyIdeas.no Fact Sheet for Healthcare Providers: BankingDealers.co.za This test is not yet approved or cleared by the [place] FDA and has been authorized for detection and/or diagnosis of SARS-CoV-2 by FDA under an Emergency Use Authorization (EUA).  This EUA will remain in effect (meaning this test can be used) for the duration of the COVID-19 declaration under Section 564(b)(1) of the Act, 21 U.S.C. section 360bbb-3(b)(1), unless the authorization is terminated or revoked sooner. Performed at [facility], [place] [address] [place], [place] [address]          Radiology Studies: Dg Chest Portable 1 View  Result Date: 07/16/2018 CLINICAL DATA:  68 year old female with pain radiating from the chest to between the shoulder blades since 20/2 100 hours. Diaphoresis. EXAM: PORTABLE CHEST 1 VIEW COMPARISON:  Chest radiographs 03/23/2016 and earlier. FINDINGS: Portable AP semi upright view at 0341 hours. Mediastinal contours are stable and within normal limits. Visualized tracheal air column is within normal limits. Stable lung volumes. Allowing for portable technique the lungs are clear. No pneumothorax. Degenerative changes at both shoulders. No acute osseous abnormality identified. Paucity of bowel gas in the upper abdomen. IMPRESSION: No acute cardiopulmonary abnormality. Electronically Signed   By: Genevie Ann M.D.   On: 07/16/2018 03:57   Ct Angio Chest/abd/pel For Dissection W And/or Wo Contrast  Result Date: 07/16/2018 CLINICAL DATA:  "Aortic dissection, known, follow-up". Chart history of back pain extending between the shoulder blades. EXAM: CT ANGIOGRAPHY CHEST, ABDOMEN AND PELVIS TECHNIQUE: Multidetector CT imaging through the chest, abdomen and pelvis was  performed using the standard protocol during bolus administration of intravenous contrast. Multiplanar reconstructed images and MIPs were obtained and reviewed to evaluate the vascular anatomy. CONTRAST:  120mL OMNIPAQUE IOHEXOL 350 MG/ML SOLN COMPARISON:  None. FINDINGS: CTA CHEST FINDINGS Cardiovascular: Noncontrast phase  shows no intramural hematoma. Normal heart size. Negative for aortic dissection flap. Four vessel arch branching without superimposed abnormality. Enhancing proximal pulmonary arteries. Mediastinum/Nodes: Negative for hematoma or pneumomediastinum Lungs/Pleura: The central airways are clear. Mild atelectasis or scarring in the lower lungs. There is no edema, consolidation, effusion, or pneumothorax. Musculoskeletal: Prominent spondylosis with multi-level ankylosis. Advanced glenohumeral osteoarthritis. Review of the MIP images confirms the above findings. CTA ABDOMEN AND PELVIS FINDINGS VASCULAR Aorta: Mild atheromatous plaque.  No aneurysm or dissection. Celiac: Smooth and widely patent branches. SMA: Smooth and widely patent branches Renals: Single bilateral renal artery. Asymmetric less dense left main renal artery is attributed to streak artifact - downstream vessels are symmetrically enhancing and so is the renal parenchyma. There is no history of flank pain. Patient was scanned with the arms down. IMA: Patent Inflow: Mild atherosclerotic plaque without dissection flap or aneurysm. Veins: Unremarkable in the arterial phase Review of the MIP images confirms the above findings. NON-VASCULAR Hepatobiliary: Cystic density in the deep left lobe liver.No evidence of biliary obstruction or stone. Pancreas: Generalized fatty infiltration Spleen: Unremarkable. Adrenals/Urinary Tract: Negative adrenals. No hydronephrosis or detectable stone (there is early contrast excretion from test bolus). Unremarkable bladder. Stomach/Bowel: No obstruction. Sigmoid diverticulosis. Appendectomy. Lymphatic: No mass  or adenopathy. Reproductive:Hysterectomy. Other: No ascites or pneumoperitoneum. Musculoskeletal: Spondylosis and disc degeneration with levoscoliosis. Review of the MIP images confirms the above findings. IMPRESSION: 1. Negative for aortic dissection or other acute finding. 2. Overall mild atherosclerosis. Electronically Signed   By: Monte Fantasia M.D.   On: 07/16/2018 04:32        Scheduled Meds: . amLODipine  5 mg Oral Daily  . enoxaparin (LOVENOX) injection  30 mg Subcutaneous Q24H  . famotidine  40 mg Oral BID  . hydrALAZINE  25 mg Oral Q8H  . mometasone-formoterol  2 puff Inhalation BID   Continuous Infusions:   LOS: 0 days    Time spent: 25 minutes spent in the coordination of care today.    Jonnie Finner, DO [facility] Pager [phone]  If 7PM-7AM, please contact night-coverage [email or web address] Password [facility] 07/16/2018, 12:01 PM

## 2018-07-16 NOTE — Progress Notes (Signed)
PHARMACIST - PHYSICIAN ORDER COMMUNICATION  CONCERNING: P&T Medication Policy on Herbal Medications  DESCRIPTION:  This patient's order for:  tumeric  has been noted.  This product(s) is classified as an "herbal" or natural product. Due to a lack of definitive safety studies or FDA approval, nonstandard manufacturing practices, plus the potential risk of unknown drug-drug interactions while on inpatient medications, the Pharmacy and Therapeutics Committee does not permit the use of "herbal" or natural products of this type within [place].   ACTION TAKEN: The pharmacy department is unable to verify this order at this time and your patient has been informed of this safety policy. Please reevaluate patient's clinical condition at discharge and address if the herbal or natural product(s) should be resumed at that time.   

## 2018-07-16 NOTE — ED Notes (Signed)
IP PA at bedside.

## 2018-07-16 NOTE — H&P (Signed)
History and Physical    Sheila Tucker PFX:[id number] DOB: [birth year] DOA: 07/16/2018  Referring MD/NP/PA:   PCP: Shawnee Knapp, MD   Patient coming from:  The patient is coming from home.  At baseline, pt is independent for most of ADL.        Chief Complaint: back pain and chest pain  HPI: Sheila Tucker is a 68 y.o. female with medical history significant of hypertension (patient denies this diagnosis), hyperlipidemia, asthma, GERD, lymphocytic colitis, remote uterine cancer (s/p of hysterectomy, no radiation or chemotherapy), who presents with back pain or chest pain.  Patient states that she started having acute onset back pain at about 9:30 PM, which is located in the upper back between shoulder blades, constant, severe, sharp, 10 out of 10 in severity, radiating to the left side of the chest.  The chest pain is 8 out of 10 in severity initially, currently 3 out of 10 in severity, sharp, pleuritic, aggravated by deep breaths.  Patient denies cough or shortness of breath.  No fever or chills.  Patient has diaphoresis per report.  She has vomited once with nonbiliary nonbloody vomitus.  Currently no nausea, vomiting, diarrhea or abdominal pain.  Denies symptoms of UTI or unilateral weakness.  ED Course: pt was found to have high sensitive troponin 6.0, WBC 8.3, negative COVID-19 test, electrolytes renal function okay, temperature normal, blood pressure 196/107, tachycardia, oxygen sat 93 to 94% on room air, temperature normal.  Chest x-ray negative.  CT angiogram is negative for dissection.  Patient is placed on telemetry bed for observation.  Review of Systems:   General: no fevers, chills, no body weight gain, has fatigue HEENT: no blurry vision, hearing changes or sore throat Respiratory: no dyspnea, coughing, wheezing CV: has chest pain, no palpitations GI: has nausea, vomiting, no abdominal pain, diarrhea, constipation GU: no dysuria, burning on urination, increased urinary frequency,  hematuria  Ext: no leg edema Neuro: no unilateral weakness, numbness, or tingling, no vision change or hearing loss Skin: no rash, no skin tear. MSK: has back pain Heme: No easy bruising.  Travel history: No recent long distant travel.  Allergy:  Allergies  Allergen Reactions   Tetanus Toxoid     REACTION: FEVER AND SWELLING   Asa [Aspirin]    Nsaids Diarrhea    Past Medical History:  Diagnosis Date   Allergy    Asthma    Cancer ([employer])    uterine   Cataract    Diverticulosis    GERD (gastroesophageal reflux disease)    Hyperlipidemia    patient denies high lipids   Hypertension    past, under control, no meds   Lymphocytic colitis    Tubular adenoma of colon     Past Surgical History:  Procedure Laterality Date   ABDOMINAL HYSTERECTOMY  2003   & BSO for uterine cancer   APPENDECTOMY     COLONOSCOPY     tics and polyps (tubular adenomas), Neg in 2008   SHOULDER SURGERY  2004/2006   both shoulders, Dr Alphonzo Cruise   TONSILLECTOMY     UPPER GASTROINTESTINAL ENDOSCOPY  2003   esophagitis, gastritis, duodenitis    Social History:  reports that she has never smoked. She has never used smokeless tobacco. She reports that she does not drink alcohol or use drugs.  Family History:  Family History  Problem Relation Age of Onset   Heart attack Father 71       Congential Valvular Heart disease   Colon cancer  Paternal Grandfather    Colon cancer Maternal Grandfather    Breast cancer Paternal Aunt    Diabetes Paternal Aunt    Colon cancer Paternal Uncle        X 2   Stroke Neg Hx    Stomach cancer Neg Hx      Prior to Admission medications   Medication Sig Start Date End Date Taking? Authorizing Provider  budesonide-formoterol (SYMBICORT) 160-4.5 MCG/ACT inhaler Inhale 2 puffs into the lungs 2 (two) times daily. 02/01/18  Yes Wendie Agreste, MD  famotidine (PEPCID) 40 MG tablet Take 1 tablet (40 mg total) by mouth 2 (two) times daily.  04/20/18  Yes Pyrtle, Lajuan Lines, MD  fluticasone (FLONASE) 50 MCG/ACT nasal spray SHAKE LIQUID AND USE 2 SPRAYS IN EACH NOSTRIL AT BEDTIME Patient taking differently: Place 1 spray into both nostrils daily as needed for allergies or rhinitis.  02/01/18  Yes Wendie Agreste, MD  hydroxypropyl methylcellulose / hypromellose (ISOPTO TEARS / GONIOVISC) 2.5 % ophthalmic solution Place 1 drop into both eyes 3 (three) times daily as needed for dry eyes.   Yes [provider]  Turmeric 400 MG CAPS Take 400 mg by mouth daily.   Yes [provider]  VENTOLIN HFA 108 (90 Base) MCG/ACT inhaler INHALE 2 PUFFS INTO THE LUNGS EVERY 4 HOURS AS NEEDED FOR WHEEZING OR SHORTNESS OF BREATH Patient taking differently: Inhale 2 puffs into the lungs every 4 (four) hours as needed for wheezing or shortness of breath.  02/01/18  Yes Wendie Agreste, MD  lidocaine (XYLOCAINE) 5 % ointment Apply 1 application topically as needed. Patient not taking: Reported on 07/16/2018 02/01/18   Wendie Agreste, MD    Physical Exam: Vitals:   07/16/18 0530 07/16/18 0600 07/16/18 0630 07/16/18 0700  BP: (!) 178/94 (!) 170/92 (!) 164/91 (!) 175/93  Pulse: 99 88 88 87  Resp: 20 18 15 17   Temp:      TempSrc:      SpO2: 95% 97% 98% 99%  Weight:      Height:       General: Not in acute distress HEENT:       Eyes: PERRL, EOMI, no scleral icterus.       ENT: No discharge from the ears and nose, no pharynx injection, no tonsillar enlargement.        Neck: No JVD, no bruit, no mass felt. Heme: No neck lymph node enlargement. Cardiac: S1/S2, RRR, No murmurs, No gallops or rubs. Respiratory: No rales, wheezing, rhonchi or rubs. GI: Soft, nondistended, nontender, no rebound pain, no organomegaly, BS present. GU: No hematuria Ext: No pitting leg edema bilaterally. 2+DP/PT pulse bilaterally. Musculoskeletal: No joint deformities, No joint redness or warmth, no limitation of ROM in spin. Has tenderness in upper back,   midline and right paraspinal muscle area. Skin: No rashes.  Neuro: Alert, oriented X3, cranial nerves II-XII grossly intact, moves all extremities normally.  Psych: Patient is not psychotic, no suicidal or hemocidal ideation.  Labs on Admission: I have personally reviewed following labs and imaging studies  CBC: Recent Labs  Lab 07/16/18 0335 07/16/18 0348 07/16/18 0349  WBC 8.3  --   --   NEUTROABS 6.5  --   --   HGB 15.3* 16.7* 17.0*  HCT 49.0* 49.0* 50.0*  MCV 91.4  --   --   PLT 240  --   --    Basic Metabolic Panel: Recent Labs  Lab 07/16/18 0335 07/16/18 0348 07/16/18  0349  NA 143 143 144  K 4.0 4.0 4.0  CL 106  --  105  CO2 25  --   --   GLUCOSE 147*  --  130*  BUN 10  --  10  CREATININE 0.81  --  0.90   0.80  CALCIUM 8.8*  --   --    GFR: Estimated Creatinine Clearance: 86.8 mL/min (by C-G formula based on SCr of 0.9 mg/dL). Liver Function Tests: Recent Labs  Lab 07/16/18 0335  AST 23  ALT 24  ALKPHOS 67  BILITOT 0.3  PROT 7.5  ALBUMIN 3.9   Recent Labs  Lab 07/16/18 0335  LIPASE 23   No results for input(s): AMMONIA in the last 168 hours. Coagulation Profile: No results for input(s): INR, PROTIME in the last 168 hours. Cardiac Enzymes: No results for input(s): CKTOTAL, CKMB, CKMBINDEX, TROPONINI in the last 168 hours. BNP (last 3 results) No results for input(s): PROBNP in the last 8760 hours. HbA1C: No results for input(s): HGBA1C in the last 72 hours. CBG: No results for input(s): GLUCAP in the last 168 hours. Lipid Profile: No results for input(s): CHOL, HDL, LDLCALC, TRIG, CHOLHDL, LDLDIRECT in the last 72 hours. Thyroid Function Tests: No results for input(s): TSH, T4TOTAL, FREET4, T3FREE, THYROIDAB in the last 72 hours. Anemia Panel: No results for input(s): VITAMINB12, FOLATE, FERRITIN, TIBC, IRON, RETICCTPCT in the last 72 hours. Urine analysis:    Component Value Date/Time   BILIRUBINUR negative 05/30/2017 1535   BILIRUBINUR  small 06/08/2016 1429   KETONESUR negative 05/30/2017 1535   PROTEINUR negative 05/30/2017 1535   PROTEINUR 100 06/08/2016 1429   UROBILINOGEN 0.2 05/30/2017 1535   NITRITE Negative 05/30/2017 1535   NITRITE positive 06/08/2016 1429   LEUKOCYTESUR Large (3+) (A) 05/30/2017 1535   Sepsis Labs: @LABRCNTIP (procalcitonin:4,lacticidven:4) ) Recent Results (from the past 240 hour(s))  SARS Coronavirus 2 (CEPHEID - Performed in [facility] hospital lab), Hosp Order     Status: None   Collection Time: 07/16/18  3:52 AM   Specimen: Nasopharyngeal Swab  Result Value Ref Range Status   SARS Coronavirus 2 NEGATIVE NEGATIVE Final    Comment: (NOTE) If result is NEGATIVE SARS-CoV-2 target nucleic acids are NOT DETECTED. The SARS-CoV-2 RNA is generally detectable in upper and lower  respiratory specimens during the acute phase of infection. The lowest  concentration of SARS-CoV-2 viral copies this assay can detect is 250  copies / mL. A negative result does not preclude SARS-CoV-2 infection  and should not be used as the sole basis for treatment or other  patient management decisions.  A negative result may occur with  improper specimen collection / handling, submission of specimen other  than nasopharyngeal swab, presence of viral mutation(s) within the  areas targeted by this assay, and inadequate number of viral copies  (<250 copies / mL). A negative result must be combined with clinical  observations, patient history, and epidemiological information. If result is POSITIVE SARS-CoV-2 target nucleic acids are DETECTED. The SARS-CoV-2 RNA is generally detectable in upper and lower  respiratory specimens dur ing the acute phase of infection.  Positive  results are indicative of active infection with SARS-CoV-2.  Clinical  correlation with patient history and other diagnostic information is  necessary to determine patient infection status.  Positive results do  not rule out bacterial  infection or co-infection with other viruses. If result is PRESUMPTIVE POSTIVE SARS-CoV-2 nucleic acids MAY BE PRESENT.   A presumptive positive result was obtained  on the submitted specimen  and confirmed on repeat testing.  While 2019 novel coronavirus  (SARS-CoV-2) nucleic acids may be present in the submitted sample  additional confirmatory testing may be necessary for epidemiological  and / or clinical management purposes  to differentiate between  SARS-CoV-2 and other Sarbecovirus currently known to infect humans.  If clinically indicated additional testing with an alternate test  methodology [id number]) is advised. The SARS-CoV-2 RNA is generally  detectable in upper and lower respiratory sp ecimens during the acute  phase of infection. The expected result is Negative. Fact Sheet for Patients:  StrictlyIdeas.no Fact Sheet for Healthcare Providers: BankingDealers.co.za This test is not yet approved or cleared by the [place] FDA and has been authorized for detection and/or diagnosis of SARS-CoV-2 by FDA under an Emergency Use Authorization (EUA).  This EUA will remain in effect (meaning this test can be used) for the duration of the COVID-19 declaration under Section 564(b)(1) of the Act, 21 U.S.C. section 360bbb-3(b)(1), unless the authorization is terminated or revoked sooner. Performed at [facility], [place] [address] [place], [place] [address]      Radiological Exams on Admission: Dg Chest Portable 1 View  Result Date: 07/16/2018 CLINICAL DATA:  68 year old female with pain radiating from the chest to between the shoulder blades since 20/2 100 hours. Diaphoresis. EXAM: PORTABLE CHEST 1 VIEW COMPARISON:  Chest radiographs 03/23/2016 and earlier. FINDINGS: Portable AP semi upright view at 0341 hours. Mediastinal contours are stable and within normal limits. Visualized tracheal air column is within normal  limits. Stable lung volumes. Allowing for portable technique the lungs are clear. No pneumothorax. Degenerative changes at both shoulders. No acute osseous abnormality identified. Paucity of bowel gas in the upper abdomen. IMPRESSION: No acute cardiopulmonary abnormality. Electronically Signed   By: Genevie Ann M.D.   On: 07/16/2018 03:57   Ct Angio Chest/abd/pel For Dissection W And/or Wo Contrast  Result Date: 07/16/2018 CLINICAL DATA:  "Aortic dissection, known, follow-up". Chart history of back pain extending between the shoulder blades. EXAM: CT ANGIOGRAPHY CHEST, ABDOMEN AND PELVIS TECHNIQUE: Multidetector CT imaging through the chest, abdomen and pelvis was performed using the standard protocol during bolus administration of intravenous contrast. Multiplanar reconstructed images and MIPs were obtained and reviewed to evaluate the vascular anatomy. CONTRAST:  137mL OMNIPAQUE IOHEXOL 350 MG/ML SOLN COMPARISON:  None. FINDINGS: CTA CHEST FINDINGS Cardiovascular: Noncontrast phase shows no intramural hematoma. Normal heart size. Negative for aortic dissection flap. Four vessel arch branching without superimposed abnormality. Enhancing proximal pulmonary arteries. Mediastinum/Nodes: Negative for hematoma or pneumomediastinum Lungs/Pleura: The central airways are clear. Mild atelectasis or scarring in the lower lungs. There is no edema, consolidation, effusion, or pneumothorax. Musculoskeletal: Prominent spondylosis with multi-level ankylosis. Advanced glenohumeral osteoarthritis. Review of the MIP images confirms the above findings. CTA ABDOMEN AND PELVIS FINDINGS VASCULAR Aorta: Mild atheromatous plaque.  No aneurysm or dissection. Celiac: Smooth and widely patent branches. SMA: Smooth and widely patent branches Renals: Single bilateral renal artery. Asymmetric less dense left main renal artery is attributed to streak artifact - downstream vessels are symmetrically enhancing and so is the renal parenchyma. There  is no history of flank pain. Patient was scanned with the arms down. IMA: Patent Inflow: Mild atherosclerotic plaque without dissection flap or aneurysm. Veins: Unremarkable in the arterial phase Review of the MIP images confirms the above findings. NON-VASCULAR Hepatobiliary: Cystic density in the deep left lobe liver.No evidence of biliary obstruction or stone. Pancreas: Generalized fatty infiltration Spleen: Unremarkable. Adrenals/Urinary  Tract: Negative adrenals. No hydronephrosis or detectable stone (there is early contrast excretion from test bolus). Unremarkable bladder. Stomach/Bowel: No obstruction. Sigmoid diverticulosis. Appendectomy. Lymphatic: No mass or adenopathy. Reproductive:Hysterectomy. Other: No ascites or pneumoperitoneum. Musculoskeletal: Spondylosis and disc degeneration with levoscoliosis. Review of the MIP images confirms the above findings. IMPRESSION: 1. Negative for aortic dissection or other acute finding. 2. Overall mild atherosclerosis. Electronically Signed   By: Monte Fantasia M.D.   On: 07/16/2018 04:32     EKG: Independently reviewed.  Sinus rhythm, QTC 464, LAE, low-grade, nonspecific T wave change.  Assessment/Plan Principal Problem:   Chest pain Active Problems:   HYPERLIPIDEMIA   Asthma   Hypertensive urgency   Back pain   Chest pain: Etiology is not clear.  CT angiogram is negative for dissection.  Initial troponin negative.  Chest x-ray negative.  No fever or leukocytosis for pneumonia.  COVID-19 negative.  Blood pressure is elevated 196/107, indicating possible demand ischemia.  - will place on Tele bed for obs - Trend Trop - Repeat EKG in the am  - prn Nitroglycerin, Morphine - No ASA (pt cannot tolerate aspirin) - Risk factor stratification: will check FLP and A1C  - check UDS - inpt card consult was requested via Epic  HYPERLIPIDEMIA: not taking meds -check FLP  Asthma: stable -Dulera, albuterol inhaler  Hypertensive urgency: Patient  states that she does not have history of hypertension though HTN is on her med list.  She states that her blood pressure elevates only when she has pain.  Her blood pressure is 196/107. -IV hydralazine as needed -IF patient has persistently elevated blood pressure, may need to start oral medications.  Back pain: Etiology is not clear, CT angiogram is negative for dissection.  Possibly due to musculoskeletal pain or muscle straining. -PRN Tylenol and percocet -As needed Robaxin   DVT ppx: SQ Lovenox Code Status: Full code Family Communication: None at bed side.       Disposition Plan:  Anticipate discharge back to previous home environment Consults called:  none Admission status: Obs / tele    Date of Service 07/16/2018    [facility]   If 7PM-7AM, please contact night-coverage [email or web address] Password [facility] 07/16/2018, 7:20 AM

## 2018-07-16 NOTE — ED Notes (Signed)
MD at bedside. This RN told not to give PRN Hydralazine at this time for HTN. MD to order PO BP medication.

## 2018-07-16 NOTE — ED Notes (Signed)
ED TO INPATIENT HANDOFF REPORT  Name/Age/Gender Sheila Tucker 68 y.o. female  Code Status   Home/SNF/Other Home  Chief Complaint Chest Pain; Back Pain  Level of Care/Admitting Diagnosis ED Disposition    ED Disposition Condition Comment   Admit  Hospital Area: [facility] [[id number]]  Level of Care: Telemetry [5]  Admit to tele based on following criteria: Other see comments  Comments: CP  Covid Evaluation: N/A  Diagnosis: Chest pain [938101]  Admitting Physician: Ivor Costa [[id number]]  Attending Physician: Ivor Costa [[id number]]  PT Class (Do Not Modify): Observation [104]  PT Acc Code (Do Not Modify): Observation [10022]       Medical History Past Medical History:  Diagnosis Date  . Allergy   . Asthma   . Cancer ([place])    uterine  . Cataract   . Diverticulosis   . GERD (gastroesophageal reflux disease)   . Hyperlipidemia    patient denies high lipids  . Hypertension    past, under control, no meds  . Lymphocytic colitis   . Tubular adenoma of colon     Allergies Allergies  Allergen Reactions  . Tetanus Toxoid     REACTION: FEVER AND SWELLING  . Asa [Aspirin]   . Nsaids Diarrhea    IV Location/Drains/Wounds Patient Lines/Drains/Airways Status   Active Line/Drains/Airways    Name:   Placement date:   Placement time:   Site:   Days:   Peripheral IV 07/16/18 Left Antecubital   07/16/18    0321    Antecubital   less than 1          Labs/Imaging Results for orders placed or performed during the hospital encounter of 07/16/18 (from the past 48 hour(s))  CBC with Differential     Status: Abnormal   Collection Time: 07/16/18  3:35 AM  Result Value Ref Range   WBC 8.3 4.0 - 10.5 K/uL   RBC 5.36 (H) 3.87 - 5.11 MIL/uL   Hemoglobin 15.3 (H) 12.0 - 15.0 g/dL   HCT 49.0 (H) 36.0 - 46.0 %   MCV 91.4 80.0 - 100.0 fL   MCH 28.5 26.0 - 34.0 pg   MCHC 31.2 30.0 - 36.0 g/dL   RDW 14.6 11.5 - 15.5 %   Platelets 240 150 - 400 K/uL   nRBC 0.0 0.0 -  0.2 %   Neutrophils Relative % 77 %   Neutro Abs 6.5 1.7 - 7.7 K/uL   Lymphocytes Relative 15 %   Lymphs Abs 1.2 0.7 - 4.0 K/uL   Monocytes Relative 5 %   Monocytes Absolute 0.4 0.1 - 1.0 K/uL   Eosinophils Relative 1 %   Eosinophils Absolute 0.1 0.0 - 0.5 K/uL   Basophils Relative 1 %   Basophils Absolute 0.1 0.0 - 0.1 K/uL   Immature Granulocytes 1 %   Abs Immature Granulocytes 0.05 0.00 - 0.07 K/uL    Comment: Performed at [facility], [place] [address], [place], [place] [address]  Comprehensive metabolic panel     Status: Abnormal   Collection Time: 07/16/18  3:35 AM  Result Value Ref Range   Sodium 143 135 - 145 mmol/L   Potassium 4.0 3.5 - 5.1 mmol/L   Chloride 106 98 - 111 mmol/L   CO2 25 22 - 32 mmol/L   Glucose, Bld 147 (H) 70 - 99 mg/dL   BUN 10 8 - 23 mg/dL   Creatinine, Ser 0.81 0.44 - 1.00 mg/dL   Calcium 8.8 (L) 8.9 -  10.3 mg/dL   Total Protein 7.5 6.5 - 8.1 g/dL   Albumin 3.9 3.5 - 5.0 g/dL   AST 23 15 - 41 U/L   ALT 24 0 - 44 U/L   Alkaline Phosphatase 67 38 - 126 U/L   Total Bilirubin 0.3 0.3 - 1.2 mg/dL   GFR calc non Af Amer >60 >60 mL/min   GFR calc Af Amer >60 >60 mL/min   Anion gap 12 5 - 15    Comment: Performed at [facility], [place] [address], [place], [place] [address]  Lipase, blood     Status: None   Collection Time: 07/16/18  3:35 AM  Result Value Ref Range   Lipase 23 11 - 51 U/L    Comment: Performed at [facility], [place] [address] [place], [place] [address]  POCT I-Stat EG7     Status: Abnormal   Collection Time: 07/16/18  3:48 AM  Result Value Ref Range   pH, Ven 7.293 7.250 - 7.430   pCO2, Ven 60.9 (H) 44.0 - 60.0 mmHg   pO2, Ven 31.0 (LL) 32.0 - 45.0 mmHg   Bicarbonate 29.5 (H) 20.0 - 28.0 mmol/L   TCO2 31 22 - 32 mmol/L   O2 Saturation 51.0 %   Acid-Base Excess 1.0 0.0 - 2.0 mmol/L   Sodium 143 135 - 145 mmol/L   Potassium 4.0 3.5 - 5.1 mmol/L   Calcium, Ion 1.19 1.15 - 1.40  mmol/L   HCT 49.0 (H) 36.0 - 46.0 %   Hemoglobin 16.7 (H) 12.0 - 15.0 g/dL   Patient temperature HIDE    Sample type VENOUS    Comment NOTIFIED PHYSICIAN   I-Stat Creatinine, ED (not at [facility])     Status: None   Collection Time: 07/16/18  3:49 AM  Result Value Ref Range   Creatinine, Ser 0.90 0.44 - 1.00 mg/dL  I-stat chem 8, ED (not at [facility])     Status: Abnormal   Collection Time: 07/16/18  3:49 AM  Result Value Ref Range   Sodium 144 135 - 145 mmol/L   Potassium 4.0 3.5 - 5.1 mmol/L   Chloride 105 98 - 111 mmol/L   BUN 10 8 - 23 mg/dL   Creatinine, Ser 0.80 0.44 - 1.00 mg/dL   Glucose, Bld 130 (H) 70 - 99 mg/dL   Calcium, Ion 1.18 1.15 - 1.40 mmol/L   TCO2 29 22 - 32 mmol/L   Hemoglobin 17.0 (H) 12.0 - 15.0 g/dL   HCT 50.0 (H) 36.0 - 46.0 %  Troponin I (High Sensitivity)     Status: None   Collection Time: 07/16/18  3:52 AM  Result Value Ref Range   Troponin I (High Sensitivity) 6.0 <18 ng/L    Comment: (NOTE) Elevated high sensitivity troponin I (hsTnI) values and significant  changes across serial measurements may suggest ACS but many other  chronic and acute conditions are known to elevate hsTnI results.  Refer to the "Links" section for chest pain algorithms and additional  guidance. Performed at [facility], [place] [address], [place], [place] [address]   SARS Coronavirus 2 (CEPHEID - Performed in [place] hospital lab), Hosp Order     Status: None   Collection Time: 07/16/18  3:52 AM   Specimen: Nasopharyngeal Swab  Result Value Ref Range   SARS Coronavirus 2 NEGATIVE NEGATIVE    Comment: (NOTE) If result is NEGATIVE SARS-CoV-2 target nucleic acids are NOT DETECTED. The SARS-CoV-2 RNA  is generally detectable in upper and lower  respiratory specimens during the acute phase of infection. The lowest  concentration of SARS-CoV-2 viral copies this assay can detect is 250  copies / mL. A negative result does not preclude SARS-CoV-2 infection   and should not be used as the sole basis for treatment or other  patient management decisions.  A negative result may occur with  improper specimen collection / handling, submission of specimen other  than nasopharyngeal swab, presence of viral mutation(s) within the  areas targeted by this assay, and inadequate number of viral copies  (<250 copies / mL). A negative result must be combined with clinical  observations, patient history, and epidemiological information. If result is POSITIVE SARS-CoV-2 target nucleic acids are DETECTED. The SARS-CoV-2 RNA is generally detectable in upper and lower  respiratory specimens dur ing the acute phase of infection.  Positive  results are indicative of active infection with SARS-CoV-2.  Clinical  correlation with patient history and other diagnostic information is  necessary to determine patient infection status.  Positive results do  not rule out bacterial infection or co-infection with other viruses. If result is PRESUMPTIVE POSTIVE SARS-CoV-2 nucleic acids MAY BE PRESENT.   A presumptive positive result was obtained on the submitted specimen  and confirmed on repeat testing.  While 2019 novel coronavirus  (SARS-CoV-2) nucleic acids may be present in the submitted sample  additional confirmatory testing may be necessary for epidemiological  and / or clinical management purposes  to differentiate between  SARS-CoV-2 and other Sarbecovirus currently known to infect humans.  If clinically indicated additional testing with an alternate test  methodology [phone]) is advised. The SARS-CoV-2 RNA is generally  detectable in upper and lower respiratory sp ecimens during the acute  phase of infection. The expected result is Negative. Fact Sheet for Patients:  StrictlyIdeas.no Fact Sheet for Healthcare Providers: BankingDealers.co.za This test is not yet approved or cleared by the [place] FDA  and has been authorized for detection and/or diagnosis of SARS-CoV-2 by FDA under an Emergency Use Authorization (EUA).  This EUA will remain in effect (meaning this test can be used) for the duration of the COVID-19 declaration under Section 564(b)(1) of the Act, 21 U.S.C. section 360bbb-3(b)(1), unless the authorization is terminated or revoked sooner. Performed at [facility], [place] [address], [place], [place] [address]   Troponin I (High Sensitivity)     Status: None   Collection Time: 07/16/18  6:23 AM  Result Value Ref Range   Troponin I (High Sensitivity) 12.0 <18 ng/L    Comment: (NOTE) Elevated high sensitivity troponin I (hsTnI) values and significant  changes across serial measurements may suggest ACS but many other  chronic and acute conditions are known to elevate hsTnI results.  Refer to the "Links" section for chest pain algorithms and additional  guidance. Performed at [facility], [place] [address] [place], [place] [address]    Dg Chest Portable 1 View  Result Date: 07/16/2018 CLINICAL DATA:  68 year old female with pain radiating from the chest to between the shoulder blades since 20/2 100 hours. Diaphoresis. EXAM: PORTABLE CHEST 1 VIEW COMPARISON:  Chest radiographs 03/23/2016 and earlier. FINDINGS: Portable AP semi upright view at 0341 hours. Mediastinal contours are stable and within normal limits. Visualized tracheal air column is within normal limits. Stable lung volumes. Allowing for portable technique the lungs are clear. No pneumothorax. Degenerative changes at both shoulders. No acute osseous abnormality identified. Paucity of bowel gas  in the upper abdomen. IMPRESSION: No acute cardiopulmonary abnormality. Electronically Signed   By: Genevie Ann M.D.   On: 07/16/2018 03:57   Ct Angio Chest/abd/pel For Dissection W And/or Wo Contrast  Result Date: 07/16/2018 CLINICAL DATA:  "Aortic dissection, known, follow-up". Chart history  of back pain extending between the shoulder blades. EXAM: CT ANGIOGRAPHY CHEST, ABDOMEN AND PELVIS TECHNIQUE: Multidetector CT imaging through the chest, abdomen and pelvis was performed using the standard protocol during bolus administration of intravenous contrast. Multiplanar reconstructed images and MIPs were obtained and reviewed to evaluate the vascular anatomy. CONTRAST:  168mL OMNIPAQUE IOHEXOL 350 MG/ML SOLN COMPARISON:  None. FINDINGS: CTA CHEST FINDINGS Cardiovascular: Noncontrast phase shows no intramural hematoma. Normal heart size. Negative for aortic dissection flap. Four vessel arch branching without superimposed abnormality. Enhancing proximal pulmonary arteries. Mediastinum/Nodes: Negative for hematoma or pneumomediastinum Lungs/Pleura: The central airways are clear. Mild atelectasis or scarring in the lower lungs. There is no edema, consolidation, effusion, or pneumothorax. Musculoskeletal: Prominent spondylosis with multi-level ankylosis. Advanced glenohumeral osteoarthritis. Review of the MIP images confirms the above findings. CTA ABDOMEN AND PELVIS FINDINGS VASCULAR Aorta: Mild atheromatous plaque.  No aneurysm or dissection. Celiac: Smooth and widely patent branches. SMA: Smooth and widely patent branches Renals: Single bilateral renal artery. Asymmetric less dense left main renal artery is attributed to streak artifact - downstream vessels are symmetrically enhancing and so is the renal parenchyma. There is no history of flank pain. Patient was scanned with the arms down. IMA: Patent Inflow: Mild atherosclerotic plaque without dissection flap or aneurysm. Veins: Unremarkable in the arterial phase Review of the MIP images confirms the above findings. NON-VASCULAR Hepatobiliary: Cystic density in the deep left lobe liver.No evidence of biliary obstruction or stone. Pancreas: Generalized fatty infiltration Spleen: Unremarkable. Adrenals/Urinary Tract: Negative adrenals. No hydronephrosis or  detectable stone (there is early contrast excretion from test bolus). Unremarkable bladder. Stomach/Bowel: No obstruction. Sigmoid diverticulosis. Appendectomy. Lymphatic: No mass or adenopathy. Reproductive:Hysterectomy. Other: No ascites or pneumoperitoneum. Musculoskeletal: Spondylosis and disc degeneration with levoscoliosis. Review of the MIP images confirms the above findings. IMPRESSION: 1. Negative for aortic dissection or other acute finding. 2. Overall mild atherosclerosis. Electronically Signed   By: Monte Fantasia M.D.   On: 07/16/2018 04:32    Pending Labs [employer] (From admission, onward)    Start     Ordered   Signed and Held  Urine rapid drug screen (hosp performed)  Once,   R     Signed and Held   Signed and Held  Hemoglobin A1c  Once,   R     Signed and Held   Signed and Held  Lipid panel  Once,   R    Comments: Please obtain as a fasting lipid panel - should not have eaten/ drank food for 8 hours prior to labs.    Signed and Held   Signed and Held  HIV antibody (Routine Testing)  Once,   R     Signed and Held          Vitals/Pain Today's Vitals   07/16/18 0625 07/16/18 0630 07/16/18 0700 07/16/18 0730  BP:  (!) 164/91 (!) 175/93 (!) 175/96  Pulse:  88 87 86  Resp:  15 17 16   Temp:      TempSrc:      SpO2:  98% 99% 99%  Weight:      Height:      PainSc: 2        Isolation Precautions No active  isolations  Medications Medications  morphine 2 MG/ML injection 2 mg (has no administration in time range)  hydrALAZINE (APRESOLINE) injection 5 mg (has no administration in time range)  nitroGLYCERIN (NITROSTAT) SL tablet 0.4 mg (has no administration in time range)  oxyCODONE-acetaminophen (PERCOCET/ROXICET) 5-325 MG per tablet 1 tablet (has no administration in time range)  methocarbamol (ROBAXIN) tablet 500 mg (has no administration in time range)  fentaNYL (SUBLIMAZE) injection 100 mcg (100 mcg Intravenous Given 07/16/18 0343)  prochlorperazine  (COMPAZINE) injection 10 mg (10 mg Intravenous Given 07/16/18 0424)  iohexol (OMNIPAQUE) 350 MG/ML injection 100 mL (100 mLs Intravenous Contrast Given 07/16/18 0404)  sodium chloride (PF) 0.9 % injection (  Given by Other 07/16/18 0434)  hydrALAZINE (APRESOLINE) injection 5 mg (5 mg Intravenous Given 07/16/18 0518)  HYDROmorphone (DILAUDID) injection 1 mg (1 mg Intravenous Given 07/16/18 0532)    Mobility walks with device

## 2018-07-16 NOTE — Consult Note (Signed)
Cardiology Consultation:   Patient ID: [name] MRN: [id number]; DOB: 1950-09-25  Admit date: 07/16/2018 Date of Consult: 07/16/2018  Primary Care Provider: Shawnee Knapp, MD Primary Cardiologist: New Primary Electrophysiologist:  None    Patient Profile:   Sheila Tucker is a 68 y.o. female, nonsmoker, with a hx of obesity, untreated HTN, HLD, FM of heart disease, chronic LBP, sedentary lifestyle, GERD, Asthma, lymphocytic colitis and remote uterine cancer (s/p of hysterectomy, no radiation or chemotherapy) who is being seen today for the evaluation of chest pain at the request of Dr. Blaine Hamper, Internal Medicine.  History of Present Illness:   Sheila Tucker has no prior cardiac history. CRFs include untreated HTN (although she denies this diagnosis) HLD and FH of heart disease. Her father had a congential valve disorder and died from a massive MI at age 74. She denies tobacco use but lots of second hand smoke exposure throughout her life. Denies diabetes. She has chronic LBP (sounds like spinal stenosis based on description), thus lives a very sedentary lifestyle. Body mass index is 43.56 kg/m. Her daily pain level is typically 3/10.   She reports that she was in her usual state of health until last night. She felt well most of the day and ate a tuna sandwich and potato chips for dinner. Around 10 PM, she was getting ready to go to sleep (sleeps in a recliner due to LBP). She devolved intense mid scapular pain radiating to her chest. 10/10 pain. Never felt pain like this before. Felt like someone "squeezing my spine". She had associated dyspnea, diaphoresis, n/v. Constant symptoms w/ no exacerbating or alleviating factors. She could not get in a comfortable position. Pain did not intensify ambulating around her home. After several hrs w/o relief, she called EMS around 2 AM and was transported to the ED.   BP extremely high in the ED w/ max BP of 202/118. EKG showed NSR, 96 bpm. No ST abnormalities. ?  LAE. Chest CT w/ contrast was negative for dissection. No other acute findings. Mild atherosclerosis noted. CXR also negative for acute abnormalities. COVID negative. H/H elevated. Hgb 17 and Hct 50. IV hydralazine was given for BP. Slightly improved but still elevated in the 169C systolic. Also given fentanyl and dilaudid which helped to relieve pain. Still with mild CP. States she feels "sore" now.   As noted above, she denies having HTN, although listed in [place]. She is not on medications and states that her BP has historically been controlled at routine checks at PCP office.    Heart Pathway Score:    Past Medical History:  Diagnosis Date   Allergy    Asthma    Cancer ([place])    uterine   Cataract    Diverticulosis    GERD (gastroesophageal reflux disease)    Hyperlipidemia    patient denies high lipids   Hypertension    past, under control, no meds   Lymphocytic colitis    Tubular adenoma of colon     Past Surgical History:  Procedure Laterality Date   ABDOMINAL HYSTERECTOMY  2003   & BSO for uterine cancer   APPENDECTOMY     COLONOSCOPY     tics and polyps (tubular adenomas), Neg in 2008   SHOULDER SURGERY  2004/2006   both shoulders, Dr Alphonzo Cruise   TONSILLECTOMY     UPPER GASTROINTESTINAL ENDOSCOPY  2003   esophagitis, gastritis, duodenitis     Home Medications:  Prior to Admission medications   Medication Sig  Start Date End Date Taking? Authorizing Provider  budesonide-formoterol (SYMBICORT) 160-4.5 MCG/ACT inhaler Inhale 2 puffs into the lungs 2 (two) times daily. 02/01/18  Yes Wendie Agreste, MD  famotidine (PEPCID) 40 MG tablet Take 1 tablet (40 mg total) by mouth 2 (two) times daily. 04/20/18  Yes Pyrtle, Lajuan Lines, MD  fluticasone (FLONASE) 50 MCG/ACT nasal spray SHAKE LIQUID AND USE 2 SPRAYS IN EACH NOSTRIL AT BEDTIME Patient taking differently: Place 1 spray into both nostrils daily as needed for allergies or rhinitis.  02/01/18  Yes Wendie Agreste, MD  hydroxypropyl methylcellulose / hypromellose (ISOPTO TEARS / GONIOVISC) 2.5 % ophthalmic solution Place 1 drop into both eyes 3 (three) times daily as needed for dry eyes.   Yes [provider]  Turmeric 400 MG CAPS Take 400 mg by mouth daily.   Yes [provider]  VENTOLIN HFA 108 (90 Base) MCG/ACT inhaler INHALE 2 PUFFS INTO THE LUNGS EVERY 4 HOURS AS NEEDED FOR WHEEZING OR SHORTNESS OF BREATH Patient taking differently: Inhale 2 puffs into the lungs every 4 (four) hours as needed for wheezing or shortness of breath.  02/01/18  Yes Wendie Agreste, MD  lidocaine (XYLOCAINE) 5 % ointment Apply 1 application topically as needed. Patient not taking: Reported on 07/16/2018 02/01/18   Wendie Agreste, MD    Inpatient Medications: Scheduled Meds:  Continuous Infusions:  PRN Meds: hydrALAZINE, methocarbamol, morphine injection, nitroGLYCERIN, oxyCODONE-acetaminophen  Allergies:    Allergies  Allergen Reactions   Tetanus Toxoid     REACTION: FEVER AND SWELLING   Asa [Aspirin]    Nsaids Diarrhea    Social History:   Social History   Socioeconomic History   Marital status: Married    Spouse name: terry   Number of children: Not on file   Years of education: Not on file   Highest education level: Not on file  Occupational History   Not on file  Social Needs   Financial resource strain: Not on file   Food insecurity    Worry: Not on file    Inability: Not on file   Transportation needs    Medical: Not on file    Non-medical: Not on file  Tobacco Use   Smoking status: Never Smoker   Smokeless tobacco: Never Used   Tobacco comment: Patient never smoked, around second hand smoke  Substance and Sexual Activity   Alcohol use: No    Alcohol/week: 0.0 standard drinks   Drug use: No   Sexual activity: Never  Lifestyle   Physical activity    Days per week: Not on file    Minutes per session: Not on file   Stress: Not on file    Relationships   Social connections    Talks on phone: Not on file    Gets together: Not on file    Attends religious service: Not on file    Active member of club or organization: Not on file    Attends meetings of clubs or organizations: Not on file    Relationship status: Not on file   Intimate partner violence    Fear of current or ex partner: Not on file    Emotionally abused: Not on file    Physically abused: Not on file    Forced sexual activity: Not on file  Other Topics Concern   Not on file  Social History Narrative   Not on file    Family History:    Family History  Problem Relation Age of Onset   Heart attack Father 58       Congential Valvular Heart disease   Colon cancer Paternal Grandfather    Colon cancer Maternal Grandfather    Breast cancer Paternal Aunt    Diabetes Paternal Aunt    Colon cancer Paternal Uncle        X 2   Stroke Neg Hx    Stomach cancer Neg Hx      ROS:  Please see the history of present illness.   All other ROS reviewed and negative.     Physical Exam/Data:   Vitals:   07/16/18 0530 07/16/18 0600 07/16/18 0630 07/16/18 0700  BP: (!) 178/94 (!) 170/92 (!) 164/91 (!) 175/93  Pulse: 99 88 88 87  Resp: 20 18 15 17   Temp:      TempSrc:      SpO2: 95% 97% 98% 99%  Weight:      Height:       No intake or output data in the 24 hours ending 07/16/18 0740 Last 3 Weights 07/16/2018 02/01/2018 08/14/2017  Weight (lbs) 295 lb 308 lb 299 lb 6.4 oz  Weight (kg) 133.811 kg 139.708 kg 135.807 kg     Body mass index is 43.56 kg/m.  General: Pleasant obese WF, Well nourished, well developed, in no acute distress HEENT: normal Lymph: no adenopathy Neck: no JVD Endocrine:  No thryomegaly Vascular: No carotid bruits; FA pulses 2+ bilaterally without bruits  Cardiac:  normal S1, S2; RRR; no murmur  Lungs:  clear to auscultation bilaterally, no wheezing, rhonchi or rales  Abd: soft, nontender, no hepatomegaly  Ext: no  edema Musculoskeletal:  No deformities, BUE and BLE strength normal and equal Skin: warm and dry  Neuro:  CNs 2-12 intact, no focal abnormalities noted Psych:  Normal affect   EKG:  The EKG was personally reviewed and demonstrates:  NSR, 96 bpm. No ST abnormalities. ? LAE.  Telemetry:  Telemetry was personally reviewed and demonstrates:  NSR, 90s, no arrhthymias   Relevant CV Studies: None   Laboratory Data:  High Sensitivity Troponin:   Recent Labs  Lab 07/16/18 0352 07/16/18 0623  TROPONINIHS 6.0 12.0     Cardiac EnzymesNo results for input(s): TROPONINI in the last 168 hours. No results for input(s): TROPIPOC in the last 168 hours.  Chemistry Recent Labs  Lab 07/16/18 0335 07/16/18 0348 07/16/18 0349  NA 143 143 144  K 4.0 4.0 4.0  CL 106  --  105  CO2 25  --   --   GLUCOSE 147*  --  130*  BUN 10  --  10  CREATININE 0.81  --  0.90   0.80  CALCIUM 8.8*  --   --   GFRNONAA >60  --   --   GFRAA >60  --   --   ANIONGAP 12  --   --     Recent Labs  Lab 07/16/18 0335  PROT 7.5  ALBUMIN 3.9  AST 23  ALT 24  ALKPHOS 67  BILITOT 0.3   Hematology Recent Labs  Lab 07/16/18 0335 07/16/18 0348 07/16/18 0349  WBC 8.3  --   --   RBC 5.36*  --   --   HGB 15.3* 16.7* 17.0*  HCT 49.0* 49.0* 50.0*  MCV 91.4  --   --   MCH 28.5  --   --   MCHC 31.2  --   --   RDW 14.6  --   --  PLT 240  --   --    BNPNo results for input(s): BNP, PROBNP in the last 168 hours.  DDimer No results for input(s): DDIMER in the last 168 hours.   Radiology/Studies:  Dg Chest Portable 1 View  Result Date: 07/16/2018 CLINICAL DATA:  68 year old female with pain radiating from the chest to between the shoulder blades since 20/2 100 hours. Diaphoresis. EXAM: PORTABLE CHEST 1 VIEW COMPARISON:  Chest radiographs 03/23/2016 and earlier. FINDINGS: Portable AP semi upright view at 0341 hours. Mediastinal contours are stable and within normal limits. Visualized tracheal air column is within  normal limits. Stable lung volumes. Allowing for portable technique the lungs are clear. No pneumothorax. Degenerative changes at both shoulders. No acute osseous abnormality identified. Paucity of bowel gas in the upper abdomen. IMPRESSION: No acute cardiopulmonary abnormality. Electronically Signed   By: Genevie Ann M.D.   On: 07/16/2018 03:57   Ct Angio Chest/abd/pel For Dissection W And/or Wo Contrast  Result Date: 07/16/2018 CLINICAL DATA:  "Aortic dissection, known, follow-up". Chart history of back pain extending between the shoulder blades. EXAM: CT ANGIOGRAPHY CHEST, ABDOMEN AND PELVIS TECHNIQUE: Multidetector CT imaging through the chest, abdomen and pelvis was performed using the standard protocol during bolus administration of intravenous contrast. Multiplanar reconstructed images and MIPs were obtained and reviewed to evaluate the vascular anatomy. CONTRAST:  13mL OMNIPAQUE IOHEXOL 350 MG/ML SOLN COMPARISON:  None. FINDINGS: CTA CHEST FINDINGS Cardiovascular: Noncontrast phase shows no intramural hematoma. Normal heart size. Negative for aortic dissection flap. Four vessel arch branching without superimposed abnormality. Enhancing proximal pulmonary arteries. Mediastinum/Nodes: Negative for hematoma or pneumomediastinum Lungs/Pleura: The central airways are clear. Mild atelectasis or scarring in the lower lungs. There is no edema, consolidation, effusion, or pneumothorax. Musculoskeletal: Prominent spondylosis with multi-level ankylosis. Advanced glenohumeral osteoarthritis. Review of the MIP images confirms the above findings. CTA ABDOMEN AND PELVIS FINDINGS VASCULAR Aorta: Mild atheromatous plaque.  No aneurysm or dissection. Celiac: Smooth and widely patent branches. SMA: Smooth and widely patent branches Renals: Single bilateral renal artery. Asymmetric less dense left main renal artery is attributed to streak artifact - downstream vessels are symmetrically enhancing and so is the renal parenchyma.  There is no history of flank pain. Patient was scanned with the arms down. IMA: Patent Inflow: Mild atherosclerotic plaque without dissection flap or aneurysm. Veins: Unremarkable in the arterial phase Review of the MIP images confirms the above findings. NON-VASCULAR Hepatobiliary: Cystic density in the deep left lobe liver.No evidence of biliary obstruction or stone. Pancreas: Generalized fatty infiltration Spleen: Unremarkable. Adrenals/Urinary Tract: Negative adrenals. No hydronephrosis or detectable stone (there is early contrast excretion from test bolus). Unremarkable bladder. Stomach/Bowel: No obstruction. Sigmoid diverticulosis. Appendectomy. Lymphatic: No mass or adenopathy. Reproductive:Hysterectomy. Other: No ascites or pneumoperitoneum. Musculoskeletal: Spondylosis and disc degeneration with levoscoliosis. Review of the MIP images confirms the above findings. IMPRESSION: 1. Negative for aortic dissection or other acute finding. 2. Overall mild atherosclerosis. Electronically Signed   By: Monte Fantasia M.D.   On: 07/16/2018 04:32    Assessment and Plan:   Tanessa Tidd is a 68 y.o. female, nonsmoker, with a hx of obesity, untreated HTN, HLD, FM of heart disease, chronic LBP, sedentary lifestyle, GERD, Asthma, lymphocytic colitis and remote uterine cancer (s/p of hysterectomy, no radiation or chemotherapy) who is being seen today for the evaluation of chest pain at the request of Dr. Blaine Hamper, Internal Medicine.  1. Chest Pain: Pt presented w/ prolonged episode of CP, in the setting of severly elevated BP,  that did not change w/ exertion. Calculated Heart Score is 5. Initial Hs Troponin normal at 6 ng/dL. 2nd troponin also normal at 12 ng/dL. EKG shows NSR w/ no ischemic abnormalities. Chest CT negative for dissection and CXR also unremarkable. Suspect CP likely secondary to severely elevated HTN. At this time, recommend BP control. Given her risk factors, would recommend echocardiogram, however this  can likely be done as an outpatient. We can also reassess as an outpatient and determine need for outpatient ischemic w/u if recurrent symptoms, despite BP control.   2. HTN: severely elevated BP in the ED. CT negative for dissection. Renal function normal and K normal. Consider CCB and thiazide diuretic as first line therapy.    3. Obesity: Body mass index is 43.56 kg/m. Unfortunately, due to chronic LBP, she is limited physically and dose not get much exercise.   4. HLD: not on any medications currently. LDL as high as 110 in the past and TGs 188 previously. Last lipid panel was 08/2017. See below. PCP can continue to follow.      Component Value Date/Time   CHOL 165 08/14/2017 1121   TRIG 113 08/14/2017 1121   HDL 46 08/14/2017 1121   CHOLHDL 3.6 08/14/2017 1121   CHOLHDL 4.6 08/20/2015 1553   VLDL 35 (H) 08/20/2015 1553   LDLCALC 96 08/14/2017 1121   LDLDIRECT 144.8 11/04/2011 0849     For questions or updates, please contact [place] Please consult [email or web address] for contact info under     Signed, Lyda Jester, PA-C  07/16/2018 7:40 AM

## 2018-07-16 NOTE — ED Triage Notes (Signed)
Per EMS - pt coming from home with complaints of back pain between the shoulder blades radiating into her chest. Pt N/V. Pt diaphoretic. Pain started approx 2200. Hx of nerve damage in lumbar region of back. Pt states that she has never experienced pain like this before.    EKG NS   184 96 96 HR 94% RA  97.5 18 R   4mg  zofran   20 Lt AC

## 2018-07-16 NOTE — ED Notes (Signed)
Patient has Nuclear Medicine Appointment at [facility] at [place] 07/17/2018. Patient is to remain NPO after midnight.   [facility] been called to schedule patient transport to [facility] 07/17/18 from [facility] room 13. If patient gets moved to [place] room at [facility], [place] to call [facility] at [phone] and give them the updated room number.

## 2018-07-16 NOTE — ED Notes (Signed)
Notified EDP Messner of critical lab value:  I-STAT 7 VENOUS: PO2 31

## 2018-07-16 NOTE — ED Notes (Signed)
ED TO INPATIENT HANDOFF REPORT  Name/Age/Gender Sheila Tucker 68 y.o. female  Code Status    Code Status Orders  (From admission, onward)         Start     Ordered   07/16/18 0919  Full code  Continuous     07/16/18 0918        Code Status History    This patient has a current code status but no historical code status.   Advance Care Planning Activity      Home/SNF/Other Home  Chief Complaint Chest Pain; Back Pain  Level of Care/Admitting Diagnosis ED Disposition    ED Disposition Condition Comment   Admit  Hospital Area: [facility] [[id number]]  Level of Care: Telemetry [5]  Admit to tele based on following criteria: Other see comments  Comments: CP  Covid Evaluation: N/A  Diagnosis: Chest pain [147829]  Admitting Physician: Ivor Costa [[id number]]  Attending Physician: Ivor Costa [[id number]]  PT Class (Do Not Modify): Observation [104]  PT Acc Code (Do Not Modify): Observation [10022]       Medical History Past Medical History:  Diagnosis Date  . Allergy   . Asthma   . Cancer ([place])    uterine  . Cataract   . Diverticulosis   . GERD (gastroesophageal reflux disease)   . Hyperlipidemia    patient denies high lipids  . Hypertension    past, under control, no meds  . Lymphocytic colitis   . Tubular adenoma of colon     Allergies Allergies  Allergen Reactions  . Tetanus Toxoid     REACTION: FEVER AND SWELLING  . Asa [Aspirin]   . Nsaids Diarrhea    IV Location/Drains/Wounds Patient Lines/Drains/Airways Status   Active Line/Drains/Airways    Name:   Placement date:   Placement time:   Site:   Days:   Peripheral IV 07/16/18 Left Antecubital   07/16/18    0321    Antecubital   less than 1          Labs/Imaging Results for orders placed or performed during the hospital encounter of 07/16/18 (from the past 48 hour(s))  CBC with Differential     Status: Abnormal   Collection Time: 07/16/18  3:35 AM  Result Value Ref Range   WBC 8.3  4.0 - 10.5 K/uL   RBC 5.36 (H) 3.87 - 5.11 MIL/uL   Hemoglobin 15.3 (H) 12.0 - 15.0 g/dL   HCT 49.0 (H) 36.0 - 46.0 %   MCV 91.4 80.0 - 100.0 fL   MCH 28.5 26.0 - 34.0 pg   MCHC 31.2 30.0 - 36.0 g/dL   RDW 14.6 11.5 - 15.5 %   Platelets 240 150 - 400 K/uL   nRBC 0.0 0.0 - 0.2 %   Neutrophils Relative % 77 %   Neutro Abs 6.5 1.7 - 7.7 K/uL   Lymphocytes Relative 15 %   Lymphs Abs 1.2 0.7 - 4.0 K/uL   Monocytes Relative 5 %   Monocytes Absolute 0.4 0.1 - 1.0 K/uL   Eosinophils Relative 1 %   Eosinophils Absolute 0.1 0.0 - 0.5 K/uL   Basophils Relative 1 %   Basophils Absolute 0.1 0.0 - 0.1 K/uL   Immature Granulocytes 1 %   Abs Immature Granulocytes 0.05 0.00 - 0.07 K/uL    Comment: Performed at [facility], [place] [address] [place], [place] [address]  Comprehensive metabolic panel     Status: Abnormal   Collection Time: 07/16/18  3:35 AM  Result Value Ref Range   Sodium 143 135 - 145 mmol/L   Potassium 4.0 3.5 - 5.1 mmol/L   Chloride 106 98 - 111 mmol/L   CO2 25 22 - 32 mmol/L   Glucose, Bld 147 (H) 70 - 99 mg/dL   BUN 10 8 - 23 mg/dL   Creatinine, Ser 0.81 0.44 - 1.00 mg/dL   Calcium 8.8 (L) 8.9 - 10.3 mg/dL   Total Protein 7.5 6.5 - 8.1 g/dL   Albumin 3.9 3.5 - 5.0 g/dL   AST 23 15 - 41 U/L   ALT 24 0 - 44 U/L   Alkaline Phosphatase 67 38 - 126 U/L   Total Bilirubin 0.3 0.3 - 1.2 mg/dL   GFR calc non Af Amer >60 >60 mL/min   GFR calc Af Amer >60 >60 mL/min   Anion gap 12 5 - 15    Comment: Performed at [facility], [place] [address], [place], [place] [address]  Lipase, blood     Status: None   Collection Time: 07/16/18  3:35 AM  Result Value Ref Range   Lipase 23 11 - 51 U/L    Comment: Performed at [facility], [place] [address] [place],  [address]  POCT I-Stat EG7     Status: Abnormal   Collection Time: 07/16/18  3:48 AM  Result Value Ref Range   pH, Ven 7.293 7.250 - 7.430   pCO2, Ven 60.9 (H) 44.0 -  60.0 mmHg   pO2, Ven 31.0 (LL) 32.0 - 45.0 mmHg   Bicarbonate 29.5 (H) 20.0 - 28.0 mmol/L   TCO2 31 22 - 32 mmol/L   O2 Saturation 51.0 %   Acid-Base Excess 1.0 0.0 - 2.0 mmol/L   Sodium 143 135 - 145 mmol/L   Potassium 4.0 3.5 - 5.1 mmol/L   Calcium, Ion 1.19 1.15 - 1.40 mmol/L   HCT 49.0 (H) 36.0 - 46.0 %   Hemoglobin 16.7 (H) 12.0 - 15.0 g/dL   Patient temperature HIDE    Sample type VENOUS    Comment NOTIFIED PHYSICIAN   I-Stat Creatinine, ED (not at [facility])     Status: None   Collection Time: 07/16/18  3:49 AM  Result Value Ref Range   Creatinine, Ser 0.90 0.44 - 1.00 mg/dL  I-stat chem 8, ED (not at [facility] or [facility])     Status: Abnormal   Collection Time: 07/16/18  3:49 AM  Result Value Ref Range   Sodium 144 135 - 145 mmol/L   Potassium 4.0 3.5 - 5.1 mmol/L   Chloride 105 98 - 111 mmol/L   BUN 10 8 - 23 mg/dL   Creatinine, Ser 0.80 0.44 - 1.00 mg/dL   Glucose, Bld 130 (H) 70 - 99 mg/dL   Calcium, Ion 1.18 1.15 - 1.40 mmol/L   TCO2 29 22 - 32 mmol/L   Hemoglobin 17.0 (H) 12.0 - 15.0 g/dL   HCT 50.0 (H) 36.0 - 46.0 %  Troponin I (High Sensitivity)     Status: None   Collection Time: 07/16/18  3:52 AM  Result Value Ref Range   Troponin I (High Sensitivity) 6.0 <18 ng/L    Comment: (NOTE) Elevated high sensitivity troponin I (hsTnI) values and significant  changes across serial measurements may suggest ACS but many other  chronic and acute conditions are known to elevate hsTnI results.  Refer to the "Links" section for chest pain algorithms and additional  guidance. Performed at [facility], [place] [address], [place], [place] [address]   SARS Coronavirus 2 (CEPHEID - Performed in [place] hospital lab), Hosp Order     Status: None   Collection Time: 07/16/18  3:52 AM   Specimen: Nasopharyngeal Swab  Result Value Ref Range   SARS Coronavirus 2 NEGATIVE NEGATIVE    Comment: (NOTE) If result is NEGATIVE SARS-CoV-2 target nucleic acids are NOT  DETECTED. The SARS-CoV-2 RNA is generally detectable in upper and lower  respiratory specimens during the acute phase of infection. The lowest  concentration of SARS-CoV-2 viral copies this assay can detect is 250  copies / mL. A negative result does not preclude SARS-CoV-2 infection  and should not be used as the sole basis for treatment or other  patient management decisions.  A negative result may occur with  improper specimen collection / handling, submission of specimen other  than nasopharyngeal swab, presence of viral mutation(s) within the  areas targeted by this assay, and inadequate number of viral copies  (<250 copies / mL). A negative result must be combined with clinical  observations, patient history, and epidemiological information. If result is POSITIVE SARS-CoV-2 target nucleic acids are DETECTED. The SARS-CoV-2 RNA is generally detectable in upper and lower  respiratory specimens dur ing the acute phase of infection.  Positive  results are indicative of active infection with SARS-CoV-2.  Clinical  correlation with patient history and other diagnostic information is  necessary to determine patient infection status.  Positive results do  not rule out bacterial infection or co-infection with other viruses. If result is PRESUMPTIVE POSTIVE SARS-CoV-2 nucleic acids MAY BE PRESENT.   A presumptive positive result was obtained on the submitted specimen  and confirmed on repeat testing.  While 2019 novel coronavirus  (SARS-CoV-2) nucleic acids may be present in the submitted sample  additional confirmatory testing may be necessary for epidemiological  and / or clinical management purposes  to differentiate between  SARS-CoV-2 and other Sarbecovirus currently known to infect humans.  If clinically indicated additional testing with an alternate test  methodology [phone]) is advised. The SARS-CoV-2 RNA is generally  detectable in upper and lower respiratory sp ecimens during  the acute  phase of infection. The expected result is Negative. Fact Sheet for Patients:  StrictlyIdeas.no Fact Sheet for Healthcare Providers: BankingDealers.co.za This test is not yet approved or cleared by the [place] FDA and has been authorized for detection and/or diagnosis of SARS-CoV-2 by FDA under an Emergency Use Authorization (EUA).  This EUA will remain in effect (meaning this test can be used) for the duration of the COVID-19 declaration under Section 564(b)(1) of the Act, 21 U.S.C. section 360bbb-3(b)(1), unless the authorization is terminated or revoked sooner. Performed at [facility], [place] [address] [place], [place] [address]   Troponin I (High Sensitivity)     Status: None   Collection Time: 07/16/18  6:23 AM  Result Value Ref Range   Troponin I (High Sensitivity) 12.0 <18 ng/L    Comment: (NOTE) Elevated high sensitivity troponin I (hsTnI) values and significant  changes across serial measurements may suggest ACS but many other  chronic and acute conditions are known to elevate hsTnI results.  Refer to the "Links" section for chest pain algorithms and additional  guidance. Performed at [facility], [place] [address], [place], [place] [address]   Troponin I (High Sensitivity)     Status: None   Collection Time: 07/16/18  9:27 AM  Result Value Ref  Range   Troponin I (High Sensitivity) 13.0 <18 ng/L    Comment: (NOTE) Elevated high sensitivity troponin I (hsTnI) values and significant  changes across serial measurements may suggest ACS but many other  chronic and acute conditions are known to elevate hsTnI results.  Refer to the "Links" section for chest pain algorithms and additional  guidance. Performed at [facility], [place] [address] [place], [place] [address]   Hemoglobin A1c     Status: Abnormal   Collection Time: 07/16/18  9:27 AM  Result Value  Ref Range   Hgb A1c MFr Bld 5.8 (H) 4.8 - 5.6 %    Comment: (NOTE) Pre diabetes:          5.7%-6.4% Diabetes:              >6.4% Glycemic control for   <7.0% adults with diabetes    Mean Plasma Glucose 119.76 mg/dL    Comment: Performed at [place] [address] [place], [place] [address]  Lipid panel     Status: None   Collection Time: 07/16/18  9:27 AM  Result Value Ref Range   Cholesterol 152 0 - 200 mg/dL   Triglycerides 64 <150 mg/dL   HDL 45 >40 mg/dL   Total CHOL/HDL Ratio 3.4 RATIO   VLDL 13 0 - 40 mg/dL   LDL Cholesterol 94 0 - 99 mg/dL    Comment:        Total Cholesterol/HDL:CHD Risk Coronary Heart Disease Risk Table                     Men   Women  1/2 Average Risk   3.4   3.3  Average Risk       5.0   4.4  2 X Average Risk   9.6   7.1  3 X Average Risk  23.4   11.0        Use the calculated Patient Ratio above and the CHD Risk Table to determine the patient's CHD Risk.        ATP III CLASSIFICATION (LDL):  <100     mg/dL   Optimal  100-129  mg/dL   Near or Above                    Optimal  130-159  mg/dL   Borderline  160-189  mg/dL   High  >190     mg/dL   Very High Performed at [facility] [address] [place], [place] [address]   Troponin I (High Sensitivity)     Status: None   Collection Time: 07/16/18 11:37 AM  Result Value Ref Range   Troponin I (High Sensitivity) 12.0 <18 ng/L    Comment: (NOTE) Elevated high sensitivity troponin I (hsTnI) values and significant  changes across serial measurements may suggest ACS but many other  chronic and acute conditions are known to elevate hsTnI results.  Refer to the "Links" section for chest pain algorithms and additional  guidance. Performed at [facility], [place] [address], [place], [place] [address]    Dg Chest Portable 1 View  Result Date: 07/16/2018 CLINICAL DATA:  68 year old female with pain radiating from the chest to between the shoulder  blades since 20/2 100 hours. Diaphoresis. EXAM: PORTABLE CHEST 1 VIEW COMPARISON:  Chest radiographs 03/23/2016 and earlier. FINDINGS: Portable AP semi upright view at 0341 hours. Mediastinal contours are stable and within normal limits. Visualized  tracheal air column is within normal limits. Stable lung volumes. Allowing for portable technique the lungs are clear. No pneumothorax. Degenerative changes at both shoulders. No acute osseous abnormality identified. Paucity of bowel gas in the upper abdomen. IMPRESSION: No acute cardiopulmonary abnormality. Electronically Signed   By: Genevie Ann M.D.   On: 07/16/2018 03:57   Ct Angio Chest/abd/pel For Dissection W And/or Wo Contrast  Result Date: 07/16/2018 CLINICAL DATA:  "Aortic dissection, known, follow-up". Chart history of back pain extending between the shoulder blades. EXAM: CT ANGIOGRAPHY CHEST, ABDOMEN AND PELVIS TECHNIQUE: Multidetector CT imaging through the chest, abdomen and pelvis was performed using the standard protocol during bolus administration of intravenous contrast. Multiplanar reconstructed images and MIPs were obtained and reviewed to evaluate the vascular anatomy. CONTRAST:  122mL OMNIPAQUE IOHEXOL 350 MG/ML SOLN COMPARISON:  None. FINDINGS: CTA CHEST FINDINGS Cardiovascular: Noncontrast phase shows no intramural hematoma. Normal heart size. Negative for aortic dissection flap. Four vessel arch branching without superimposed abnormality. Enhancing proximal pulmonary arteries. Mediastinum/Nodes: Negative for hematoma or pneumomediastinum Lungs/Pleura: The central airways are clear. Mild atelectasis or scarring in the lower lungs. There is no edema, consolidation, effusion, or pneumothorax. Musculoskeletal: Prominent spondylosis with multi-level ankylosis. Advanced glenohumeral osteoarthritis. Review of the MIP images confirms the above findings. CTA ABDOMEN AND PELVIS FINDINGS VASCULAR Aorta: Mild atheromatous plaque.  No aneurysm or dissection.  Celiac: Smooth and widely patent branches. SMA: Smooth and widely patent branches Renals: Single bilateral renal artery. Asymmetric less dense left main renal artery is attributed to streak artifact - downstream vessels are symmetrically enhancing and so is the renal parenchyma. There is no history of flank pain. Patient was scanned with the arms down. IMA: Patent Inflow: Mild atherosclerotic plaque without dissection flap or aneurysm. Veins: Unremarkable in the arterial phase Review of the MIP images confirms the above findings. NON-VASCULAR Hepatobiliary: Cystic density in the deep left lobe liver.No evidence of biliary obstruction or stone. Pancreas: Generalized fatty infiltration Spleen: Unremarkable. Adrenals/Urinary Tract: Negative adrenals. No hydronephrosis or detectable stone (there is early contrast excretion from test bolus). Unremarkable bladder. Stomach/Bowel: No obstruction. Sigmoid diverticulosis. Appendectomy. Lymphatic: No mass or adenopathy. Reproductive:Hysterectomy. Other: No ascites or pneumoperitoneum. Musculoskeletal: Spondylosis and disc degeneration with levoscoliosis. Review of the MIP images confirms the above findings. IMPRESSION: 1. Negative for aortic dissection or other acute finding. 2. Overall mild atherosclerosis. Electronically Signed   By: Monte Fantasia M.D.   On: 07/16/2018 04:32    Pending Labs Unresulted Labs (From admission, onward)    Start     Ordered   07/16/18 0919  Urine rapid drug screen (hosp performed)  Once,   STAT     07/16/18 0918   07/16/18 0919  HIV antibody (Routine Testing)  Once,   STAT     07/16/18 0918          Vitals/Pain Today's Vitals   07/16/18 1200 07/16/18 1230 07/16/18 1300 07/16/18 1330  BP: (!) 176/77 (!) 173/85 (!) 157/81 (!) 155/76  Pulse: 85 92 87 81  Resp: 16 17 14 14   Temp:      TempSrc:      SpO2: 99% 99% 99% 100%  Weight:      Height:      PainSc:        Isolation Precautions No active  isolations  Medications Medications  morphine 2 MG/ML injection 2 mg (has no administration in time range)  hydrALAZINE (APRESOLINE) injection 5 mg (5 mg Intravenous Given 07/16/18 1125)  nitroGLYCERIN (NITROSTAT) SL  tablet 0.4 mg (has no administration in time range)  famotidine (PEPCID) tablet 40 mg (40 mg Oral Given 07/16/18 1030)  mometasone-formoterol (DULERA) 200-5 MCG/ACT inhaler 2 puff (2 puffs Inhalation Given 07/16/18 1127)  fluticasone (FLONASE) 50 MCG/ACT nasal spray 1 spray (has no administration in time range)  albuterol (VENTOLIN HFA) 108 (90 Base) MCG/ACT inhaler 2 puff (has no administration in time range)  acetaminophen (TYLENOL) tablet 650 mg (has no administration in time range)  ondansetron (ZOFRAN) injection 4 mg (has no administration in time range)  enoxaparin (LOVENOX) injection 30 mg (30 mg Subcutaneous Given 07/16/18 1127)  oxyCODONE-acetaminophen (PERCOCET/ROXICET) 5-325 MG per tablet 1 tablet (1 tablet Oral Given 07/16/18 0806)  methocarbamol (ROBAXIN) tablet 500 mg (500 mg Oral Given 07/16/18 0806)  amLODipine (NORVASC) tablet 5 mg (5 mg Oral Given 07/16/18 1135)  polyvinyl alcohol (LIQUIFILM TEARS) 1.4 % ophthalmic solution 1 drop (1 drop Both Eyes Given 07/16/18 1126)  perflutren lipid microspheres (DEFINITY) IV suspension (4 mLs Intravenous Given 07/16/18 1117)  hydrALAZINE (APRESOLINE) tablet 25 mg (25 mg Oral Given 07/16/18 1221)  fentaNYL (SUBLIMAZE) injection 100 mcg (100 mcg Intravenous Given 07/16/18 0343)  prochlorperazine (COMPAZINE) injection 10 mg (10 mg Intravenous Given 07/16/18 0424)  iohexol (OMNIPAQUE) 350 MG/ML injection 100 mL (100 mLs Intravenous Contrast Given 07/16/18 0404)  sodium chloride (PF) 0.9 % injection (  Given by Other 07/16/18 0434)  hydrALAZINE (APRESOLINE) injection 5 mg (5 mg Intravenous Given 07/16/18 0518)  HYDROmorphone (DILAUDID) injection 1 mg (1 mg Intravenous Given 07/16/18 0532)    Mobility walks with person assist

## 2018-07-16 NOTE — ED Provider Notes (Signed)
Emergency Department Provider Note   I have reviewed the triage vital signs and the nursing notes.   HISTORY  Chief Complaint Back Pain and Nausea   HPI Sheila Tucker is a 68 y.o. female without significant past medical history other than what is documented below who presents to the emergency department today with acute onset of back pain that radiates towards her chest worse with taking in a deep breath or any breath.  Associated multiple episodes of nonbloody nonbilious emesis.  No fevers but is diaphoretic.  No shortness of breath or lightheadedness.  No changes in bowel movements.  No urinary symptoms.  Started without warning and not seeming to be related to eating.  EMS brought her any give her Zofran which did not seem to help.   No other associated or modifying symptoms.    Past Medical History:  Diagnosis Date  . Allergy   . Asthma   . Cancer ([place])    uterine  . Cataract   . Diverticulosis   . GERD (gastroesophageal reflux disease)   . Hyperlipidemia    patient denies high lipids  . Hypertension    past, under control, no meds  . Lymphocytic colitis   . Tubular adenoma of colon     Patient Active Problem List   Diagnosis Date Noted  . Hypertensive urgency 07/16/2018  . Chest pain 07/16/2018  . Back pain 07/16/2018  . Lymphocytic colitis 03/25/2014  . Dyspnea 10/28/2013  . Colon polyps 07/09/2010  . Diverticulosis 07/09/2010  . Essential hypertension 01/27/2009  . HYPERLIPIDEMIA 02/20/2007  . DYSMETABOLIC SYNDROME X 22/02/5425  . Asthma 02/20/2007  . GERD 02/20/2007    Past Surgical History:  Procedure Laterality Date  . ABDOMINAL HYSTERECTOMY  2003   & BSO for uterine cancer  . APPENDECTOMY    . COLONOSCOPY     tics and polyps (tubular adenomas), Neg in 2008  . SHOULDER SURGERY  2004/2006   both shoulders, Dr Alphonzo Cruise  . TONSILLECTOMY    . UPPER GASTROINTESTINAL ENDOSCOPY  2003   esophagitis, gastritis, duodenitis    Current Outpatient Rx   . Order #: [id number] Class: Normal  . Order #: [id number] Class: Normal  . Order #: [id number] Class: Normal  . Order #: [id number] Class: Historical Med  . Order #: [id number] Class: Historical Med  . Order #: [id number] Class: Normal  . Order #: [id number] Class: Normal    Allergies Tetanus toxoid, Asa [aspirin], and Nsaids  Family History  Problem Relation Age of Onset  . Heart attack Father 74       Congential Valvular Heart disease  . Colon cancer Paternal Grandfather   . Colon cancer Maternal Grandfather   . Breast cancer Paternal Aunt   . Diabetes Paternal Aunt   . Colon cancer Paternal Uncle        X 2  . Stroke Neg Hx   . Stomach cancer Neg Hx     Social History Social History   Tobacco Use  . Smoking status: Never Smoker  . Smokeless tobacco: Never Used  . Tobacco comment: Patient never smoked, around second hand smoke  Substance Use Topics  . Alcohol use: No    Alcohol/week: 0.0 standard drinks  . Drug use: No    Review of Systems  All other systems negative except as documented in the HPI. All pertinent positives and negatives as reviewed in the HPI. ____________________________________________   PHYSICAL EXAM:  VITAL SIGNS: ED Triage Vitals  Enc Vitals Group     BP 07/16/18 0326 Marland Kitchen)  202/118     Pulse Rate 07/16/18 0326 99     Resp 07/16/18 0326 18     Temp 07/16/18 0326 98.8 F (37.1 C)     Temp Source 07/16/18 0326 Oral     SpO2 07/16/18 0318 94 %     Weight 07/16/18 0325 295 lb (133.8 kg)     Height 07/16/18 0325 5\' 9"  (1.753 m)    Constitutional: Alert and oriented. Appears to be in pain, distressed 2/2 same, diaphoretic.  Eyes: Conjunctivae are normal. PERRL. EOMI. Head: Atraumatic. Nose: No congestion/rhinnorhea. Mouth/Throat: Mucous membranes are moist.  Oropharynx non-erythematous. Neck: No stridor.  No meningeal signs.   Cardiovascular: Normal rate, regular rhythm. Good peripheral circulation. Grossly normal heart sounds.   Respiratory:  Normal respiratory effort.  No retractions. Lungs CTAB. Gastrointestinal: Soft and nontender. No distention.  Musculoskeletal: No lower extremity tenderness nor edema. No gross deformities of extremities. Neurologic:  Normal speech and language. No gross focal neurologic deficits are appreciated.  Skin:  Skin is warm, diaphoretic and intact. No rash noted.   ____________________________________________   LABS (all labs ordered are listed, but only abnormal results are displayed)  Labs Reviewed  CBC WITH DIFFERENTIAL/PLATELET - Abnormal; Notable for the following components:      Result Value   RBC 5.36 (*)    Hemoglobin 15.3 (*)    HCT 49.0 (*)    All other components within normal limits  COMPREHENSIVE METABOLIC PANEL - Abnormal; Notable for the following components:   Glucose, Bld 147 (*)    Calcium 8.8 (*)    All other components within normal limits  I-STAT CHEM 8, ED - Abnormal; Notable for the following components:   Glucose, Bld 130 (*)    Hemoglobin 17.0 (*)    HCT 50.0 (*)    All other components within normal limits  POCT I-STAT EG7 - Abnormal; Notable for the following components:   pCO2, Ven 60.9 (*)    pO2, Ven 31.0 (*)    Bicarbonate 29.5 (*)    HCT 49.0 (*)    Hemoglobin 16.7 (*)    All other components within normal limits  SARS CORONAVIRUS 2 (HOSPITAL ORDER, [facility])  LIPASE, BLOOD  I-STAT CREATININE, ED  TROPONIN I (HIGH SENSITIVITY)  TROPONIN I (HIGH SENSITIVITY)   ____________________________________________  EKG   EKG Interpretation  Date/Time:  Monday July 16 2018 03:40:47 EDT Ventricular Rate:  96 PR Interval:    QRS Duration: 88 QT Interval:  367 QTC Calculation: 464 R Axis:   66 Text Interpretation:  Sinus rhythm No significant change since last tracing Confirmed by Merrily Pew [phone]) on 07/16/2018 3:49:08 AM       ____________________________________________  RADIOLOGY  Dg Chest Portable 1 View   Result Date: 07/16/2018 CLINICAL DATA:  68 year old female with pain radiating from the chest to between the shoulder blades since 20/2 100 hours. Diaphoresis. EXAM: PORTABLE CHEST 1 VIEW COMPARISON:  Chest radiographs 03/23/2016 and earlier. FINDINGS: Portable AP semi upright view at 0341 hours. Mediastinal contours are stable and within normal limits. Visualized tracheal air column is within normal limits. Stable lung volumes. Allowing for portable technique the lungs are clear. No pneumothorax. Degenerative changes at both shoulders. No acute osseous abnormality identified. Paucity of bowel gas in the upper abdomen. IMPRESSION: No acute cardiopulmonary abnormality. Electronically Signed   By: Genevie Ann M.D.   On: 07/16/2018 03:57   Ct Angio Chest/abd/pel For Dissection W And/or Wo Contrast  Result  Date: 07/16/2018 CLINICAL DATA:  "Aortic dissection, known, follow-up". Chart history of back pain extending between the shoulder blades. EXAM: CT ANGIOGRAPHY CHEST, ABDOMEN AND PELVIS TECHNIQUE: Multidetector CT imaging through the chest, abdomen and pelvis was performed using the standard protocol during bolus administration of intravenous contrast. Multiplanar reconstructed images and MIPs were obtained and reviewed to evaluate the vascular anatomy. CONTRAST:  170mL OMNIPAQUE IOHEXOL 350 MG/ML SOLN COMPARISON:  None. FINDINGS: CTA CHEST FINDINGS Cardiovascular: Noncontrast phase shows no intramural hematoma. Normal heart size. Negative for aortic dissection flap. Four vessel arch branching without superimposed abnormality. Enhancing proximal pulmonary arteries. Mediastinum/Nodes: Negative for hematoma or pneumomediastinum Lungs/Pleura: The central airways are clear. Mild atelectasis or scarring in the lower lungs. There is no edema, consolidation, effusion, or pneumothorax. Musculoskeletal: Prominent spondylosis with multi-level ankylosis. Advanced glenohumeral osteoarthritis. Review of the MIP images confirms the  above findings. CTA ABDOMEN AND PELVIS FINDINGS VASCULAR Aorta: Mild atheromatous plaque.  No aneurysm or dissection. Celiac: Smooth and widely patent branches. SMA: Smooth and widely patent branches Renals: Single bilateral renal artery. Asymmetric less dense left main renal artery is attributed to streak artifact - downstream vessels are symmetrically enhancing and so is the renal parenchyma. There is no history of flank pain. Patient was scanned with the arms down. IMA: Patent Inflow: Mild atherosclerotic plaque without dissection flap or aneurysm. Veins: Unremarkable in the arterial phase Review of the MIP images confirms the above findings. NON-VASCULAR Hepatobiliary: Cystic density in the deep left lobe liver.No evidence of biliary obstruction or stone. Pancreas: Generalized fatty infiltration Spleen: Unremarkable. Adrenals/Urinary Tract: Negative adrenals. No hydronephrosis or detectable stone (there is early contrast excretion from test bolus). Unremarkable bladder. Stomach/Bowel: No obstruction. Sigmoid diverticulosis. Appendectomy. Lymphatic: No mass or adenopathy. Reproductive:Hysterectomy. Other: No ascites or pneumoperitoneum. Musculoskeletal: Spondylosis and disc degeneration with levoscoliosis. Review of the MIP images confirms the above findings. IMPRESSION: 1. Negative for aortic dissection or other acute finding. 2. Overall mild atherosclerosis. Electronically Signed   By: Monte Fantasia M.D.   On: 07/16/2018 04:32    ____________________________________________   PROCEDURES  Procedure(s) performed:   Procedures  CRITICAL CARE Performed by: Merrily Pew Total critical care time: 32 minutes Critical care time was exclusive of separately billable procedures and treating other patients. Critical care was necessary to treat or prevent imminent or life-threatening deterioration. Critical care was time spent personally by me on the following activities: development of treatment plan  with patient and/or surrogate as well as nursing, discussions with consultants, evaluation of patient's response to treatment, examination of patient, obtaining history from patient or surrogate, ordering and performing treatments and interventions, ordering and review of laboratory studies, ordering and review of radiographic studies, pulse oximetry and re-evaluation of patient's condition.  ____________________________________________   INITIAL IMPRESSION / ASSESSMENT AND PLAN / ED COURSE  ACS vs Dissection vs pancreatitis vs cholecystitis or GI perforation. eval for same. Symptomatic treatment.   Workup ok. Pain improved. HTN. Will d/w medicine for admission for further observation, ACS rule out. Patient refusing aspirin. Hydralazine given for possible HTN urgency.   Pertinent labs & imaging results that were available during my care of the patient were reviewed by me and considered in my medical decision making (see chart for details).   ____________________________________________  FINAL CLINICAL IMPRESSION(S) / ED DIAGNOSES  Final diagnoses:  None     MEDICATIONS GIVEN DURING THIS VISIT:  Medications  morphine 2 MG/ML injection 2 mg (has no administration in time range)  hydrALAZINE (APRESOLINE) injection 5 mg (has  no administration in time range)  nitroGLYCERIN (NITROSTAT) SL tablet 0.4 mg (has no administration in time range)  fentaNYL (SUBLIMAZE) injection 100 mcg (100 mcg Intravenous Given 07/16/18 0343)  prochlorperazine (COMPAZINE) injection 10 mg (10 mg Intravenous Given 07/16/18 0424)  iohexol (OMNIPAQUE) 350 MG/ML injection 100 mL (100 mLs Intravenous Contrast Given 07/16/18 0404)  sodium chloride (PF) 0.9 % injection (  Given by Other 07/16/18 0434)  hydrALAZINE (APRESOLINE) injection 5 mg (5 mg Intravenous Given 07/16/18 0518)  HYDROmorphone (DILAUDID) injection 1 mg (1 mg Intravenous Given 07/16/18 0532)     NEW OUTPATIENT MEDICATIONS STARTED DURING THIS VISIT:   New Prescriptions   No medications on file    Note:  This note was prepared with assistance of Dragon voice recognition software. Occasional wrong-word or sound-a-like substitutions may have occurred due to the inherent limitations of voice recognition software.   Matheu Ploeger, Corene Cornea, MD 07/16/18 [phone]

## 2018-07-17 ENCOUNTER — Other Ambulatory Visit: Payer: Self-pay | Admitting: Cardiology

## 2018-07-17 ENCOUNTER — Observation Stay (HOSPITAL_COMMUNITY): Payer: Medicare Other

## 2018-07-17 DIAGNOSIS — R079 Chest pain, unspecified: Secondary | ICD-10-CM

## 2018-07-17 DIAGNOSIS — J4540 Moderate persistent asthma, uncomplicated: Secondary | ICD-10-CM | POA: Diagnosis not present

## 2018-07-17 DIAGNOSIS — I160 Hypertensive urgency: Secondary | ICD-10-CM | POA: Diagnosis not present

## 2018-07-17 LAB — CBC WITH DIFFERENTIAL/PLATELET
Abs Immature Granulocytes: 0.02 10*3/uL (ref 0.00–0.07)
Basophils Absolute: 0 10*3/uL (ref 0.0–0.1)
Basophils Relative: 0 %
Eosinophils Absolute: 0.1 10*3/uL (ref 0.0–0.5)
Eosinophils Relative: 1 %
HCT: 48.8 % — ABNORMAL HIGH (ref 36.0–46.0)
Hemoglobin: 15 g/dL (ref 12.0–15.0)
Immature Granulocytes: 0 %
Lymphocytes Relative: 17 %
Lymphs Abs: 1.8 10*3/uL (ref 0.7–4.0)
MCH: 28.7 pg (ref 26.0–34.0)
MCHC: 30.7 g/dL (ref 30.0–36.0)
MCV: 93.3 fL (ref 80.0–100.0)
Monocytes Absolute: 0.8 10*3/uL (ref 0.1–1.0)
Monocytes Relative: 8 %
Neutro Abs: 7.9 10*3/uL — ABNORMAL HIGH (ref 1.7–7.7)
Neutrophils Relative %: 74 %
Platelets: 221 10*3/uL (ref 150–400)
RBC: 5.23 MIL/uL — ABNORMAL HIGH (ref 3.87–5.11)
RDW: 14.6 % (ref 11.5–15.5)
WBC: 10.7 10*3/uL — ABNORMAL HIGH (ref 4.0–10.5)
nRBC: 0 % (ref 0.0–0.2)

## 2018-07-17 LAB — RENAL FUNCTION PANEL
Albumin: 3.5 g/dL (ref 3.5–5.0)
Anion gap: 12 (ref 5–15)
BUN: 9 mg/dL (ref 8–23)
CO2: 26 mmol/L (ref 22–32)
Calcium: 8.4 mg/dL — ABNORMAL LOW (ref 8.9–10.3)
Chloride: 98 mmol/L (ref 98–111)
Creatinine, Ser: 0.79 mg/dL (ref 0.44–1.00)
GFR calc Af Amer: >60 mL/min (ref >60)
GFR calc non Af Amer: >60 mL/min (ref >60)
Glucose, Bld: 101 mg/dL — ABNORMAL HIGH (ref 70–99)
Phosphorus: 2.8 mg/dL (ref 2.5–4.6)
Potassium: 3.7 mmol/L (ref 3.5–5.1)
Sodium: 136 mmol/L (ref 135–145)

## 2018-07-17 LAB — HIV ANTIBODY (ROUTINE TESTING W REFLEX): HIV Screen 4th Generation wRfx: NONREACTIVE

## 2018-07-17 MED ORDER — HYDRALAZINE HCL 25 MG PO TABS: 25.0000 mg | ORAL_TABLET | Freq: Three times a day (TID) | ORAL | 1 refills | Status: AC

## 2018-07-17 MED ORDER — NITROGLYCERIN 0.4 MG SL SUBL: 0.4000 mg | SUBLINGUAL_TABLET | SUBLINGUAL | 1 refills | Status: AC | PRN

## 2018-07-17 MED ORDER — TECHNETIUM TC 99M TETROFOSMIN IV KIT
30.0000 | PACK | Freq: Once | INTRAVENOUS | Status: AC | PRN
  Administered 2018-07-17: 30 via INTRAVENOUS

## 2018-07-17 MED ORDER — AMLODIPINE BESYLATE 5 MG PO TABS: 5.0000 mg | ORAL_TABLET | Freq: Every day | ORAL | 1 refills | Status: AC

## 2018-07-17 MED ORDER — REGADENOSON 0.4 MG/5ML IV SOLN
0.4000 mg | Freq: Once | INTRAVENOUS | Status: AC
  Administered 2018-07-17: 0.4 mg via INTRAVENOUS
  Filled 2018-07-17: qty 5

## 2018-07-17 NOTE — Progress Notes (Signed)
Patient being transported to [facility] for nuclear stress test via [facility].  Dr. Marylyn Ishihara gave order patient may be transported without telemetry for procedure.

## 2018-07-17 NOTE — Discharge Summary (Signed)
. Physician Discharge Summary  Sheila Tucker OMV:[id number] DOB: 01/14/1950 DOA: 07/16/2018  PCP: Shawnee Knapp, MD  Admit date: 07/16/2018 Discharge date: 07/17/2018  Admitted From: Home Disposition:  Discharged to home  Recommendations for Outpatient Follow-up:  1. Follow up with PCP in 1-2 weeks 2. Follow up with Cardiology as scheduled.   Discharge Condition: Stable  CODE STATUS: FULL    Brief/Interim Summary: Sheila Tucker a 68 y.o.femalewith medical history significant ofhypertension (patient denies this diagnosis), hyperlipidemia, asthma, GERD, lymphocytic colitis, remote uterine cancer (s/p ofhysterectomy, no radiation or chemotherapy), who presents with back pain or chest pain.  Patient states that she started having acute onset back pain at about 9:30 PM, which is located in the upper back between shoulder blades, constant, severe, sharp, 10 out of 10 in severity, radiating to the left side of the chest. The chest pain is 8 out of 10 in severity initially, currently 3 out of 10 in severity, sharp, pleuritic, aggravated by deep breaths. Patient denies cough or shortness of breath. No fever or chills. Patient has diaphoresis per report. She has vomited once with nonbiliary nonbloody vomitus. Currently no nausea,vomiting, diarrhea or abdominal pain. Denies symptoms of UTI or unilateral weakness.  Discharge Diagnoses:  Principal Problem:   Chest pain Active Problems:   HYPERLIPIDEMIA   Asthma   Hypertensive urgency   Back pain  Chest pain     - Etiology is not clear.       - CT angiogram is negative for dissection.       - Initial troponin negative.       - Chest x-ray negative.       - No fever or leukocytosis for pneumonia.       - COVID-19 negative.       - Blood pressure is elevated 196/107, indicating possible demand ischemia.     - Tele bed for obs     - prn Nitroglycerin, Morphine; pt can not tolerate ASA     - cards consulted; echo w/ EF 65%; sent for  stress test, but unable to complete     - she does not qualify for cardiac cath; cardiology has no further workup for this hospitalization; they will follow her outpatient  HLD     - not taking meds     - T[place] 152, HDL 45, LDL 94  Asthma     - stable     - Dulera, albuterol inhaler  Hypertensive urgency     - Patient states that she does not have history of hypertension though HTN is on her med list.  She states that her blood           - at admission, her blood pressure was 196/107.     - remains persistently elevated; start amlodipine, hydralazine     - BP better this AM on this combination; continue at discharge  Back pain     - Etiology is not clear     - CT angiogram is negative for dissection.       - Possibly due to musculoskeletal pain or muscle straining.     - PRN Tylenol, percocet, Robaxin   Discharge Instructions   Allergies as of 07/17/2018      Reactions   Tetanus Toxoid    REACTION: FEVER AND SWELLING   Asa [aspirin]    Nsaids Diarrhea      Medication List    TAKE these medications   amLODipine 5 MG tablet Commonly known  as: NORVASC Take 1 tablet (5 mg total) by mouth daily.   budesonide-formoterol 160-4.5 MCG/ACT inhaler Commonly known as: Symbicort Inhale 2 puffs into the lungs 2 (two) times daily.   famotidine 40 MG tablet Commonly known as: Pepcid Take 1 tablet (40 mg total) by mouth 2 (two) times daily.   fluticasone 50 MCG/ACT nasal spray Commonly known as: FLONASE SHAKE LIQUID AND USE 2 SPRAYS IN EACH NOSTRIL AT BEDTIME What changed:   how much to take  how to take this  when to take this  reasons to take this  additional instructions   hydrALAZINE 25 MG tablet Commonly known as: APRESOLINE Take 1 tablet (25 mg total) by mouth every 8 (eight) hours.   hydroxypropyl methylcellulose / hypromellose 2.5 % ophthalmic solution Commonly known as: ISOPTO TEARS / GONIOVISC Place 1 drop into both eyes 3 (three) times daily as  needed for dry eyes.   lidocaine 5 % ointment Commonly known as: XYLOCAINE Apply 1 application topically as needed.   nitroGLYCERIN 0.4 MG SL tablet Commonly known as: NITROSTAT Place 1 tablet (0.4 mg total) under the tongue every 5 (five) minutes as needed for up to 40 doses for chest pain.   Turmeric 400 MG Caps Take 400 mg by mouth daily.   Ventolin HFA 108 (90 Base) MCG/ACT inhaler Generic drug: albuterol INHALE 2 PUFFS INTO THE LUNGS EVERY 4 HOURS AS NEEDED FOR WHEEZING OR SHORTNESS OF BREATH What changed:   how much to take  how to take this  when to take this  reasons to take this  additional instructions       Allergies  Allergen Reactions  . Tetanus Toxoid     REACTION: FEVER AND SWELLING  . Asa [Aspirin]   . Nsaids Diarrhea    Consultations:  Cardiology   Procedures/Studies: Dg Chest Portable 1 View  Result Date: 07/16/2018 CLINICAL DATA:  68 year old female with pain radiating from the chest to between the shoulder blades since 20/2 100 hours. Diaphoresis. EXAM: PORTABLE CHEST 1 VIEW COMPARISON:  Chest radiographs 03/23/2016 and earlier. FINDINGS: Portable AP semi upright view at 0341 hours. Mediastinal contours are stable and within normal limits. Visualized tracheal air column is within normal limits. Stable lung volumes. Allowing for portable technique the lungs are clear. No pneumothorax. Degenerative changes at both shoulders. No acute osseous abnormality identified. Paucity of bowel gas in the upper abdomen. IMPRESSION: No acute cardiopulmonary abnormality. Electronically Signed   By: Genevie Ann M.D.   On: 07/16/2018 03:57   Ct Angio Chest/abd/pel For Dissection W And/or Wo Contrast  Result Date: 07/16/2018 CLINICAL DATA:  "Aortic dissection, known, follow-up". Chart history of back pain extending between the shoulder blades. EXAM: CT ANGIOGRAPHY CHEST, ABDOMEN AND PELVIS TECHNIQUE: Multidetector CT imaging through the chest, abdomen and pelvis was  performed using the standard protocol during bolus administration of intravenous contrast. Multiplanar reconstructed images and MIPs were obtained and reviewed to evaluate the vascular anatomy. CONTRAST:  174mL OMNIPAQUE IOHEXOL 350 MG/ML SOLN COMPARISON:  None. FINDINGS: CTA CHEST FINDINGS Cardiovascular: Noncontrast phase shows no intramural hematoma. Normal heart size. Negative for aortic dissection flap. Four vessel arch branching without superimposed abnormality. Enhancing proximal pulmonary arteries. Mediastinum/Nodes: Negative for hematoma or pneumomediastinum Lungs/Pleura: The central airways are clear. Mild atelectasis or scarring in the lower lungs. There is no edema, consolidation, effusion, or pneumothorax. Musculoskeletal: Prominent spondylosis with multi-level ankylosis. Advanced glenohumeral osteoarthritis. Review of the MIP images confirms the above findings. CTA ABDOMEN AND PELVIS FINDINGS VASCULAR Aorta:  Mild atheromatous plaque.  No aneurysm or dissection. Celiac: Smooth and widely patent branches. SMA: Smooth and widely patent branches Renals: Single bilateral renal artery. Asymmetric less dense left main renal artery is attributed to streak artifact - downstream vessels are symmetrically enhancing and so is the renal parenchyma. There is no history of flank pain. Patient was scanned with the arms down. IMA: Patent Inflow: Mild atherosclerotic plaque without dissection flap or aneurysm. Veins: Unremarkable in the arterial phase Review of the MIP images confirms the above findings. NON-VASCULAR Hepatobiliary: Cystic density in the deep left lobe liver.No evidence of biliary obstruction or stone. Pancreas: Generalized fatty infiltration Spleen: Unremarkable. Adrenals/Urinary Tract: Negative adrenals. No hydronephrosis or detectable stone (there is early contrast excretion from test bolus). Unremarkable bladder. Stomach/Bowel: No obstruction. Sigmoid diverticulosis. Appendectomy. Lymphatic: No mass  or adenopathy. Reproductive:Hysterectomy. Other: No ascites or pneumoperitoneum. Musculoskeletal: Spondylosis and disc degeneration with levoscoliosis. Review of the MIP images confirms the above findings. IMPRESSION: 1. Negative for aortic dissection or other acute finding. 2. Overall mild atherosclerosis. Electronically Signed   By: Monte Fantasia M.D.   On: 07/16/2018 04:32       Subjective: "I'm here. No chest pain."  Discharge Exam: Vitals:   07/17/18 0920 07/17/18 0922  BP: (!) 148/87 (!) 141/83  Pulse:    Resp:    Temp:    SpO2:     Vitals:   07/17/18 0840 07/17/18 0918 07/17/18 0920 07/17/18 0922  BP: (!) 150/78 (!) 155/91 (!) 148/87 (!) 141/83  Pulse:      Resp:      Temp:      TempSrc:      SpO2:      Weight:      Height:        General: 68 y.o. female resting in bed in NAD Cardiovascular: RRR, +S1, S2, no m/g/r, equal pulses throughout Respiratory: CTABL, no w/r/r, normal WOB GI: BS+, NDNT, no masses noted, no organomegaly noted, obese MSK: No e/c/c Skin: No rashes, bruises, ulcerations noted Neuro: A&O x 3, no focal deficits Psyc: Appropriate interaction and affect, calm/cooperative   The results of significant diagnostics from this hospitalization (including imaging, microbiology, ancillary and laboratory) are listed below for reference.     Microbiology: Recent Results (from the past 240 hour(s))  SARS Coronavirus 2 (CEPHEID - Performed in [facility] hospital lab), Hosp Order     Status: None   Collection Time: 07/16/18  3:52 AM   Specimen: Nasopharyngeal Swab  Result Value Ref Range Status   SARS Coronavirus 2 NEGATIVE NEGATIVE Final    Comment: (NOTE) If result is NEGATIVE SARS-CoV-2 target nucleic acids are NOT DETECTED. The SARS-CoV-2 RNA is generally detectable in upper and lower  respiratory specimens during the acute phase of infection. The lowest  concentration of SARS-CoV-2 viral copies this assay can detect is 250  copies / mL. A  negative result does not preclude SARS-CoV-2 infection  and should not be used as the sole basis for treatment or other  patient management decisions.  A negative result may occur with  improper specimen collection / handling, submission of specimen other  than nasopharyngeal swab, presence of viral mutation(s) within the  areas targeted by this assay, and inadequate number of viral copies  (<250 copies / mL). A negative result must be combined with clinical  observations, patient history, and epidemiological information. If result is POSITIVE SARS-CoV-2 target nucleic acids are DETECTED. The SARS-CoV-2 RNA is generally detectable in upper and lower  respiratory specimens dur ing the acute phase of infection.  Positive  results are indicative of active infection with SARS-CoV-2.  Clinical  correlation with patient history and other diagnostic information is  necessary to determine patient infection status.  Positive results do  not rule out bacterial infection or co-infection with other viruses. If result is PRESUMPTIVE POSTIVE SARS-CoV-2 nucleic acids MAY BE PRESENT.   A presumptive positive result was obtained on the submitted specimen  and confirmed on repeat testing.  While 2019 novel coronavirus  (SARS-CoV-2) nucleic acids may be present in the submitted sample  additional confirmatory testing may be necessary for epidemiological  and / or clinical management purposes  to differentiate between  SARS-CoV-2 and other Sarbecovirus currently known to infect humans.  If clinically indicated additional testing with an alternate test  methodology [phone]) is advised. The SARS-CoV-2 RNA is generally  detectable in upper and lower respiratory sp ecimens during the acute  phase of infection. The expected result is Negative. Fact Sheet for Patients:  StrictlyIdeas.no Fact Sheet for Healthcare Providers: BankingDealers.co.za This test is not  yet approved or cleared by the [place] FDA and has been authorized for detection and/or diagnosis of SARS-CoV-2 by FDA under an Emergency Use Authorization (EUA).  This EUA will remain in effect (meaning this test can be used) for the duration of the COVID-19 declaration under Section 564(b)(1) of the Act, 21 U.S.C. section 360bbb-3(b)(1), unless the authorization is terminated or revoked sooner. Performed at [facility], [place] [address] [place], [place] [address]      Labs: BNP (last 3 results) No results for input(s): BNP in the last 8760 hours. Basic Metabolic Panel: Recent Labs  Lab 07/16/18 0335 07/16/18 0348 07/16/18 0349  NA 143 143 144  K 4.0 4.0 4.0  CL 106  --  105  CO2 25  --   --   GLUCOSE 147*  --  130*  BUN 10  --  10  CREATININE 0.81  --  0.90  0.80  CALCIUM 8.8*  --   --    Liver Function Tests: Recent Labs  Lab 07/16/18 0335  AST 23  ALT 24  ALKPHOS 67  BILITOT 0.3  PROT 7.5  ALBUMIN 3.9   Recent Labs  Lab 07/16/18 0335  LIPASE 23   No results for input(s): AMMONIA in the last 168 hours. CBC: Recent Labs  Lab 07/16/18 0335 07/16/18 0348 07/16/18 0349  WBC 8.3  --   --   NEUTROABS 6.5  --   --   HGB 15.3* 16.7* 17.0*  HCT 49.0* 49.0* 50.0*  MCV 91.4  --   --   PLT 240  --   --    Cardiac Enzymes: No results for input(s): CKTOTAL, CKMB, CKMBINDEX, TROPONINI in the last 168 hours. BNP: Invalid input(s): POCBNP CBG: No results for input(s): GLUCAP in the last 168 hours. D-Dimer No results for input(s): DDIMER in the last 72 hours. Hgb A1c Recent Labs    07/16/18 0927  HGBA1C 5.8*   Lipid Profile Recent Labs    07/16/18 0927  CHOL 152  HDL 45  LDLCALC 94  TRIG 64  CHOLHDL 3.4   Thyroid function studies No results for input(s): TSH, T4TOTAL, T3FREE, THYROIDAB in the last 72 hours.  Invalid input(s): FREET3 Anemia work up No results for input(s): VITAMINB12, FOLATE, FERRITIN, TIBC, IRON,  RETICCTPCT in the last 72 hours. Urinalysis    Component Value Date/Time   BILIRUBINUR negative 05/30/2017 1535  BILIRUBINUR small 06/08/2016 1429   KETONESUR negative 05/30/2017 1535   PROTEINUR negative 05/30/2017 1535   PROTEINUR 100 06/08/2016 1429   UROBILINOGEN 0.2 05/30/2017 1535   NITRITE Negative 05/30/2017 1535   NITRITE positive 06/08/2016 1429   LEUKOCYTESUR Large (3+) (A) 05/30/2017 1535   Sepsis Labs Invalid input(s): PROCALCITONIN,  WBC,  LACTICIDVEN Microbiology Recent Results (from the past 240 hour(s))  SARS Coronavirus 2 (CEPHEID - Performed in [place] hospital lab), Hosp Order     Status: None   Collection Time: 07/16/18  3:52 AM   Specimen: Nasopharyngeal Swab  Result Value Ref Range Status   SARS Coronavirus 2 NEGATIVE NEGATIVE Final    Comment: (NOTE) If result is NEGATIVE SARS-CoV-2 target nucleic acids are NOT DETECTED. The SARS-CoV-2 RNA is generally detectable in upper and lower  respiratory specimens during the acute phase of infection. The lowest  concentration of SARS-CoV-2 viral copies this assay can detect is 250  copies / mL. A negative result does not preclude SARS-CoV-2 infection  and should not be used as the sole basis for treatment or other  patient management decisions.  A negative result may occur with  improper specimen collection / handling, submission of specimen other  than nasopharyngeal swab, presence of viral mutation(s) within the  areas targeted by this assay, and inadequate number of viral copies  (<250 copies / mL). A negative result must be combined with clinical  observations, patient history, and epidemiological information. If result is POSITIVE SARS-CoV-2 target nucleic acids are DETECTED. The SARS-CoV-2 RNA is generally detectable in upper and lower  respiratory specimens dur ing the acute phase of infection.  Positive  results are indicative of active infection with SARS-CoV-2.  Clinical  correlation with  patient history and other diagnostic information is  necessary to determine patient infection status.  Positive results do  not rule out bacterial infection or co-infection with other viruses. If result is PRESUMPTIVE POSTIVE SARS-CoV-2 nucleic acids MAY BE PRESENT.   A presumptive positive result was obtained on the submitted specimen  and confirmed on repeat testing.  While 2019 novel coronavirus  (SARS-CoV-2) nucleic acids may be present in the submitted sample  additional confirmatory testing may be necessary for epidemiological  and / or clinical management purposes  to differentiate between  SARS-CoV-2 and other Sarbecovirus currently known to infect humans.  If clinically indicated additional testing with an alternate test  methodology [phone]) is advised. The SARS-CoV-2 RNA is generally  detectable in upper and lower respiratory sp ecimens during the acute  phase of infection. The expected result is Negative. Fact Sheet for Patients:  StrictlyIdeas.no Fact Sheet for Healthcare Providers: BankingDealers.co.za This test is not yet approved or cleared by the [place] FDA and has been authorized for detection and/or diagnosis of SARS-CoV-2 by FDA under an Emergency Use Authorization (EUA).  This EUA will remain in effect (meaning this test can be used) for the duration of the COVID-19 declaration under Section 564(b)(1) of the Act, 21 U.S.C. section 360bbb-3(b)(1), unless the authorization is terminated or revoked sooner. Performed at [facility], [place] [address], [place],  [address]      Time coordinating discharge: 35 minutes  SIGNED:   Jonnie Finner, DO  [facility] 07/17/2018, 10:20 AM Pager   If 7PM-7AM, please contact night-coverage [email or web address] Password [email or web address]

## 2018-07-17 NOTE — Progress Notes (Signed)
Lexiscan portion of NUC med study complete. Patient in no distress.

## 2018-07-17 NOTE — Progress Notes (Signed)
Paged by nuc med nurse as patient cannot complete the stress image. She is unable to go under the scanner as she had shoulder issue and cannot raise her arm. She also is quite claustrophobic and cannot be strapped down. I offered her some light sedation, however she says she cannot try to go under the scanner unless she is completely knocked out. I discussed the issue with Dr. Radford Pax, there is no alternative at this point. She is aware there is no other alternative as well. She tried the strap briefly but could not tolerate it. She is unwilling to try it again. She does not qualify for cardiac catheterization at this time.   After discussing with Dr. Radford Pax, we planned to followup with Sheila Tucker as outpatient closely to monitor her symptom. Sheila Tucker has been informed as well. No further cardiac workup is planned during this hospitalization. Echocardiogram was normal.

## 2018-07-17 NOTE — Progress Notes (Signed)
Patient returned from procedure.  Patient unable to complete.  Dr. Marylyn Ishihara notified and gave order for patient to have Heart Healthy Diet.

## 2018-07-17 NOTE — Progress Notes (Signed)
Patient's IVs removed.  Both sites WNL.  AVS reviewed with patient.  Verbalized understanding of discharge instructions, physician follow-up, medications.  Patient transported by NT via wheelchair to main entrance at discharge.  Patient reports belongings in possession at time of discharge.  Patient stable at time of discharge.

## 2018-07-17 NOTE — Progress Notes (Signed)
Outpatient stress test has been ordered to take place at our [facility] location. I've discussed with staff. Given her weight she will need to be a 2 day study.

## 2018-07-18 ENCOUNTER — Other Ambulatory Visit: Payer: Self-pay | Admitting: Internal Medicine

## 2018-07-18 ENCOUNTER — Telehealth (HOSPITAL_COMMUNITY): Payer: Self-pay | Admitting: Radiology

## 2018-07-18 ENCOUNTER — Ambulatory Visit (HOSPITAL_COMMUNITY): Payer: Medicare Other

## 2018-07-18 NOTE — Telephone Encounter (Signed)
Patient given detailed instructions per Myocardial Perfusion Study Information Sheet for the test on 07/25/2018 at 11:15. Patient notified to arrive 15 minutes early and that it is imperative to arrive on time for appointment to keep from having the test rescheduled.  If you need to cancel or reschedule your appointment, please call the office within 24 hours of your appointment. . Patient verbalized understanding.EK

## 2018-07-19 ENCOUNTER — Telehealth (HOSPITAL_COMMUNITY): Payer: Self-pay | Admitting: *Deleted

## 2018-07-19 NOTE — Telephone Encounter (Signed)
Patient given detailed instructions per Myocardial Perfusion Study Information Sheet for the test on 07/25/18. Patient notified to arrive 15 minutes early and that it is imperative to arrive on time for appointment to keep from having the test rescheduled.  If you need to cancel or reschedule your appointment, please call the office within 24 hours of your appointment. . Patient verbalized understanding. Sheila Tucker

## 2018-07-25 ENCOUNTER — Other Ambulatory Visit: Payer: Self-pay

## 2018-07-25 ENCOUNTER — Ambulatory Visit (HOSPITAL_COMMUNITY): Payer: Medicare Other | Attending: Interventional Cardiology

## 2018-07-25 DIAGNOSIS — R079 Chest pain, unspecified: Secondary | ICD-10-CM | POA: Insufficient documentation

## 2018-07-25 MED ORDER — TECHNETIUM TC 99M TETROFOSMIN IV KIT
33.0000 | PACK | Freq: Once | INTRAVENOUS | Status: AC | PRN
  Administered 2018-07-25: 33 via INTRAVENOUS
  Filled 2018-07-25: qty 33

## 2018-07-25 MED ORDER — REGADENOSON 0.4 MG/5ML IV SOLN
0.4000 mg | Freq: Once | INTRAVENOUS | Status: AC
  Administered 2018-07-25: 0.4 mg via INTRAVENOUS

## 2018-07-26 ENCOUNTER — Ambulatory Visit (HOSPITAL_COMMUNITY): Payer: Medicare Other | Attending: Internal Medicine

## 2018-07-26 LAB — MYOCARDIAL PERFUSION IMAGING
LV dias vol: 56 mL (ref 46–106)
LV sys vol: 22 mL
Peak HR: 85 {beats}/min
Rest HR: 83 {beats}/min
SDS: 0
SRS: 0
SSS: 0
TID: 0.93

## 2018-07-26 MED ORDER — TECHNETIUM TC 99M TETROFOSMIN IV KIT
31.8000 | PACK | Freq: Once | INTRAVENOUS | Status: AC | PRN
  Administered 2018-07-26: 31.8 via INTRAVENOUS
  Filled 2018-07-26: qty 32

## 2018-07-27 ENCOUNTER — Telehealth: Payer: Self-pay

## 2018-07-27 NOTE — Telephone Encounter (Signed)
-----   Message from Consuelo Pandy, [place] sent at 07/27/2018 12:22 PM EDT ----- Stress test is normal. No signs of ischemia (abnormal blood flow). Does not appear to have any major blockages. This is great! Pump function of heart is normal.

## 2018-07-27 NOTE — Telephone Encounter (Signed)
Notes recorded by Frederik Schmidt, RN on 07/27/2018 at 12:47 PM EDT  The patient has been notified of the result and verbalized understanding. All questions (if any) were answered.  Frederik Schmidt, RN 07/27/2018 12:47 PM   Patient would like to know if she can cancel appointment with Ermalinda Barrios 7/29?  ------

## 2018-07-27 NOTE — Telephone Encounter (Signed)
I spoke to the patient and she has agreed to cancel her upcoming appointment.  She will inform us if symptoms reoccur.

## 2018-08-01 ENCOUNTER — Ambulatory Visit: Payer: Medicare Other | Admitting: Physician Assistant

## 2018-08-07 DIAGNOSIS — Z23 Encounter for immunization: Secondary | ICD-10-CM | POA: Diagnosis not present

## 2018-08-07 DIAGNOSIS — M545 Low back pain: Secondary | ICD-10-CM | POA: Diagnosis not present

## 2018-08-08 DIAGNOSIS — M5442 Lumbago with sciatica, left side: Secondary | ICD-10-CM | POA: Diagnosis not present

## 2018-08-08 DIAGNOSIS — M5137 Other intervertebral disc degeneration, lumbosacral region: Secondary | ICD-10-CM | POA: Diagnosis not present

## 2018-08-08 DIAGNOSIS — M5117 Intervertebral disc disorders with radiculopathy, lumbosacral region: Secondary | ICD-10-CM | POA: Diagnosis not present

## 2018-08-08 DIAGNOSIS — M4150 Other secondary scoliosis, site unspecified: Secondary | ICD-10-CM | POA: Diagnosis not present

## 2018-08-08 DIAGNOSIS — M4310 Spondylolisthesis, site unspecified: Secondary | ICD-10-CM | POA: Diagnosis not present

## 2018-08-08 DIAGNOSIS — M47816 Spondylosis without myelopathy or radiculopathy, lumbar region: Secondary | ICD-10-CM | POA: Diagnosis not present

## 2018-08-08 DIAGNOSIS — M7918 Myalgia, other site: Secondary | ICD-10-CM | POA: Diagnosis not present

## 2018-08-08 DIAGNOSIS — G8929 Other chronic pain: Secondary | ICD-10-CM | POA: Diagnosis not present

## 2018-08-10 ENCOUNTER — Telehealth: Payer: Self-pay | Admitting: Internal Medicine

## 2018-08-10 NOTE — Telephone Encounter (Signed)
Patient is asking with her hx of lymphocytic colitis if she can take Celebrex and Tramadol.  Celebrex would be a long term tx, tramadol PRN.  She is advised we will let her know next week.

## 2018-08-14 NOTE — Telephone Encounter (Signed)
Patient notified of recommendations. 

## 2018-08-14 NOTE — Telephone Encounter (Signed)
NSAIDs have a high likelihood of triggering microscopic colitis -- Celebrex is an NSAID, thus, if at all possible I would recommend this be avoided given change of reactivating her microscopic colitis Tramadol does not have an association with microscopic colitis

## 2018-08-30 DIAGNOSIS — M7752 Other enthesopathy of left foot: Secondary | ICD-10-CM | POA: Diagnosis not present

## 2018-08-30 DIAGNOSIS — M25572 Pain in left ankle and joints of left foot: Secondary | ICD-10-CM | POA: Diagnosis not present

## 2018-10-12 DIAGNOSIS — M25572 Pain in left ankle and joints of left foot: Secondary | ICD-10-CM | POA: Diagnosis not present

## 2018-10-12 DIAGNOSIS — M65872 Other synovitis and tenosynovitis, left ankle and foot: Secondary | ICD-10-CM | POA: Diagnosis not present

## 2019-01-18 ENCOUNTER — Other Ambulatory Visit: Payer: Self-pay | Admitting: Family Medicine

## 2019-01-18 ENCOUNTER — Other Ambulatory Visit: Payer: Self-pay | Admitting: Internal Medicine

## 2019-01-18 DIAGNOSIS — J309 Allergic rhinitis, unspecified: Secondary | ICD-10-CM

## 2019-04-24 ENCOUNTER — Institutional Professional Consult (permissible substitution): Payer: Medicare Other | Admitting: Internal Medicine

## 2019-05-20 DIAGNOSIS — L97509 Non-pressure chronic ulcer of other part of unspecified foot with unspecified severity: Secondary | ICD-10-CM | POA: Diagnosis not present

## 2019-05-20 DIAGNOSIS — M79672 Pain in left foot: Secondary | ICD-10-CM | POA: Diagnosis not present

## 2019-05-20 DIAGNOSIS — M7752 Other enthesopathy of left foot: Secondary | ICD-10-CM | POA: Diagnosis not present

## 2019-05-20 DIAGNOSIS — M21622 Bunionette of left foot: Secondary | ICD-10-CM | POA: Diagnosis not present

## 2019-05-27 ENCOUNTER — Institutional Professional Consult (permissible substitution): Payer: Medicare Other | Admitting: Internal Medicine

## 2019-05-29 DIAGNOSIS — M79672 Pain in left foot: Secondary | ICD-10-CM | POA: Diagnosis not present

## 2019-06-10 DIAGNOSIS — M86672 Other chronic osteomyelitis, left ankle and foot: Secondary | ICD-10-CM | POA: Diagnosis not present

## 2019-06-21 ENCOUNTER — Telehealth: Payer: Self-pay | Admitting: Internal Medicine

## 2019-06-21 NOTE — Telephone Encounter (Signed)
See below

## 2019-06-21 NOTE — Telephone Encounter (Signed)
Pt states that she will be having her foot amputated next Tuesday, pt is concerned because she will be put on long term antibiotics and wants to make sure that this will not cause her lymphocytic colitis to flare up. Pt would like to know if there is anything that she can do prophylactically to prevent flare up? Please advise, thank you

## 2019-06-21 NOTE — Telephone Encounter (Signed)
Patient called stated she will be having a procedure with another provider. She is seeking advise due to the anesthesia that she will be given she is afraid that she will get GI issues.

## 2019-06-24 NOTE — Telephone Encounter (Signed)
You can let patient know that antibiotics are not associated with flare of lymphocytic colitis. Antibiotics can be associated with diarrhea from other causes such as direct side effect from antibiotics or even C. difficile infection I would not recommend any medication prophylactically for lymphocytic colitis but should she develop diarrhea she should let me know

## 2019-06-24 NOTE — Telephone Encounter (Signed)
Left detailed message for patient with Dr. Vena Rua recommendations, pt advised to call the office if she needed clarification or had any other concerns.

## 2019-06-25 DIAGNOSIS — L97424 Non-pressure chronic ulcer of left heel and midfoot with necrosis of bone: Secondary | ICD-10-CM | POA: Diagnosis not present

## 2019-06-25 DIAGNOSIS — M86672 Other chronic osteomyelitis, left ankle and foot: Secondary | ICD-10-CM | POA: Diagnosis not present

## 2019-08-08 DIAGNOSIS — I10 Essential (primary) hypertension: Secondary | ICD-10-CM | POA: Diagnosis not present

## 2019-08-13 DIAGNOSIS — G8929 Other chronic pain: Secondary | ICD-10-CM | POA: Diagnosis not present

## 2019-08-13 DIAGNOSIS — J4520 Mild intermittent asthma, uncomplicated: Secondary | ICD-10-CM | POA: Diagnosis not present

## 2019-08-13 DIAGNOSIS — K52832 Lymphocytic colitis: Secondary | ICD-10-CM | POA: Diagnosis not present

## 2019-08-13 DIAGNOSIS — M4125 Other idiopathic scoliosis, thoracolumbar region: Secondary | ICD-10-CM | POA: Diagnosis not present

## 2019-08-13 DIAGNOSIS — I10 Essential (primary) hypertension: Secondary | ICD-10-CM | POA: Diagnosis not present

## 2019-08-13 DIAGNOSIS — M545 Low back pain: Secondary | ICD-10-CM | POA: Diagnosis not present

## 2019-08-13 DIAGNOSIS — E782 Mixed hyperlipidemia: Secondary | ICD-10-CM | POA: Diagnosis not present

## 2019-08-13 DIAGNOSIS — Z0000 Encounter for general adult medical examination without abnormal findings: Secondary | ICD-10-CM | POA: Diagnosis not present

## 2019-08-13 DIAGNOSIS — K219 Gastro-esophageal reflux disease without esophagitis: Secondary | ICD-10-CM | POA: Diagnosis not present

## 2019-08-13 DIAGNOSIS — J309 Allergic rhinitis, unspecified: Secondary | ICD-10-CM | POA: Diagnosis not present

## 2019-09-10 IMAGING — CT CT ANGIO CHEST-ABD-PELV FOR DISSECTION W/ AND WO/W CM
2 of 8 series · 14 of 46 positions shown, 16 images · IV contrast (ISOVUE)
Comparison: None.

CLINICAL DATA: "Aortic dissection, known, follow-up". Chart history
of back pain extending between the shoulder blades.

EXAM:
CT ANGIOGRAPHY CHEST, ABDOMEN AND PELVIS
TECHNIQUE: Multidetector CT imaging through the chest, abdomen and pelvis was
performed using the standard protocol during bolus administration of
intravenous contrast. Multiplanar reconstructed images and MIPs were
obtained and reviewed to evaluate the vascular anatomy.
CONTRAST:  100mL OMNIPAQUE IOHEXOL 350 MG/ML SOLN

[Series 5: axial arterial · axial · arterial · 0.98mm/px · z∈[-628,-88]mm · 11 of 218 slices shown, 13 images]
[im 19/218  soft-tissue]
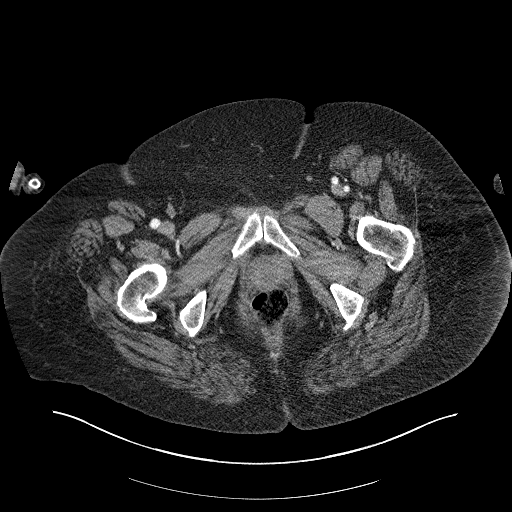
[im 19/218  bone]
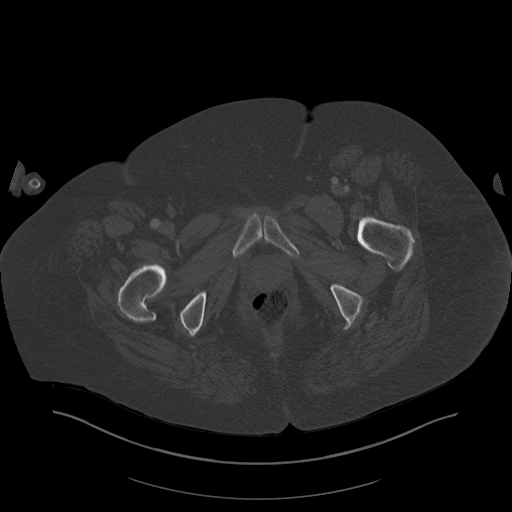
[im 37/218  soft-tissue]
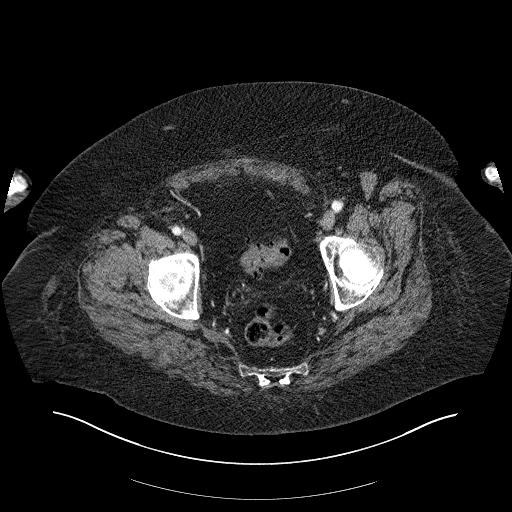
[im 55/218  soft-tissue]
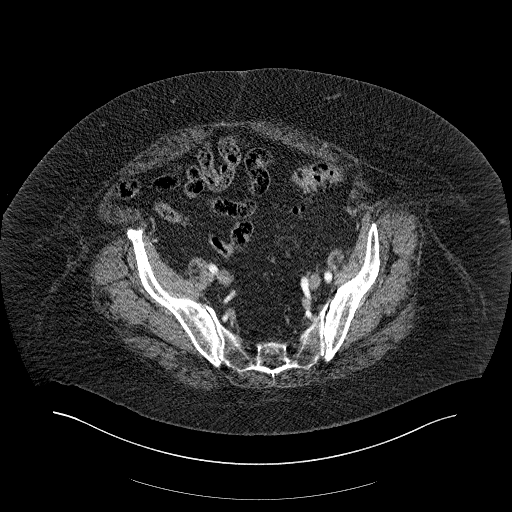
[im 73/218  soft-tissue]
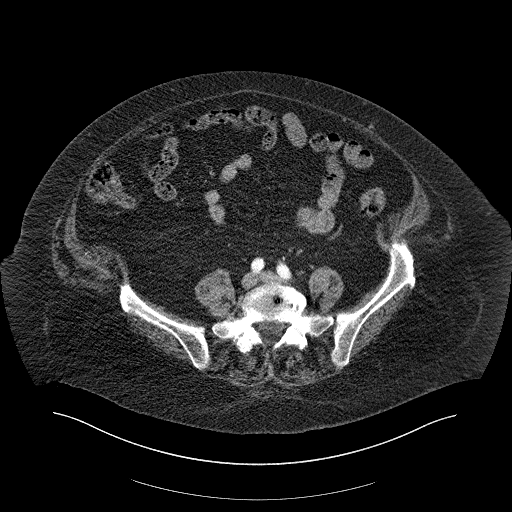
[im 91/218  soft-tissue]
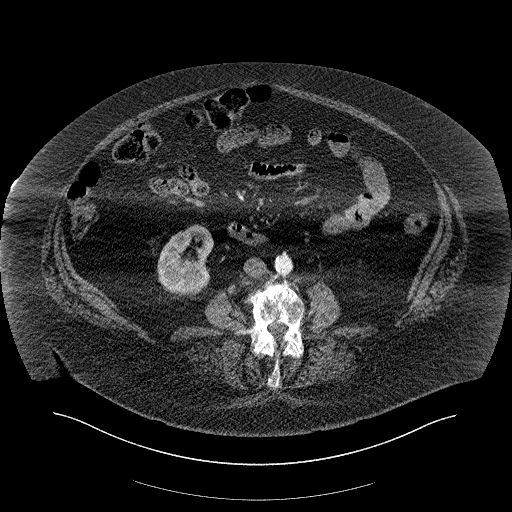
[im 109/218  soft-tissue]
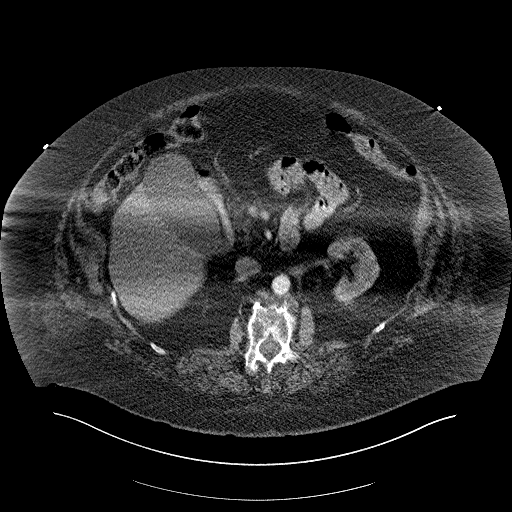
[im 127/218  soft-tissue]
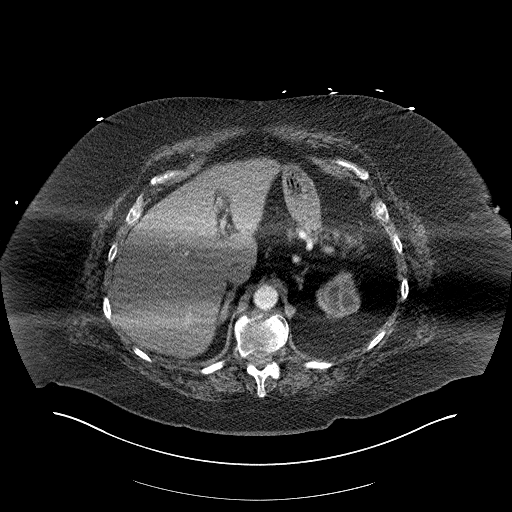
[im 145/218  soft-tissue]
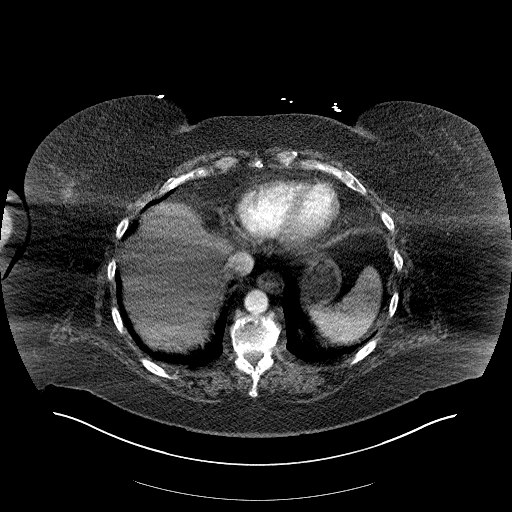
[im 163/218  soft-tissue]
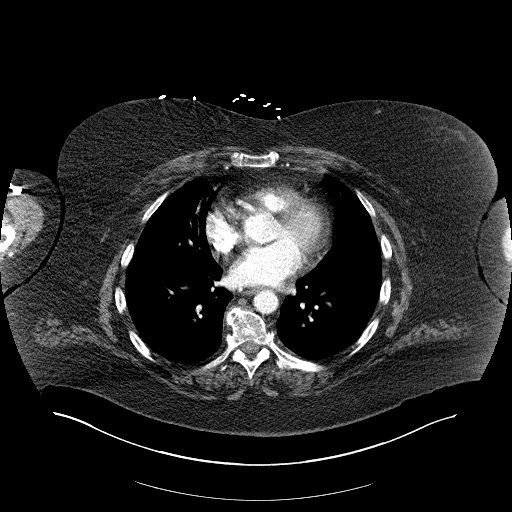
[im 163/218  bone]
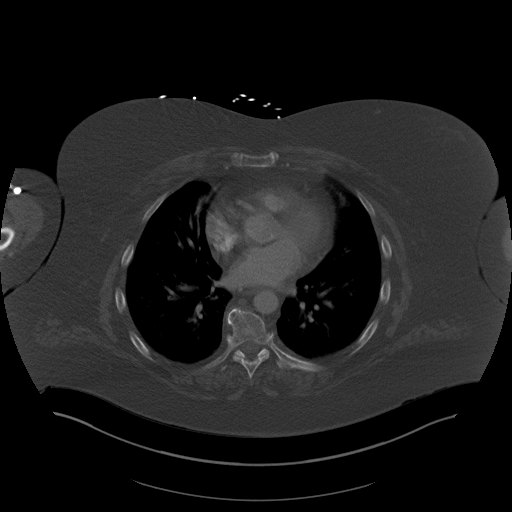
[im 181/218  soft-tissue]
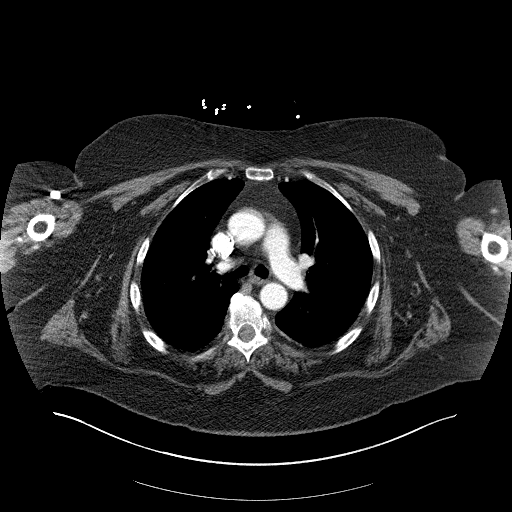
[im 199/218  soft-tissue]
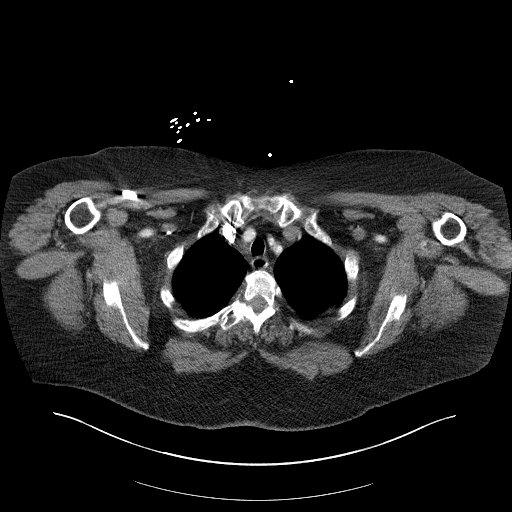

[Series 9: coronals · coronal · 1.04mm/px · 3 of 193 slices shown]
[im 49/193  soft-tissue]
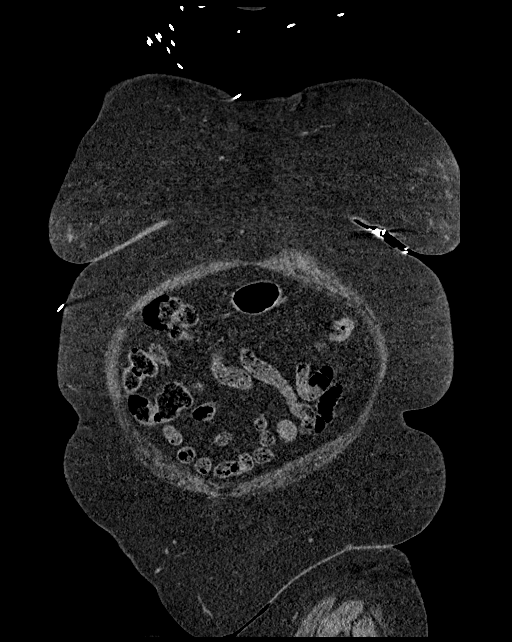
[im 97/193  soft-tissue]
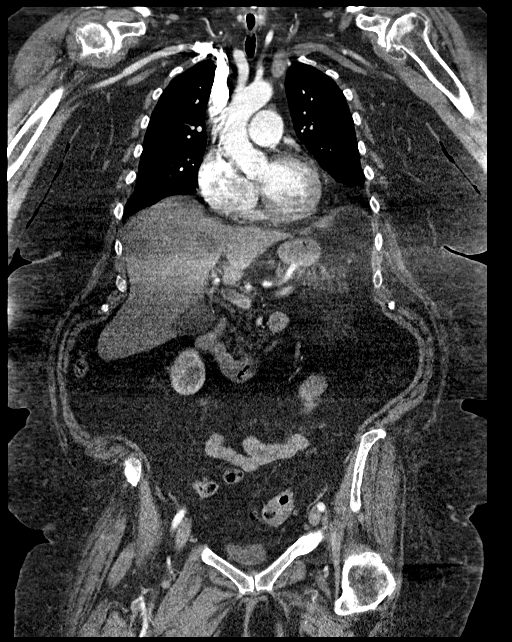
[im 145/193  soft-tissue]
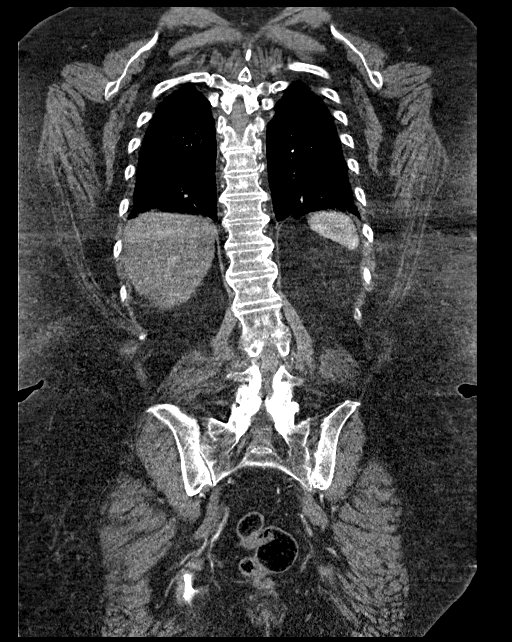

[14 of 46 positions shown; findings below may reference images not displayed]

FINDINGS: CTA CHEST FINDINGS

Cardiovascular: Noncontrast phase shows no intramural hematoma.
Normal heart size. Negative for aortic dissection flap. Four vessel
arch branching without superimposed abnormality. Enhancing proximal
pulmonary arteries.

Mediastinum/Nodes: Negative for hematoma or pneumomediastinum

Lungs/Pleura: The central airways are clear. Mild atelectasis or
scarring in the lower lungs. There is no edema, consolidation,
effusion, or pneumothorax.

Musculoskeletal: Prominent spondylosis with multi-level ankylosis.
Advanced glenohumeral osteoarthritis.

Review of the MIP images confirms the above findings.

CTA ABDOMEN AND PELVIS FINDINGS

VASCULAR

Aorta: Mild atheromatous plaque.  No aneurysm or dissection.

Celiac: Smooth and widely patent branches.

SMA: Smooth and widely patent branches

Renals: Single bilateral renal artery. Asymmetric less dense left
main renal artery is attributed to streak artifact - downstream
vessels are symmetrically enhancing and so is the renal parenchyma.
There is no history of flank pain. Patient was scanned with the arms
down.

IMA: Patent

Inflow: Mild atherosclerotic plaque without dissection flap or
aneurysm.

Veins: Unremarkable in the arterial phase

Review of the MIP images confirms the above findings.

NON-VASCULAR

Hepatobiliary: Cystic density in the deep left lobe liver.No
evidence of biliary obstruction or stone.

Pancreas: Generalized fatty infiltration

Spleen: Unremarkable.

Adrenals/Urinary Tract: Negative adrenals. No hydronephrosis or
detectable stone (there is early contrast excretion from test
bolus). Unremarkable bladder.

Stomach/Bowel: No obstruction. Sigmoid diverticulosis. Appendectomy.

Lymphatic: No mass or adenopathy.

Reproductive:Hysterectomy.

Other: No ascites or pneumoperitoneum.

Musculoskeletal: Spondylosis and disc degeneration with
levoscoliosis.

Review of the MIP images confirms the above findings.
IMPRESSION: 1. Negative for aortic dissection or other acute finding.
2. Overall mild atherosclerosis.

## 2019-10-14 ENCOUNTER — Other Ambulatory Visit: Payer: Self-pay

## 2019-10-14 ENCOUNTER — Other Ambulatory Visit: Payer: Self-pay | Admitting: Family Medicine

## 2019-10-14 DIAGNOSIS — J309 Allergic rhinitis, unspecified: Secondary | ICD-10-CM

## 2019-10-14 NOTE — Telephone Encounter (Signed)
Requested medication (s) are due for refill today: no  Requested medication (s) are on the active medication list: yes  Last refill:  07/10/19  Future visit scheduled: no  Notes to clinic:  no valid encounter within last 12 months    Requested Prescriptions  Pending Prescriptions Disp Refills   fluticasone (FLONASE) 50 MCG/ACT nasal spray [Pharmacy Med Name: FLUTICASONE 50MCG NASAL SP (120) RX] 16 g 1    Sig: SHAKE LIQUID AND USE 2 SPRAYS IN EACH NOSTRIL AT BEDTIME      Ear, Nose, and Throat: Nasal Preparations - Corticosteroids Failed - 10/14/2019 11:48 AM      Failed - Valid encounter within last 12 months    Recent Outpatient Visits           1 year ago Gastroesophageal reflux disease, esophagitis presence not specified   [facility], MD   2 years ago Essential hypertension   [facility], Laurey Arrow, MD   2 years ago Urine frequency   [facility], PA-C   2 years ago Urine frequency   [facility], Audrie Lia, PA-C   3 years ago Dysuria   [facility], Laurey Arrow, MD

## 2020-08-12 DIAGNOSIS — E782 Mixed hyperlipidemia: Secondary | ICD-10-CM | POA: Diagnosis not present

## 2020-08-12 DIAGNOSIS — I10 Essential (primary) hypertension: Secondary | ICD-10-CM | POA: Diagnosis not present

## 2020-08-17 DIAGNOSIS — Z23 Encounter for immunization: Secondary | ICD-10-CM | POA: Diagnosis not present

## 2020-08-17 DIAGNOSIS — J4520 Mild intermittent asthma, uncomplicated: Secondary | ICD-10-CM | POA: Diagnosis not present

## 2020-08-17 DIAGNOSIS — E782 Mixed hyperlipidemia: Secondary | ICD-10-CM | POA: Diagnosis not present

## 2020-08-17 DIAGNOSIS — Z0000 Encounter for general adult medical examination without abnormal findings: Secondary | ICD-10-CM | POA: Diagnosis not present

## 2020-08-17 DIAGNOSIS — K219 Gastro-esophageal reflux disease without esophagitis: Secondary | ICD-10-CM | POA: Diagnosis not present

## 2020-08-17 DIAGNOSIS — M5459 Other low back pain: Secondary | ICD-10-CM | POA: Diagnosis not present

## 2020-08-17 DIAGNOSIS — K52832 Lymphocytic colitis: Secondary | ICD-10-CM | POA: Diagnosis not present

## 2020-08-17 DIAGNOSIS — Z86010 Personal history of colonic polyps: Secondary | ICD-10-CM | POA: Diagnosis not present

## 2020-08-17 DIAGNOSIS — J309 Allergic rhinitis, unspecified: Secondary | ICD-10-CM | POA: Diagnosis not present

## 2020-08-17 DIAGNOSIS — I700 Atherosclerosis of aorta: Secondary | ICD-10-CM | POA: Diagnosis not present

## 2020-08-17 DIAGNOSIS — I10 Essential (primary) hypertension: Secondary | ICD-10-CM | POA: Diagnosis not present

## 2020-12-08 ENCOUNTER — Encounter: Payer: Self-pay | Admitting: Internal Medicine

## 2021-01-05 ENCOUNTER — Ambulatory Visit: Payer: Medicare Other

## 2021-01-05 ENCOUNTER — Other Ambulatory Visit: Payer: Self-pay

## 2021-01-05 VITALS — Ht 69.0 in | Wt 300.0 lb

## 2021-01-05 DIAGNOSIS — Z86010 Personal history of colonic polyps: Secondary | ICD-10-CM

## 2021-01-05 MED ORDER — PEG 3350-KCL-NA BICARB-NACL 420 G PO SOLR: 4000.0000 mL | Freq: Once | ORAL | 0 refills | Status: AC

## 2021-01-05 NOTE — Progress Notes (Signed)

## 2021-01-21 ENCOUNTER — Ambulatory Visit (AMBULATORY_SURGERY_CENTER): Payer: Medicare Other | Admitting: Internal Medicine

## 2021-01-21 ENCOUNTER — Encounter: Payer: Self-pay | Admitting: Internal Medicine

## 2021-01-21 VITALS — BP 146/91 | HR 85 | Temp 98.4°F | Resp 19 | Ht 69.0 in | Wt 300.0 lb

## 2021-01-21 DIAGNOSIS — D125 Benign neoplasm of sigmoid colon: Secondary | ICD-10-CM

## 2021-01-21 DIAGNOSIS — D122 Benign neoplasm of ascending colon: Secondary | ICD-10-CM

## 2021-01-21 DIAGNOSIS — D124 Benign neoplasm of descending colon: Secondary | ICD-10-CM | POA: Diagnosis not present

## 2021-01-21 DIAGNOSIS — D127 Benign neoplasm of rectosigmoid junction: Secondary | ICD-10-CM | POA: Diagnosis not present

## 2021-01-21 DIAGNOSIS — K219 Gastro-esophageal reflux disease without esophagitis: Secondary | ICD-10-CM | POA: Diagnosis not present

## 2021-01-21 DIAGNOSIS — J45909 Unspecified asthma, uncomplicated: Secondary | ICD-10-CM | POA: Diagnosis not present

## 2021-01-21 DIAGNOSIS — Z86010 Personal history of colonic polyps: Secondary | ICD-10-CM | POA: Diagnosis not present

## 2021-01-21 DIAGNOSIS — D120 Benign neoplasm of cecum: Secondary | ICD-10-CM

## 2021-01-21 DIAGNOSIS — D128 Benign neoplasm of rectum: Secondary | ICD-10-CM

## 2021-01-21 MED ORDER — SODIUM CHLORIDE 0.9 % IV SOLN: 500.0000 mL | Freq: Once | INTRAVENOUS | Status: DC

## 2021-01-21 NOTE — Patient Instructions (Signed)
YOU HAD AN ENDOSCOPIC PROCEDURE TODAY AT THE [facility]:   Refer to the procedure report that was given to you for any specific questions about what was found during the examination.  If the procedure report does not answer your questions, please call your gastroenterologist to clarify.  If you requested that your care partner not be given the details of your procedure findings, then the procedure report has been included in a sealed envelope for you to review at your convenience later. ° °**Handouts given on polyps and diverticulosis** ° °YOU SHOULD EXPECT: Some feelings of bloating in the abdomen. Passage of more gas than usual.  Walking can help get rid of the air that was put into your GI tract during the procedure and reduce the bloating. If you had a lower endoscopy (such as a colonoscopy or flexible sigmoidoscopy) you may notice spotting of blood in your stool or on the toilet paper. If you underwent a bowel prep for your procedure, you may not have a normal bowel movement for a few days. ° °Please Note:  You might notice some irritation and congestion in your nose or some drainage.  This is from the oxygen used during your procedure.  There is no need for concern and it should clear up in a day or so. ° °SYMPTOMS TO REPORT IMMEDIATELY: ° °Following lower endoscopy (colonoscopy or flexible sigmoidoscopy): ° Excessive amounts of blood in the stool ° Significant tenderness or worsening of abdominal pains ° Swelling of the abdomen that is new, acute ° Fever of 100°F or higher ° °For urgent or emergent issues, a gastroenterologist can be reached at any hour by calling [phone]. °Do not use MyChart messaging for urgent concerns.  ° ° °DIET:  We do recommend a small meal at first, but then you may proceed to your regular diet.  Drink plenty of fluids but you should avoid alcoholic beverages for 24 hours. ° °ACTIVITY:  You should plan to take it easy for the rest of today and you should NOT DRIVE  or use heavy machinery until tomorrow (because of the sedation medicines used during the test).   ° °FOLLOW UP: °Our staff will call the number listed on your records 48-72 hours following your procedure to check on you and address any questions or concerns that you may have regarding the information given to you following your procedure. If we do not reach you, we will leave a message.  We will attempt to reach you two times.  During this call, we will ask if you have developed any symptoms of COVID 19. If you develop any symptoms (ie: fever, flu-like symptoms, shortness of breath, cough etc.) before then, please call [phone].  If you test positive for Covid 19 in the 2 weeks post procedure, please call and report this information to us.   ° °If any biopsies were taken you will be contacted by phone or by letter within the next 1-3 weeks.  Please call us at [phone] if you have not heard about the biopsies in 3 weeks.  ° ° °SIGNATURES/CONFIDENTIALITY: °You and/or your care partner have signed paperwork which will be entered into your electronic medical record.  These signatures attest to the fact that that the information above on your After Visit Summary has been reviewed and is understood.  Full responsibility of the confidentiality of this discharge information lies with you and/or your care-partner.  °

## 2021-01-21 NOTE — Progress Notes (Signed)
Report given to PACU, vss 

## 2021-01-21 NOTE — Progress Notes (Signed)
GASTROENTEROLOGY PROCEDURE H&P NOTE   Primary Care Physician: Deland Pretty, MD    Reason for Procedure:  History of adenomatous colon polyps  Plan:    Colonoscopy  Patient is appropriate for endoscopic procedure(s) in the ambulatory ([facility]) setting.  The nature of the procedure, as well as the risks, benefits, and alternatives were carefully and thoroughly reviewed with the patient. Ample time for discussion and questions allowed. The patient understood, was satisfied, and agreed to proceed.     HPI: Sheila Tucker is a 71 y.o. female who presents for surveillance colonoscopy.  Medical history as below.  Tolerated the prep.  No recent chest pain or shortness of breath.  No abdominal pain today.  Past Medical History:  Diagnosis Date   Allergy    Asthma    Cancer ([facility])    uterine   Cataract    Diverticulosis    GERD (gastroesophageal reflux disease)    Hyperlipidemia    patient denies high lipids   Hypertension    past, under control, no meds   Lymphocytic colitis    Tubular adenoma of colon     Past Surgical History:  Procedure Laterality Date   ABDOMINAL HYSTERECTOMY  2003   & BSO for uterine cancer   APPENDECTOMY     COLONOSCOPY     tics and polyps (tubular adenomas), Neg in 2008   SHOULDER SURGERY  2004/2006   both shoulders, Dr Alphonzo Cruise   TONSILLECTOMY     UPPER GASTROINTESTINAL ENDOSCOPY  2003   esophagitis, gastritis, duodenitis    Prior to Admission medications   Medication Sig Start Date End Date Taking? Authorizing Provider  budesonide-formoterol (SYMBICORT) 160-4.5 MCG/ACT inhaler Inhale 2 puffs into the lungs 2 (two) times daily. 02/01/18  Yes Wendie Agreste, MD  famotidine (PEPCID) 40 MG tablet TAKE 1 TABLET(40 MG) BY MOUTH TWICE DAILY 07/18/18  Yes Ardeth Repetto, Lajuan Lines, MD  fluticasone (FLONASE) 50 MCG/ACT nasal spray SHAKE LIQUID AND USE 2 SPRAYS IN EACH NOSTRIL AT BEDTIME 10/14/19  Yes Wendie Agreste, MD  Turmeric 400 MG CAPS Take 400 mg by mouth  daily.   Yes [provider]  amLODipine (NORVASC) 5 MG tablet Take 1 tablet (5 mg total) by mouth daily. Patient not taking: Reported on 01/05/2021 07/17/18 09/15/18  Cherylann Ratel A, DO  hydrALAZINE (APRESOLINE) 25 MG tablet Take 1 tablet (25 mg total) by mouth every 8 (eight) hours. Patient not taking: Reported on 01/05/2021 07/17/18 09/15/18  Cherylann Ratel A, DO  hydroxypropyl methylcellulose / hypromellose (ISOPTO TEARS / GONIOVISC) 2.5 % ophthalmic solution Place 1 drop into both eyes 3 (three) times daily as needed for dry eyes. Patient not taking: Reported on 01/05/2021    [provider]  lidocaine (XYLOCAINE) 5 % ointment Apply 1 application topically as needed. Patient not taking: Reported on 07/16/2018 02/01/18   Wendie Agreste, MD  nitroGLYCERIN (NITROSTAT) 0.4 MG SL tablet Place 1 tablet (0.4 mg total) under the tongue every 5 (five) minutes as needed for up to 40 doses for chest pain. Patient not taking: Reported on 01/05/2021 07/17/18   Cherylann Ratel A, DO  traMADol (ULTRAM) 50 MG tablet 1-2 tablet as needed 08/17/20   [provider]  VENTOLIN HFA 108 (90 Base) MCG/ACT inhaler INHALE 2 PUFFS INTO THE LUNGS EVERY 4 HOURS AS NEEDED FOR WHEEZING OR SHORTNESS OF BREATH Patient taking differently: Inhale 2 puffs into the lungs every 4 (four) hours as needed for wheezing or shortness of breath. 02/01/18  Wendie Agreste, MD    Current Outpatient Medications  Medication Sig Dispense Refill   budesonide-formoterol (SYMBICORT) 160-4.5 MCG/ACT inhaler Inhale 2 puffs into the lungs 2 (two) times daily. 10.2 g 11   famotidine (PEPCID) 40 MG tablet TAKE 1 TABLET(40 MG) BY MOUTH TWICE DAILY 60 tablet 3   fluticasone (FLONASE) 50 MCG/ACT nasal spray SHAKE LIQUID AND USE 2 SPRAYS IN EACH NOSTRIL AT BEDTIME 16 g 1   Turmeric 400 MG CAPS Take 400 mg by mouth daily.     amLODipine (NORVASC) 5 MG tablet Take 1 tablet (5 mg total) by mouth daily. (Patient not taking: Reported on  01/05/2021) 30 tablet 1   hydrALAZINE (APRESOLINE) 25 MG tablet Take 1 tablet (25 mg total) by mouth every 8 (eight) hours. (Patient not taking: Reported on 01/05/2021) 90 tablet 1   hydroxypropyl methylcellulose / hypromellose (ISOPTO TEARS / GONIOVISC) 2.5 % ophthalmic solution Place 1 drop into both eyes 3 (three) times daily as needed for dry eyes. (Patient not taking: Reported on 01/05/2021)     lidocaine (XYLOCAINE) 5 % ointment Apply 1 application topically as needed. (Patient not taking: Reported on 07/16/2018) 35.44 g 11   nitroGLYCERIN (NITROSTAT) 0.4 MG SL tablet Place 1 tablet (0.4 mg total) under the tongue every 5 (five) minutes as needed for up to 40 doses for chest pain. (Patient not taking: Reported on 01/05/2021) 20 tablet 1   traMADol (ULTRAM) 50 MG tablet 1-2 tablet as needed     VENTOLIN HFA 108 (90 Base) MCG/ACT inhaler INHALE 2 PUFFS INTO THE LUNGS EVERY 4 HOURS AS NEEDED FOR WHEEZING OR SHORTNESS OF BREATH (Patient taking differently: Inhale 2 puffs into the lungs every 4 (four) hours as needed for wheezing or shortness of breath.) 1 Inhaler 5   Current Facility-Administered Medications  Medication Dose Route Frequency Provider Last Rate Last Admin   0.9 %  sodium chloride infusion  500 mL Intravenous Once Layani Foronda, Lajuan Lines, MD        Allergies as of 01/21/2021 - Review Complete 01/21/2021  Allergen Reaction Noted   Tetanus toxoid     Asa [aspirin]  07/16/2018   Nsaids Diarrhea 08/14/2017   Other Other (See Comments) 08/14/2017   Tetanus toxoids  08/17/2020    Family History  Problem Relation Age of Onset   Colon polyps Mother    Heart attack Father 24       Congential Valvular Heart disease   Breast cancer Paternal Aunt    Diabetes Paternal Aunt    Colon cancer Paternal Uncle        X 2   Colon cancer Maternal Grandfather    Colon cancer Paternal Grandfather    Stroke Neg Hx    Stomach cancer Neg Hx    Esophageal cancer Neg Hx    Rectal cancer Neg Hx     Social  History   Socioeconomic History   Marital status: Married    Spouse name: terry   Number of children: Not on file   Years of education: Not on file   Highest education level: Not on file  Occupational History   Not on file  Tobacco Use   Smoking status: Never   Smokeless tobacco: Never   Tobacco comments:    Patient never smoked, around second hand smoke  Vaping Use   Vaping Use: Never used  Substance and Sexual Activity   Alcohol use: No    Alcohol/week: 0.0 standard drinks   Drug use: No  Sexual activity: Never  Other Topics Concern   Not on file  Social History Narrative   Not on file   Social Determinants of Health   Financial Resource Strain: Not on file  Food Insecurity: Not on file  Transportation Needs: Not on file  Physical Activity: Not on file  Stress: Not on file  Social Connections: Not on file  Intimate Partner Violence: Not on file    Physical Exam: Vital signs in last 24 hours: @BP  135/67    Pulse 84    Temp 98.4 F (36.9 C)    Ht 5\' 9"  (1.753 m)    Wt 300 lb (136.1 kg)    SpO2 95%    BMI 44.30 kg/m  GEN: NAD EYE: Sclerae anicteric ENT: MMM CV: Non-tachycardic Pulm: CTA b/l GI: Soft, NT/ND NEURO:  Alert & Oriented x 3   Zenovia Jarred, MD [facility]  01/21/2021 2:01 PM

## 2021-01-21 NOTE — Progress Notes (Signed)
D.T. vital signs. °

## 2021-01-21 NOTE — Progress Notes (Signed)
Called to room to assist during endoscopic procedure.  Patient ID and intended procedure confirmed with present staff. Received instructions for my participation in the procedure from the performing physician.  

## 2021-01-21 NOTE — Op Note (Signed)
[facility] Patient Name: Sheila Tucker Procedure Date: 01/21/2021 2:02 PM MRN: [id number] Endoscopist: Jerene Bears , MD Age: 71 Referring MD:  Date of Birth: Dec 28, 1950 Gender: Female Account #: [id number] Procedure:                Colonoscopy Indications:              High risk colon cancer surveillance: Personal                            history of non-advanced adenomas, Last colonoscopy:                            January 2016 Medicines:                Monitored Anesthesia Care Procedure:                Pre-Anesthesia Assessment:                           - Prior to the procedure, a History and Physical                            was performed, and patient medications and                            allergies were reviewed. The patient's tolerance of                            previous anesthesia was also reviewed. The risks                            and benefits of the procedure and the sedation                            options and risks were discussed with the patient.                            All questions were answered, and informed consent                            was obtained. Prior Anticoagulants: The patient has                            taken no previous anticoagulant or antiplatelet                            agents. ASA Grade Assessment: III - A patient with                            severe systemic disease. After reviewing the risks                            and benefits, the patient was deemed in  satisfactory condition to undergo the procedure.                           After obtaining informed consent, the colonoscope                            was passed under direct vision. Throughout the                            procedure, the patient's blood pressure, pulse, and                            oxygen saturations were monitored continuously. The                            PCF-HQ190L Colonoscope was introduced through the                             anus and advanced to the cecum, identified by                            appendiceal orifice and ileocecal valve. The                            colonoscopy was performed without difficulty. The                            patient tolerated the procedure well. The quality                            of the bowel preparation was good. The ileocecal                            valve, appendiceal orifice, and rectum were                            photographed. Scope In: 2:10:59 PM Scope Out: 2:37:04 PM Scope Withdrawal Time: 0 hours 20 minutes 14 seconds  Total Procedure Duration: 0 hours 26 minutes 5 seconds  Findings:                 The digital rectal exam was normal.                           A 7 mm polyp was found in the cecum. The polyp was                            sessile. The polyp was removed with a cold snare.                            Resection and retrieval were complete.                           Three sessile polyps were found in the ascending  colon. The polyps were 4 to 6 mm in size. These                            polyps were removed with a cold snare. Resection                            and retrieval were complete.                           A 10 mm polyp was found in the descending colon.                            The polyp was pedunculated. The polyp was removed                            with a hot snare. Resection and retrieval were                            complete.                           A 4 mm polyp was found in the descending colon. The                            polyp was sessile. The polyp was removed with a                            cold snare. Resection and retrieval were complete.                           A 5 mm polyp was found in the sigmoid colon. The                            polyp was sessile. The polyp was removed with a                            cold snare. Resection and retrieval were  complete.                           A 3 mm polyp was found in the rectum. The polyp was                            sessile. The polyp was removed with a cold snare.                            Resection and retrieval were complete.                           Multiple small and large-mouthed diverticula were                            found in the sigmoid colon, descending colon,  transverse colon and ascending colon.                           Internal hemorrhoids were found during                            retroflexion. The hemorrhoids were small. Complications:            No immediate complications. Estimated Blood Loss:     Estimated blood loss was minimal. Impression:               - One 7 mm polyp in the cecum, removed with a cold                            snare. Resected and retrieved.                           - Three 4 to 6 mm polyps in the ascending colon,                            removed with a cold snare. Resected and retrieved.                           - One 10 mm polyp in the descending colon, removed                            with a hot snare. Resected and retrieved.                           - One 4 mm polyp in the descending colon, removed                            with a cold snare. Resected and retrieved.                           - One 5 mm polyp in the sigmoid colon, removed with                            a cold snare. Resected and retrieved.                           - One 3 mm polyp in the rectum, removed with a cold                            snare. Resected and retrieved.                           - Moderate diverticulosis in the sigmoid colon, in                            the descending colon, in the transverse colon and  in the ascending colon.                           - Small internal hemorrhoids. Recommendation:           - Patient has a contact number available for                             emergencies. The signs and symptoms of potential                            delayed complications were discussed with the                            patient. Return to normal activities tomorrow.                            Written discharge instructions were provided to the                            patient.                           - Resume previous diet.                           - Continue present medications.                           - No ibuprofen, naproxen, or other non-steroidal                            anti-inflammatory drugs for 2 weeks after polyp                            removal.                           - Await pathology results.                           - Repeat colonoscopy may be recommended and timing                            to be determined after pending pathology results                            are reviewed for surveillance. Jerene Bears, MD 01/21/2021 2:42:17 PM This report has been signed electronically.

## 2021-01-21 NOTE — Progress Notes (Signed)
Pt's states no medical or surgical changes since previsit or office visit. 

## 2021-01-25 ENCOUNTER — Telehealth: Payer: Self-pay | Admitting: *Deleted

## 2021-01-25 NOTE — Telephone Encounter (Signed)
First follow up call, LVM

## 2021-01-26 ENCOUNTER — Encounter: Payer: Self-pay | Admitting: Internal Medicine

## 2021-08-17 DIAGNOSIS — I10 Essential (primary) hypertension: Secondary | ICD-10-CM | POA: Diagnosis not present

## 2021-08-17 DIAGNOSIS — E782 Mixed hyperlipidemia: Secondary | ICD-10-CM | POA: Diagnosis not present

## 2021-08-17 DIAGNOSIS — I700 Atherosclerosis of aorta: Secondary | ICD-10-CM | POA: Diagnosis not present

## 2021-08-20 DIAGNOSIS — M4125 Other idiopathic scoliosis, thoracolumbar region: Secondary | ICD-10-CM | POA: Diagnosis not present

## 2021-08-20 DIAGNOSIS — K219 Gastro-esophageal reflux disease without esophagitis: Secondary | ICD-10-CM | POA: Diagnosis not present

## 2021-08-20 DIAGNOSIS — J309 Allergic rhinitis, unspecified: Secondary | ICD-10-CM | POA: Diagnosis not present

## 2021-08-20 DIAGNOSIS — R600 Localized edema: Secondary | ICD-10-CM | POA: Diagnosis not present

## 2021-08-20 DIAGNOSIS — S98132A Complete traumatic amputation of one left lesser toe, initial encounter: Secondary | ICD-10-CM | POA: Diagnosis not present

## 2021-08-20 DIAGNOSIS — Z0000 Encounter for general adult medical examination without abnormal findings: Secondary | ICD-10-CM | POA: Diagnosis not present

## 2021-08-20 DIAGNOSIS — J4520 Mild intermittent asthma, uncomplicated: Secondary | ICD-10-CM | POA: Diagnosis not present

## 2021-08-20 DIAGNOSIS — I10 Essential (primary) hypertension: Secondary | ICD-10-CM | POA: Diagnosis not present

## 2021-08-20 DIAGNOSIS — R7303 Prediabetes: Secondary | ICD-10-CM | POA: Diagnosis not present

## 2021-08-20 DIAGNOSIS — Z66 Do not resuscitate: Secondary | ICD-10-CM | POA: Diagnosis not present

## 2021-08-20 DIAGNOSIS — I700 Atherosclerosis of aorta: Secondary | ICD-10-CM | POA: Diagnosis not present

## 2021-09-07 ENCOUNTER — Institutional Professional Consult (permissible substitution): Payer: Medicare Other | Admitting: Internal Medicine

## 2022-01-14 DIAGNOSIS — M79672 Pain in left foot: Secondary | ICD-10-CM | POA: Diagnosis not present

## 2022-01-14 DIAGNOSIS — M19072 Primary osteoarthritis, left ankle and foot: Secondary | ICD-10-CM | POA: Diagnosis not present

## 2022-01-14 DIAGNOSIS — M19079 Primary osteoarthritis, unspecified ankle and foot: Secondary | ICD-10-CM | POA: Diagnosis not present

## 2022-08-22 DIAGNOSIS — I10 Essential (primary) hypertension: Secondary | ICD-10-CM | POA: Diagnosis not present

## 2022-08-22 DIAGNOSIS — R7303 Prediabetes: Secondary | ICD-10-CM | POA: Diagnosis not present

## 2022-08-23 DIAGNOSIS — I10 Essential (primary) hypertension: Secondary | ICD-10-CM | POA: Diagnosis not present

## 2022-08-25 ENCOUNTER — Other Ambulatory Visit (HOSPITAL_COMMUNITY): Payer: Self-pay | Admitting: Internal Medicine

## 2022-08-25 DIAGNOSIS — K219 Gastro-esophageal reflux disease without esophagitis: Secondary | ICD-10-CM | POA: Diagnosis not present

## 2022-08-25 DIAGNOSIS — I700 Atherosclerosis of aorta: Secondary | ICD-10-CM | POA: Diagnosis not present

## 2022-08-25 DIAGNOSIS — G8929 Other chronic pain: Secondary | ICD-10-CM | POA: Diagnosis not present

## 2022-08-25 DIAGNOSIS — M545 Low back pain, unspecified: Secondary | ICD-10-CM | POA: Diagnosis not present

## 2022-08-25 DIAGNOSIS — J309 Allergic rhinitis, unspecified: Secondary | ICD-10-CM | POA: Diagnosis not present

## 2022-08-25 DIAGNOSIS — Z66 Do not resuscitate: Secondary | ICD-10-CM | POA: Diagnosis not present

## 2022-08-25 DIAGNOSIS — E782 Mixed hyperlipidemia: Secondary | ICD-10-CM

## 2022-08-25 DIAGNOSIS — J4520 Mild intermittent asthma, uncomplicated: Secondary | ICD-10-CM | POA: Diagnosis not present

## 2022-08-25 DIAGNOSIS — Z0000 Encounter for general adult medical examination without abnormal findings: Secondary | ICD-10-CM | POA: Diagnosis not present

## 2022-08-25 DIAGNOSIS — I10 Essential (primary) hypertension: Secondary | ICD-10-CM | POA: Diagnosis not present

## 2022-08-25 DIAGNOSIS — R7303 Prediabetes: Secondary | ICD-10-CM | POA: Diagnosis not present

## 2022-09-08 ENCOUNTER — Ambulatory Visit (HOSPITAL_COMMUNITY)
Admission: RE | Admit: 2022-09-08 | Discharge: 2022-09-08 | Disposition: A | Discharge status: Home / Self Care | Payer: Medicare Other | Source: Ambulatory Visit | Attending: Internal Medicine | Admitting: Internal Medicine

## 2022-09-08 DIAGNOSIS — E782 Mixed hyperlipidemia: Secondary | ICD-10-CM | POA: Insufficient documentation

## 2022-10-20 DIAGNOSIS — E7800 Pure hypercholesterolemia, unspecified: Secondary | ICD-10-CM | POA: Diagnosis not present

## 2022-10-20 DIAGNOSIS — M545 Low back pain, unspecified: Secondary | ICD-10-CM | POA: Diagnosis not present

## 2023-08-23 DIAGNOSIS — E782 Mixed hyperlipidemia: Secondary | ICD-10-CM | POA: Diagnosis not present

## 2023-08-23 DIAGNOSIS — I10 Essential (primary) hypertension: Secondary | ICD-10-CM | POA: Diagnosis not present

## 2023-08-23 DIAGNOSIS — R7303 Prediabetes: Secondary | ICD-10-CM | POA: Diagnosis not present

## 2023-08-28 DIAGNOSIS — S98132A Complete traumatic amputation of one left lesser toe, initial encounter: Secondary | ICD-10-CM | POA: Diagnosis not present

## 2023-08-28 DIAGNOSIS — J309 Allergic rhinitis, unspecified: Secondary | ICD-10-CM | POA: Diagnosis not present

## 2023-08-28 DIAGNOSIS — R7401 Elevation of levels of liver transaminase levels: Secondary | ICD-10-CM | POA: Diagnosis not present

## 2023-08-28 DIAGNOSIS — J4520 Mild intermittent asthma, uncomplicated: Secondary | ICD-10-CM | POA: Diagnosis not present

## 2023-08-28 DIAGNOSIS — Z66 Do not resuscitate: Secondary | ICD-10-CM | POA: Diagnosis not present

## 2023-08-28 DIAGNOSIS — K219 Gastro-esophageal reflux disease without esophagitis: Secondary | ICD-10-CM | POA: Diagnosis not present

## 2023-08-28 DIAGNOSIS — M6208 Separation of muscle (nontraumatic), other site: Secondary | ICD-10-CM | POA: Diagnosis not present

## 2023-08-28 DIAGNOSIS — Z0000 Encounter for general adult medical examination without abnormal findings: Secondary | ICD-10-CM | POA: Diagnosis not present

## 2023-08-28 DIAGNOSIS — D751 Secondary polycythemia: Secondary | ICD-10-CM | POA: Diagnosis not present

## 2023-08-28 DIAGNOSIS — M549 Dorsalgia, unspecified: Secondary | ICD-10-CM | POA: Diagnosis not present

## 2023-08-28 DIAGNOSIS — R7303 Prediabetes: Secondary | ICD-10-CM | POA: Diagnosis not present
